# Patient Record
Sex: Female | Born: 1955
Health system: Southern US, Community
[De-identification: ages and names within clinical notes are randomized; demographics above are authoritative.]

## PROBLEM LIST (undated history)

## (undated) DIAGNOSIS — I519 Heart disease, unspecified: Secondary | ICD-10-CM

## (undated) DIAGNOSIS — E785 Hyperlipidemia, unspecified: Secondary | ICD-10-CM

## (undated) DIAGNOSIS — E119 Type 2 diabetes mellitus without complications: Secondary | ICD-10-CM

## (undated) DIAGNOSIS — G8929 Other chronic pain: Secondary | ICD-10-CM

## (undated) DIAGNOSIS — M1990 Unspecified osteoarthritis, unspecified site: Secondary | ICD-10-CM

## (undated) DIAGNOSIS — C4499 Other specified malignant neoplasm of skin, unspecified: Secondary | ICD-10-CM

## (undated) DIAGNOSIS — D071 Carcinoma in situ of vulva: Secondary | ICD-10-CM

## (undated) DIAGNOSIS — M51369 Other intervertebral disc degeneration, lumbar region without mention of lumbar back pain or lower extremity pain: Secondary | ICD-10-CM

## (undated) DIAGNOSIS — I350 Nonrheumatic aortic (valve) stenosis: Secondary | ICD-10-CM

## (undated) DIAGNOSIS — Z87412 Personal history of vulvar dysplasia: Secondary | ICD-10-CM

## (undated) DIAGNOSIS — C519 Malignant neoplasm of vulva, unspecified: Secondary | ICD-10-CM

## (undated) DIAGNOSIS — D259 Leiomyoma of uterus, unspecified: Secondary | ICD-10-CM

## (undated) DIAGNOSIS — I10 Essential (primary) hypertension: Secondary | ICD-10-CM

## (undated) DIAGNOSIS — M5136 Other intervertebral disc degeneration, lumbar region: Secondary | ICD-10-CM

## (undated) HISTORY — PX: OTHER SURGICAL HISTORY: SHX169

---

## 2010-06-22 ENCOUNTER — Other Ambulatory Visit: Payer: Self-pay | Admitting: Internal Medicine

## 2010-06-22 DIAGNOSIS — Z1231 Encounter for screening mammogram for malignant neoplasm of breast: Secondary | ICD-10-CM

## 2010-06-29 ENCOUNTER — Ambulatory Visit
Admission: RE | Admit: 2010-06-29 | Discharge: 2010-06-29 | Disposition: A | Discharge status: Home / Self Care | Payer: Self-pay | Source: Ambulatory Visit | Attending: Internal Medicine | Admitting: Internal Medicine

## 2010-06-29 DIAGNOSIS — Z1231 Encounter for screening mammogram for malignant neoplasm of breast: Secondary | ICD-10-CM

## 2016-04-06 ENCOUNTER — Encounter: Payer: Self-pay | Admitting: Internal Medicine

## 2016-04-06 ENCOUNTER — Ambulatory Visit: Payer: Self-pay | Admitting: Internal Medicine

## 2016-04-06 ENCOUNTER — Ambulatory Visit: Payer: Self-pay | Attending: Internal Medicine | Admitting: Internal Medicine

## 2016-04-06 VITALS — BP 170/91 | HR 80 | Temp 98.4°F | Resp 16 | Wt 162.4 lb

## 2016-04-06 DIAGNOSIS — R102 Pelvic and perineal pain: Secondary | ICD-10-CM

## 2016-04-06 DIAGNOSIS — N765 Ulceration of vagina: Secondary | ICD-10-CM | POA: Insufficient documentation

## 2016-04-06 LAB — CBC WITH DIFFERENTIAL/PLATELET
BASOS PCT: 0 %
Basophils Absolute: 0 cells/uL (ref 0–200)
EOS PCT: 2 %
Eosinophils Absolute: 142 cells/uL (ref 15–500)
HCT: 40.8 % (ref 35.0–45.0)
HEMOGLOBIN: 12.5 g/dL (ref 11.7–15.5)
LYMPHS ABS: 3124 {cells}/uL (ref 850–3900)
LYMPHS PCT: 44 %
MCH: 23.9 pg — ABNORMAL LOW (ref 27.0–33.0)
MCHC: 30.6 g/dL — AB (ref 32.0–36.0)
MCV: 78.2 fL — ABNORMAL LOW (ref 80.0–100.0)
MPV: 10 fL (ref 7.5–12.5)
Monocytes Absolute: 426 cells/uL (ref 200–950)
Monocytes Relative: 6 %
NEUTROS PCT: 48 %
Neutro Abs: 3408 cells/uL (ref 1500–7800)
Platelets: 313 10*3/uL (ref 140–400)
RBC: 5.22 MIL/uL — AB (ref 3.80–5.10)
RDW: 14.9 % (ref 11.0–15.0)
WBC: 7.1 10*3/uL (ref 3.8–10.8)

## 2016-04-06 LAB — COMPLETE METABOLIC PANEL WITH GFR
ALT: 15 U/L (ref 6–29)
AST: 21 U/L (ref 10–35)
Albumin: 4.2 g/dL (ref 3.6–5.1)
Alkaline Phosphatase: 62 U/L (ref 33–130)
BUN: 8 mg/dL (ref 7–25)
CHLORIDE: 102 mmol/L (ref 98–110)
CO2: 31 mmol/L (ref 20–31)
CREATININE: 0.61 mg/dL (ref 0.50–0.99)
Calcium: 9.7 mg/dL (ref 8.6–10.4)
GFR, Est African American: >89 mL/min (ref >=60)
GFR, Est Non African American: >89 mL/min (ref >=60)
GLUCOSE: 95 mg/dL (ref 65–99)
POTASSIUM: 3.9 mmol/L (ref 3.5–5.3)
SODIUM: 141 mmol/L (ref 135–146)
Total Bilirubin: 0.5 mg/dL (ref 0.2–1.2)
Total Protein: 7.5 g/dL (ref 6.1–8.1)

## 2016-04-06 LAB — POCT GLYCOSYLATED HEMOGLOBIN (HGB A1C): HEMOGLOBIN A1C: 5.7

## 2016-04-06 LAB — TSH: TSH: 0.97 m[IU]/L

## 2016-04-06 LAB — LIPID PANEL
CHOL/HDL RATIO: 4.9 ratio (ref <5.0)
Cholesterol: 226 mg/dL — ABNORMAL HIGH (ref <200)
HDL: 46 mg/dL — ABNORMAL LOW (ref >50)
LDL CALC: 143 mg/dL — AB (ref <100)
Triglycerides: 184 mg/dL — ABNORMAL HIGH (ref <150)
VLDL: 37 mg/dL — AB (ref <30)

## 2016-04-06 MED ORDER — FLUCONAZOLE 200 MG PO TABS: 200.0000 mg | ORAL_TABLET | Freq: Once | ORAL | 1 refills | Status: AC

## 2016-04-06 MED ORDER — DOXYCYCLINE HYCLATE 100 MG PO TABS: 100.0000 mg | ORAL_TABLET | Freq: Two times a day (BID) | ORAL | 0 refills | Status: DC

## 2016-04-06 MED ORDER — ACYCLOVIR 5 % EX OINT: 1.0000 "application " | TOPICAL_OINTMENT | CUTANEOUS | 3 refills | Status: DC

## 2016-04-06 MED ORDER — ACYCLOVIR 400 MG PO TABS
400.0000 mg | ORAL_TABLET | Freq: Every day | ORAL | 0 refills | Status: DC
Start: 2016-04-06 — End: 2016-05-17

## 2016-04-06 MED ORDER — ACETAMINOPHEN-CODEINE #3 300-30 MG PO TABS
1.0000 | ORAL_TABLET | ORAL | 0 refills | Status: DC | PRN
Start: 2016-04-06 — End: 2016-04-22

## 2016-04-06 MED FILL — FLUCONAZOLE 200 MG TABLET: 200 | 1 days supply | Qty: 1 | Fill #0

## 2016-04-06 MED FILL — ?ACYCLOVIR 400 MG TABLET: 400 | 30 days supply | Qty: 70 | Fill #0

## 2016-04-06 MED FILL — ACETAMINOPHEN/COD #3 TABLET: 300-30 | 30 days supply | Qty: 60 | Fill #0

## 2016-04-06 MED FILL — ?DOXYCYCLINE MONO 100 MG TA: 100 | 20 days supply | Qty: 20 | Fill #0

## 2016-04-06 NOTE — Progress Notes (Signed)
Pt is in the office today for new patient visit Pt states she has a a sore on her vagina Pt states it is very painful Pt states her pain level is a 10 Pt states it has been 2 years  Pt states there is a little discharge

## 2016-04-06 NOTE — Progress Notes (Signed)
Ellen Ryan, is a 60 y.o. female  [id number]  [id number]  DOB - 1955/10/14  CC:  Chief Complaint  Patient presents with  . New Patient (Initial Visit)       HPI: Ellen Ryan is a 60 y.o. female with no significant medical history, arrived [place] from [place] few days ago to visit her daughter came here today to establish medical care. Accompanied by her daughter who lives here in [place] and a Designer, jewellery. Her major complain is severe pain, 10/10 around the vagina and vulva for over a year. Many tests and treatments carried out in [place] with no significant improvement. She said she had a biopsy done and was told she has vaginitis. Pain is so severe that she is scared to urinate because anything that touches causes intense pain. She has no bleeding, no significant vaginal discharge, no swelling or bumps, no inguinal fullness. No abdominal pain or distension, no fever at anytime, no night sweat, no weight loss. She is post menopausal. She was once treated with acyclovir but no relieve. Patient has No headache, No chest pain, No abdominal pain - No Nausea, No new weakness tingling or numbness, No Cough - SOB. She is not on any medication chronically. She does not smoke cigarette, no alcohol use.    Not on File No past medical history on file. No current outpatient prescriptions on file prior to visit.   No current facility-administered medications on file prior to visit.    No family history on file. Social History   Social History  . Marital status: Unknown    Spouse name: N/A  . Number of children: N/A  . Years of education: N/A   Occupational History  . Not on file.   Social History Main Topics  . Smoking status: Never Smoker  . Smokeless tobacco: Not on file  . Alcohol use Not on file  . Drug use: Unknown  . Sexual activity: Not on file   Other Topics Concern  . Not on file   Social History Narrative  . No narrative on file    Review of  Systems: Constitutional: Negative for fever, chills, diaphoresis, activity change, appetite change and fatigue. HENT: Negative for ear pain, nosebleeds, congestion, facial swelling, rhinorrhea, neck pain, neck stiffness and ear discharge.  Eyes: Negative for pain, discharge, redness, itching and visual disturbance. Respiratory: Negative for cough, choking, chest tightness, shortness of breath, wheezing and stridor.  Cardiovascular: Negative for chest pain, palpitations and leg swelling. Gastrointestinal: Negative for abdominal distention. Genitourinary: vulva and vaginal pain and ulcers Musculoskeletal: Negative for back pain, joint swelling, arthralgia and gait problem. Neurological: Negative for dizziness, tremors, seizures, syncope, facial asymmetry, speech difficulty, weakness, light-headedness, numbness and headaches.  Hematological: Negative for adenopathy. Does not bruise/bleed easily. Psychiatric/Behavioral: Negative for hallucinations, behavioral problems, confusion, dysphoric mood, decreased concentration and agitation.    Objective:   Vitals:   04/06/16 0918  BP: (!) 170/91  Pulse: 80  Resp: 16  Temp: 98.4 F (36.9 C)    Physical Exam: Constitutional: Patient appears well-developed and well-nourished. No distress. HENT: Normocephalic, atraumatic, External right and left ear normal. Oropharynx is clear and moist.  Eyes: Conjunctivae and EOM are normal. PERRLA, no scleral icterus. Neck: Normal ROM. Neck supple. No JVD. No tracheal deviation. No thyromegaly. CVS: RRR, S1/S2 +, no murmurs, no gallops, no carotid bruit.  Pulmonary: Effort and breath sounds normal, no stridor, rhonchi, wheezes, rales.  Abdominal: Soft. BS +, no distension, tenderness, rebound or guarding.  Musculoskeletal: Normal range of motion. No edema and no tenderness.  Lymphadenopathy: No lymphadenopathy noted, cervical, inguinal or axillary Neuro: Alert. Normal reflexes, muscle tone coordination. No  cranial nerve deficit. Skin: Skin is warm and dry. No rash noted. Not diaphoretic. No erythema. No pallor. Psychiatric: Normal mood and affect. Behavior, judgment, thought content normal.  Pelvic Exam: Female external genitalia, appears bumpy, exquisite tenderness even on light touch, impossible to do digital examination, vulva appeared to be adhesive to each other, when slightly opened, extensive ulceration noted on both sides internally, no discharge or active bleeding.      Assessment and plan:   1. Vaginal ulceration  - CT ABDOMEN PELVIS W WO CONTRAST; Future - CBC with Differential/Platelet - COMPLETE METABOLIC PANEL WITH GFR - POCT glycosylated hemoglobin (Hb A1C) - Lipid panel - TSH - Urinalysis, Complete - ANA - STD Panel (HBSAG,HIV,RPR) - Cervicovaginal ancillary only - acyclovir (ZOVIRAX) 400 MG tablet; Take 1 tablet (400 mg total) by mouth 5 (five) times daily.  Dispense: 70 tablet; Refill: 0 - acyclovir ointment (ZOVIRAX) 5 %; Apply 1 application topically every 4 (four) hours.  Dispense: 30 g; Refill: 3 - fluconazole (DIFLUCAN) 200 MG tablet; Take 1 tablet (200 mg total) by mouth once. Repeat in one week  Dispense: 1 tablet; Refill: 1 - doxycycline (VIBRA-TABS) 100 MG tablet; Take 1 tablet (100 mg total) by mouth 2 (two) times daily.  Dispense: 20 tablet; Refill: 0 - acetaminophen-codeine (TYLENOL #3) 300-30 MG tablet; Take 1 tablet by mouth every 4 (four) hours as needed.  Dispense: 60 tablet; Refill: 0  2. Vaginal pain  - CT ABDOMEN PELVIS W WO CONTRAST; Future - CBC with Differential/Platelet - COMPLETE METABOLIC PANEL WITH GFR - POCT glycosylated hemoglobin (Hb A1C) - Lipid panel - TSH - Urinalysis, Complete - ANA - STD Panel (HBSAG,HIV,RPR) - Cervicovaginal ancillary only - acyclovir (ZOVIRAX) 400 MG tablet; Take 1 tablet (400 mg total) by mouth 5 (five) times daily.  Dispense: 70 tablet; Refill: 0 - acyclovir ointment (ZOVIRAX) 5 %; Apply 1 application  topically every 4 (four) hours.  Dispense: 30 g; Refill: 3 - fluconazole (DIFLUCAN) 200 MG tablet; Take 1 tablet (200 mg total) by mouth once. Repeat in one week  Dispense: 1 tablet; Refill: 1 - doxycycline (VIBRA-TABS) 100 MG tablet; Take 1 tablet (100 mg total) by mouth 2 (two) times daily.  Dispense: 20 tablet; Refill: 0 - acetaminophen-codeine (TYLENOL #3) 300-30 MG tablet; Take 1 tablet by mouth every 4 (four) hours as needed.  Dispense: 60 tablet; Refill: 0   Return in about 4 weeks (around 05/04/2016) for Follow up Pain and Ulcers.  The patient was given clear instructions to go to ER or return to medical center if symptoms don't improve, worsen or new problems develop. The patient verbalized understanding. The patient was told to call to get lab results if they haven't heard anything in the next week.     This note has been created with Surveyor, quantity. Any transcriptional errors are unintentional.    Angelica Chessman, MD, [place], Karilyn C[place], [place], [place]   04/06/2016, 10:14 AM

## 2016-04-07 LAB — CERVICOVAGINAL ANCILLARY ONLY: Wet Prep (BD Affirm): NEGATIVE

## 2016-04-07 LAB — URINALYSIS, COMPLETE
Bacteria, UA: NONE SEEN [HPF]
Bilirubin Urine: NEGATIVE
CASTS: NONE SEEN [LPF]
CRYSTALS: NONE SEEN [HPF]
Glucose, UA: NEGATIVE
Hgb urine dipstick: NEGATIVE
KETONES UR: NEGATIVE
Nitrite: NEGATIVE
Protein, ur: NEGATIVE
SPECIFIC GRAVITY, URINE: 1.013 (ref 1.001–1.035)
YEAST: NONE SEEN [HPF]
pH: 6 (ref 5.0–8.0)

## 2016-04-07 LAB — STD PANEL
HIV: NONREACTIVE
Hepatitis B Surface Ag: NEGATIVE

## 2016-04-07 LAB — ANA: ANA: NEGATIVE

## 2016-04-10 LAB — REFLEX ADENOVIRUS CULTURE

## 2016-04-10 LAB — RFX HSV/VARICELLA ZOSTER RAPID CULT

## 2016-04-11 ENCOUNTER — Ambulatory Visit (HOSPITAL_COMMUNITY)
Admission: RE | Admit: 2016-04-11 | Discharge: 2016-04-11 | Disposition: A | Discharge status: Home / Self Care | Payer: Self-pay | Source: Ambulatory Visit | Attending: Internal Medicine | Admitting: Internal Medicine

## 2016-04-11 ENCOUNTER — Encounter (HOSPITAL_COMMUNITY): Payer: Self-pay

## 2016-04-11 DIAGNOSIS — R102 Pelvic and perineal pain: Secondary | ICD-10-CM | POA: Insufficient documentation

## 2016-04-11 DIAGNOSIS — D259 Leiomyoma of uterus, unspecified: Secondary | ICD-10-CM | POA: Insufficient documentation

## 2016-04-11 DIAGNOSIS — M5136 Other intervertebral disc degeneration, lumbar region: Secondary | ICD-10-CM | POA: Insufficient documentation

## 2016-04-11 DIAGNOSIS — N765 Ulceration of vagina: Secondary | ICD-10-CM | POA: Insufficient documentation

## 2016-04-11 MED ORDER — IOPAMIDOL (ISOVUE-300) INJECTION 61%
INTRAVENOUS | Status: AC
  Administered 2016-04-11: 100 mL
  Filled 2016-04-11: qty 75

## 2016-04-14 ENCOUNTER — Other Ambulatory Visit: Payer: Self-pay | Admitting: Internal Medicine

## 2016-04-14 DIAGNOSIS — N765 Ulceration of vagina: Secondary | ICD-10-CM

## 2016-04-14 DIAGNOSIS — R102 Pelvic and perineal pain: Secondary | ICD-10-CM

## 2016-04-14 MED ORDER — FLUCONAZOLE 200 MG PO TABS: ORAL_TABLET | ORAL | 0 refills | Status: DC

## 2016-04-14 MED ORDER — DOXYCYCLINE HYCLATE 100 MG PO TABS: 100.0000 mg | ORAL_TABLET | Freq: Two times a day (BID) | ORAL | 0 refills | Status: AC

## 2016-04-14 MED FILL — ?DOXYCYCLINE MONO 100 MG TA: 100 | 32 days supply | Qty: 64 | Fill #0

## 2016-04-14 MED FILL — FLUCONAZOLE 200 MG TABLET: 200 | 14 days supply | Qty: 2 | Fill #0

## 2016-04-16 LAB — CYTOMEGALOVIRUS CULTURE

## 2016-04-16 LAB — VIRAL CULTURE VIRC

## 2016-04-21 ENCOUNTER — Telehealth: Payer: Self-pay | Admitting: *Deleted

## 2016-04-21 NOTE — Telephone Encounter (Signed)
Patient verified DOB Patients daughter is aware of culture being negative including herpes. Patient advised to continue taking doxycycline and FU as scheduled. Patients daughter expressed her understanding and had no further questions at this time.

## 2016-04-21 NOTE — Telephone Encounter (Signed)
-----   Message from Tresa Garter, MD sent at 04/21/2016 10:31 AM EST ----- Please inform the patient that all viral culture are negative including herpes. Continue Doxycycline and follow up as scheduled

## 2016-04-22 ENCOUNTER — Other Ambulatory Visit: Payer: Self-pay | Admitting: Internal Medicine

## 2016-04-22 MED ORDER — CLOBETASOL PROPIONATE 0.05 % EX OINT: 1.0000 "application " | TOPICAL_OINTMENT | Freq: Two times a day (BID) | CUTANEOUS | 0 refills | Status: DC

## 2016-04-22 MED ORDER — PREDNISONE 10 MG (48) PO TBPK: ORAL_TABLET | Freq: Every day | ORAL | 0 refills | Status: DC

## 2016-04-22 MED ORDER — ACETAMINOPHEN-CODEINE #3 300-30 MG PO TABS: 1.0000 | ORAL_TABLET | ORAL | 0 refills | Status: DC | PRN

## 2016-04-22 NOTE — Addendum Note (Signed)
Addended by: Angelica Chessman E on: 04/22/2016 03:51 PM   Modules accepted: Orders

## 2016-05-10 ENCOUNTER — Ambulatory Visit: Payer: Self-pay | Admitting: Internal Medicine

## 2016-05-11 ENCOUNTER — Encounter (HOSPITAL_COMMUNITY): Payer: Self-pay | Admitting: Emergency Medicine

## 2016-05-11 ENCOUNTER — Emergency Department (HOSPITAL_COMMUNITY)
Admission: EM | Admit: 2016-05-11 | Discharge: 2016-05-11 | Disposition: A | Discharge status: Home / Self Care | Payer: Self-pay | Attending: Emergency Medicine | Admitting: Emergency Medicine

## 2016-05-11 DIAGNOSIS — N765 Ulceration of vagina: Secondary | ICD-10-CM | POA: Insufficient documentation

## 2016-05-11 HISTORY — DX: Hyperlipidemia, unspecified: E78.5

## 2016-05-11 LAB — URINALYSIS, ROUTINE W REFLEX MICROSCOPIC
BACTERIA UA: NONE SEEN
BILIRUBIN URINE: NEGATIVE
Glucose, UA: NEGATIVE mg/dL
HGB URINE DIPSTICK: NEGATIVE
KETONES UR: NEGATIVE mg/dL
NITRITE: NEGATIVE
Protein, ur: NEGATIVE mg/dL
SQUAMOUS EPITHELIAL / LPF: NONE SEEN
Specific Gravity, Urine: 1.009 (ref 1.005–1.030)
pH: 5 (ref 5.0–8.0)

## 2016-05-11 MED ORDER — OXYCODONE HCL 5 MG PO TABS
5.0000 mg | ORAL_TABLET | Freq: Once | ORAL | Status: AC
  Administered 2016-05-11: 5 mg via ORAL
  Filled 2016-05-11: qty 1

## 2016-05-11 MED ORDER — ACETAMINOPHEN 500 MG PO TABS
1000.0000 mg | ORAL_TABLET | Freq: Once | ORAL | Status: AC
  Administered 2016-05-11: 1000 mg via ORAL
  Filled 2016-05-11: qty 2

## 2016-05-11 MED ORDER — KETOROLAC TROMETHAMINE 60 MG/2ML IM SOLN
30.0000 mg | Freq: Once | INTRAMUSCULAR | Status: AC
  Administered 2016-05-11: 30 mg via INTRAMUSCULAR
  Filled 2016-05-11: qty 2

## 2016-05-11 NOTE — ED Provider Notes (Signed)
[facility] DEPT Provider Note   CSN: [id number] Arrival date & time: 05/11/16  1434     History   Chief Complaint Chief Complaint  Patient presents with  . Vaginal Pain  . Dysuria    HPI Ellen Ryan is a 61 y.o. female.  61 yo F with a chief complaints of vaginal pain. This been going on for at least 2 years. Family thinks it's worsening over the past week or so. She recently saw a family physician had a CT scan of the abdomen and pelvis with contrast that was negative. Had labs drawn with no significant abnormality. Per the patient in [place] she had been evaluated and had a biopsy done and they decided that it was caused by bacteria. They said that she had vaginitis. Family feels that the rash is worsening and now spreading to her perineum.    The history is provided by the patient and a relative. The history is limited by a language barrier.  Vaginal Pain  This is a chronic problem. The current episode started more than 1 week ago (2 years ago). The problem occurs constantly. The problem has been gradually worsening. Pertinent negatives include no chest pain, no headaches and no shortness of breath. Nothing aggravates the symptoms. Nothing relieves the symptoms. She has tried nothing for the symptoms. The treatment provided no relief.  Dysuria   Pertinent negatives include no chills, no nausea, no vomiting and no urgency.    Past Medical History:  Diagnosis Date  . Hyperlipidemia     Patient Active Problem List   Diagnosis Date Noted  . Vaginal ulceration 04/06/2016    History reviewed. No pertinent surgical history.  OB History    No data available       Home Medications    Prior to Admission medications   Medication Sig Start Date End Date Taking? Authorizing Provider  acetaminophen-codeine (TYLENOL #3) 300-30 MG tablet Take 1 tablet by mouth every 4 (four) hours as needed. 04/22/16  Yes Tresa Garter, MD  doxycycline (VIBRA-TABS) 100 MG tablet  Take 1 tablet (100 mg total) by mouth 2 (two) times daily. 04/14/16 05/16/16 Yes Tresa Garter, MD  ibuprofen (ADVIL,MOTRIN) 200 MG tablet Take 600 mg by mouth every 6 (six) hours as needed.   Yes Historical Provider, MD  acyclovir (ZOVIRAX) 400 MG tablet Take 1 tablet (400 mg total) by mouth 5 (five) times daily. Patient not taking: Reported on 05/11/2016 04/06/16   Tresa Garter, MD  acyclovir ointment (ZOVIRAX) 5 % Apply 1 application topically every 4 (four) hours. Patient not taking: Reported on 05/11/2016 04/06/16   Tresa Garter, MD  clobetasol ointment (TEMOVATE) [id number] % Apply 1 application topically 2 (two) times daily. Patient not taking: Reported on 05/11/2016 04/22/16   Tresa Garter, MD  fluconazole (DIFLUCAN) 200 MG tablet Take 1 tablet today, repeat in one two weeks Patient not taking: Reported on 05/11/2016 04/14/16   Tresa Garter, MD  predniSONE (STERAPRED UNI-PAK 48 TAB) 10 MG (48) TBPK tablet Take by mouth daily. 6 tabs daily x 3 days, 4 tabs daily x 3 days, 3 tablets daily for 3 days, then 2 tabs for 3 days then 1 tab daily Patient not taking: Reported on 05/11/2016 04/22/16   Tresa Garter, MD    Family History History reviewed. No pertinent family history.  Social History Social History  Substance Use Topics  . Smoking status: Never Smoker  . Smokeless tobacco: Never Used  . Alcohol  use No     Allergies   Patient has no known allergies.   Review of Systems Review of Systems  Constitutional: Negative for chills and fever.  HENT: Negative for congestion and rhinorrhea.   Eyes: Negative for redness and visual disturbance.  Respiratory: Negative for shortness of breath and wheezing.   Cardiovascular: Negative for chest pain and palpitations.  Gastrointestinal: Negative for nausea and vomiting.  Genitourinary: Positive for pelvic pain and vaginal pain. Negative for dysuria, urgency, vaginal bleeding and vaginal discharge.    Musculoskeletal: Negative for arthralgias and myalgias.  Skin: Negative for pallor and wound.  Neurological: Negative for dizziness and headaches.     Physical Exam Updated Vital Signs BP 181/94   Pulse 107   Temp 98.2 F (36.8 C) (Oral)   Resp 16   Ht 5' (1.524 m)   Wt 154 lb 5.2 oz (70 kg)   SpO2 96%   BMI 30.14 kg/m   Physical Exam  Constitutional: She is oriented to person, place, and time. She appears well-developed and well-nourished. No distress.  HENT:  Head: Normocephalic and atraumatic.  Eyes: EOM are normal. Pupils are equal, round, and reactive to light.  Neck: Normal range of motion. Neck supple.  Cardiovascular: Normal rate and regular rhythm.  Exam reveals no gallop and no friction rub.   No murmur heard. Pulmonary/Chest: Effort normal. She has no wheezes. She has no rales.  Abdominal: Soft. She exhibits no distension. There is no tenderness.  Genitourinary:  Genitourinary Comments: Diffuse vaginal ulceration, unable to insert speculum due to pain.   Musculoskeletal: She exhibits no edema or tenderness.  Neurological: She is alert and oriented to person, place, and time.  Skin: Skin is warm and dry. She is not diaphoretic.  Psychiatric: She has a normal mood and affect. Her behavior is normal.  Nursing note and vitals reviewed.    ED Treatments / Results  Labs (all labs ordered are listed, but only abnormal results are displayed) Labs Reviewed  URINALYSIS, ROUTINE W REFLEX MICROSCOPIC - Abnormal; Notable for the following:       Result Value   Leukocytes, UA TRACE (*)    All other components within normal limits  WET PREP, GENITAL  GC/CHLAMYDIA PROBE AMP ([place]) NOT AT [facility]    EKG  EKG Interpretation None       Radiology No results found.  Procedures Procedures (including critical care time)  Medications Ordered in ED Medications  ketorolac (TORADOL) injection 30 mg (not administered)  acetaminophen (TYLENOL) tablet 1,000 mg  (not administered)  oxyCODONE (Oxy IR/ROXICODONE) immediate release tablet 5 mg (not administered)     Initial Impression / Assessment and Plan / ED Course  I have reviewed the triage vital signs and the nursing notes.  Pertinent labs & imaging results that were available during my care of the patient were reviewed by me and considered in my medical decision making (see chart for details).     61 yo F With chronic vaginal pain. Patient had a recent CT that was normal. Diffuse eroded ulcerations. Spares the rectum and the mucus numbering the mouth. Was recently on doxycycline. This been going on for 2 years I doubt Stevens-Johnson's. We'll have the patient follow-up with OB/GYN.  5:40 PM:  I have discussed the diagnosis/risks/treatment options with the patient and family and believe the pt to be eligible for discharge home to follow-up with OB/GYN. We also discussed returning to the ED immediately if new or worsening sx occur. We  discussed the sx which are most concerning (e.g., sudden worsening pain, fever, inability to tolerate by mouth ) that necessitate immediate return. Medications administered to the patient during their visit and any new prescriptions provided to the patient are listed below.  Medications given during this visit Medications  ketorolac (TORADOL) injection 30 mg (not administered)  acetaminophen (TYLENOL) tablet 1,000 mg (not administered)  oxyCODONE (Oxy IR/ROXICODONE) immediate release tablet 5 mg (not administered)     The patient appears reasonably screen and/or stabilized for discharge and I doubt any other medical condition or other [facility] requiring further screening, evaluation, or treatment in the ED at this time prior to discharge.    Final Clinical Impressions(s) / ED Diagnoses   Final diagnoses:  Vaginal ulceration    New Prescriptions New Prescriptions   No medications on file     Deno Etienne, DO 05/11/16 1740

## 2016-05-11 NOTE — ED Triage Notes (Addendum)
Pt reports vaginal pain for the past 3 weeks. Went to doctor for same a few weeks ago and was given prescription for abx. However pain and swelling is spreading to perianal area now. Pt has also had dysuria.

## 2016-05-11 NOTE — ED Notes (Signed)
Patient would not allow Dr. Tyrone Nine to perform pelvic exam due to pain.

## 2016-05-11 NOTE — Discharge Instructions (Signed)
Take 4 over the counter ibuprofen tablets 3 times a day or 2 over-the-counter naproxen tablets twice a day for pain. Also take tylenol 1000mg(2 extra strength) four times a day.    

## 2016-05-17 ENCOUNTER — Ambulatory Visit: Payer: Self-pay | Attending: Internal Medicine | Admitting: Internal Medicine

## 2016-05-17 VITALS — BP 177/97 | HR 82 | Temp 97.7°F | Resp 18 | Ht 60.0 in | Wt 167.6 lb

## 2016-05-17 DIAGNOSIS — R102 Pelvic and perineal pain: Secondary | ICD-10-CM | POA: Insufficient documentation

## 2016-05-17 DIAGNOSIS — E785 Hyperlipidemia, unspecified: Secondary | ICD-10-CM | POA: Insufficient documentation

## 2016-05-17 DIAGNOSIS — Z79899 Other long term (current) drug therapy: Secondary | ICD-10-CM | POA: Insufficient documentation

## 2016-05-17 DIAGNOSIS — I10 Essential (primary) hypertension: Secondary | ICD-10-CM | POA: Insufficient documentation

## 2016-05-17 DIAGNOSIS — N765 Ulceration of vagina: Secondary | ICD-10-CM | POA: Insufficient documentation

## 2016-05-17 MED ORDER — MELOXICAM 15 MG PO TABS
15.0000 mg | ORAL_TABLET | Freq: Every day | ORAL | 0 refills | Status: DC
Start: 2016-05-17 — End: 2016-05-17

## 2016-05-17 MED ORDER — MELOXICAM 15 MG PO TABS: 15.0000 mg | ORAL_TABLET | Freq: Every day | ORAL | 3 refills | Status: DC

## 2016-05-17 NOTE — Progress Notes (Signed)
Ellen Ryan, is a 61 y.o. female  [id number]  [id number]  DOB - Apr 13, 1956  Chief Complaint  Patient presents with  . Hypertension      Subjective:   Ellen Ryan is a 61 y.o. female here today for a follow up visit of vaginal pain and ED visit follow up. She complains of ongoing severe vaginal pain and ulcer which has been going on for years. Has been treated with rounds of antibiotics, treated for herpes, treated for fungal infection and lately was given a tapered course of steroid with presumptive diagnosis of Behcet's Disease. There was no sustained relieve. Denies vaginal bleeding. No discharge. Pain is worse when urine touches. Patient has No headache, No chest pain, No abdominal pain - No Nausea, No new weakness tingling or numbness, No Cough - SOB. She was never hypertensive, not on any chronic medications. BP at home said to be normal per daughter who accompanied patient during this visit and a DNP student at [employer].  Problem  Vaginal Pain  Essential Hypertension    ALLERGIES: No Known Allergies  PAST MEDICAL HISTORY: Past Medical History:  Diagnosis Date  . Hyperlipidemia     MEDICATIONS AT HOME: Prior to Admission medications   Medication Sig Start Date End Date Taking? Authorizing Provider  acetaminophen-codeine (TYLENOL #3) 300-30 MG tablet Take 1 tablet by mouth every 4 (four) hours as needed. 04/22/16  Yes Tresa Garter, MD  ibuprofen (ADVIL,MOTRIN) 200 MG tablet Take 600 mg by mouth every 6 (six) hours as needed.   Yes Historical Provider, MD  meloxicam (MOBIC) 15 MG tablet Take 1 tablet (15 mg total) by mouth daily. 05/17/16  Yes Tresa Garter, MD    Objective:   Vitals:   05/17/16 1653  BP: (!) 177/97  Pulse: 82  Resp: 18  Temp: 97.7 F (36.5 C)  TempSrc: Oral  SpO2: 100%  Weight: 167 lb 9.6 oz (76 kg)  Height: 5' (1.524 m)   Exam General appearance : Awake, alert, not in any distress. Speech Clear. Not toxic  looking HEENT: Atraumatic and Normocephalic, pupils equally reactive to light and accomodation Neck: Supple, no JVD. No cervical lymphadenopathy.  Chest: Good air entry bilaterally, no added sounds  CVS: S1 S2 regular, no murmurs.  Abdomen: Bowel sounds present, Non tender and not distended with no gaurding, rigidity or rebound. Extremities: B/L Lower Ext shows no edema, both legs are warm to touch Neurology: Awake alert, and oriented X 3, CN II-XII intact, Non focal Skin: No Rash  Data Review Lab Results  Component Value Date   HGBA1C 5.7 04/06/2016    Assessment & Plan   1. Vaginal ulceration  - Ambulatory referral to Gynecology - meloxicam (MOBIC) 15 MG tablet; Take 1 tablet (15 mg total) by mouth daily.  Dispense: 30 tablet; Refill: 3  2. Vaginal pain  - Ambulatory referral to Gynecology - meloxicam (MOBIC) 15 MG tablet; Take 1 tablet (15 mg total) by mouth daily.  Dispense: 30 tablet; Refill: 3  3. Essential hypertension: Patient is not known to have HTN  We have discussed target BP range and blood pressure goal. I have advised patient to check BP regularly and to call us back or report to clinic if the numbers are consistently higher than 140/90. We discussed the importance of compliance with medical therapy and DASH diet recommended, consequences of uncontrolled hypertension discussed.   Patient have been counseled extensively about nutrition and exercise. Other issues discussed during this visit include: low cholesterol  diet, weight control and daily exercise, foot care, annual eye examinations at Ophthalmology, importance of adherence with medications and regular follow-up. We also discussed long term complications of uncontrolled diabetes and hypertension.   Return in about 3 months (around 08/15/2016) for Routine Follow Up.  The patient was given clear instructions to go to ER or return to medical center if symptoms don't improve, worsen or new problems develop. The  patient verbalized understanding. The patient was told to call to get lab results if they haven't heard anything in the next week.   This note has been created with Surveyor, quantity. Any transcriptional errors are unintentional.    Angelica Chessman, MD, [place], [place], [place], [place] and [place], [place]   05/17/2016, 7:13 PM

## 2016-05-26 ENCOUNTER — Encounter: Payer: Self-pay | Admitting: Obstetrics & Gynecology

## 2016-05-31 ENCOUNTER — Other Ambulatory Visit: Payer: Self-pay | Admitting: Internal Medicine

## 2016-05-31 MED ORDER — AMLODIPINE BESYLATE 5 MG PO TABS: 5.0000 mg | ORAL_TABLET | Freq: Every day | ORAL | 3 refills | Status: DC

## 2016-05-31 MED ORDER — BETAMETHASONE DIPROPIONATE 0.05 % EX CREA: TOPICAL_CREAM | Freq: Two times a day (BID) | CUTANEOUS | 0 refills | Status: DC

## 2016-05-31 MED FILL — ?AMLODIPINE BESYLATE 5 MG T: 5 | 30 days supply | Qty: 30 | Fill #0

## 2016-05-31 MED FILL — BETAMETHASONE DP 0.05% CRM: 0.05 | 30 days supply | Qty: 45 | Fill #0

## 2016-06-23 ENCOUNTER — Other Ambulatory Visit: Payer: Self-pay | Admitting: Internal Medicine

## 2016-06-23 MED ORDER — AMLODIPINE BESYLATE 10 MG PO TABS: 10.0000 mg | ORAL_TABLET | Freq: Every day | ORAL | 3 refills | Status: DC

## 2016-06-30 ENCOUNTER — Ambulatory Visit (INDEPENDENT_AMBULATORY_CARE_PROVIDER_SITE_OTHER): Payer: Self-pay | Admitting: Obstetrics & Gynecology

## 2016-06-30 VITALS — BP 167/100 | HR 120 | Wt 172.0 lb

## 2016-06-30 DIAGNOSIS — N766 Ulceration of vulva: Secondary | ICD-10-CM

## 2016-06-30 MED ORDER — LIDOCAINE HCL 2 % EX GEL
1.0000 | CUTANEOUS | 2 refills | Status: DC | PRN
Start: 2016-06-30 — End: 2016-12-27

## 2016-06-30 MED ORDER — OXYBUTYNIN CHLORIDE ER 5 MG PO TB24: 5.0000 mg | ORAL_TABLET | Freq: Every day | ORAL | 12 refills | Status: DC

## 2016-06-30 MED ORDER — OXYCODONE-ACETAMINOPHEN 5-325 MG PO TABS: 1.0000 | ORAL_TABLET | Freq: Four times a day (QID) | ORAL | 0 refills | Status: DC | PRN

## 2016-06-30 NOTE — Progress Notes (Signed)
   Subjective:    Patient ID: Ellen Ryan, female    DOB: 01-14-1956, 61 y.o.   MRN: [id number]  HPI 61 yo W [place] P6 (all vaginal deliveries) here with a 61 year h/o severe vulvar pain and ulceration. She had a biopsy in [place] and told that it was not malignant. She was treated for "vaginitis" there with cipro and rocephin as well as oral steroids. She has a large amount of urirnary urgency and frequency. In a 2 hour movie, she will have to pee about 10 times. She gets up about 3 times per night to void.   Review of Systems     Objective:   Physical Exam Pleasant [id number] Severe ulceration of entire inner vulva, urine visualized leaking with exam She was unable to open legs more than a very small amount for a short amount of time. In [place] they used general/sedation anesthesia to get her biopsy.       Assessment & Plan:  I feel that this is ulceration due to chronic wetness I will start ditropan but if this doesn't help then she will need a urologist I will prescribe lidocaine ointment and very sparingly she can use percocet #30 with RFs RTC 4 weeks

## 2016-07-14 ENCOUNTER — Encounter: Payer: Self-pay | Admitting: Obstetrics & Gynecology

## 2016-07-19 ENCOUNTER — Other Ambulatory Visit: Payer: Self-pay | Admitting: Internal Medicine

## 2016-07-19 MED ORDER — OXYCODONE-ACETAMINOPHEN 5-325 MG PO TABS: 1.0000 | ORAL_TABLET | Freq: Four times a day (QID) | ORAL | 0 refills | Status: DC | PRN

## 2016-07-24 ENCOUNTER — Ambulatory Visit: Payer: Self-pay | Admitting: Obstetrics & Gynecology

## 2016-07-26 ENCOUNTER — Ambulatory Visit (INDEPENDENT_AMBULATORY_CARE_PROVIDER_SITE_OTHER): Payer: Self-pay | Admitting: Obstetrics & Gynecology

## 2016-07-26 VITALS — BP 159/76 | HR 102 | Wt 171.0 lb

## 2016-07-26 DIAGNOSIS — N766 Ulceration of vulva: Secondary | ICD-10-CM

## 2016-07-26 MED ORDER — OXYBUTYNIN CHLORIDE ER 10 MG PO TB24: 10.0000 mg | ORAL_TABLET | Freq: Every day | ORAL | 12 refills | Status: DC

## 2016-07-26 MED ORDER — ESTRADIOL 0.1 MG/GM VA CREA
TOPICAL_CREAM | VAGINAL | 12 refills | Status: DC
Start: 2016-07-26 — End: 2016-07-28

## 2016-07-26 NOTE — Progress Notes (Signed)
   Subjective:    Patient ID: Ellen Ryan, female    DOB: 1955-06-29, 61 y.o.   MRN: [id number]  HPI 61 yo [place] P6 here for follow up her vulvar ulceration. I started her on ditropan 5 mg. She started to feel better after a few weeks. She only has to get up 1 time per night to void. She only has to void a few times during the day. She increased her ditropan to 10 mg last week and has has no side effects.   Review of Systems     Objective:   Physical Exam She is now able to open her legs.  Her labia majora appear normal The ulceration is greatly improved. However the inner labia still have some ulceration. Urine is no longer obvious on the outside of the vagina.       Assessment & Plan:  Improving ulceration. Rec A&D ointment to provide a moisture barrier. Rec vaginal estrogen cream, generic estrace prescribed If no better in 2 months, then refer to Dr. Maryland Pink

## 2016-07-28 ENCOUNTER — Telehealth: Payer: Self-pay | Admitting: General Practice

## 2016-07-28 MED ORDER — ESTRADIOL 0.1 MG/GM VA CREA: TOPICAL_CREAM | VAGINAL | 12 refills | Status: DC

## 2016-07-28 NOTE — Telephone Encounter (Signed)
Patient's daughter called and left message stating the prescription for estrace is not at her mother's [place]. Per chart review, medication didn't go through electronically. Resubmitted. Called patient's daughter and informed her of Rx sent to pharmacy. She verbalized understanding & had no questions

## 2016-08-22 ENCOUNTER — Telehealth: Payer: Self-pay | Admitting: *Deleted

## 2016-08-22 NOTE — Telephone Encounter (Signed)
Called patient 3 times and line was busy. Will attempt to call patient back later

## 2016-08-22 NOTE — Telephone Encounter (Signed)
Talked to patient daughter Jari Favre, she stated mother condition was not getting better and was doing everything Dr.Dove told her to do at home with ointment and medication. Our office was able to work her in to see Dr. Hulan Fray tomorrow at 2:00 pm.

## 2016-08-22 NOTE — Telephone Encounter (Signed)
Per left message yesterday stating that her mother's condition is getting worse since starting the Estrogen cream and she wants Dr. Hulan Fray to know. The pain is more severe and is not relieved by the Oxycodone. She wants to know if she can get an earlier appt for her mother.

## 2016-08-23 ENCOUNTER — Encounter (HOSPITAL_COMMUNITY): Payer: Self-pay

## 2016-08-23 ENCOUNTER — Ambulatory Visit (INDEPENDENT_AMBULATORY_CARE_PROVIDER_SITE_OTHER): Payer: Self-pay | Admitting: Obstetrics & Gynecology

## 2016-08-23 VITALS — BP 153/92 | HR 100 | Wt 173.0 lb

## 2016-08-23 DIAGNOSIS — N766 Ulceration of vulva: Secondary | ICD-10-CM

## 2016-08-23 MED ORDER — TRAMADOL HCL 50 MG PO TABS: 50.0000 mg | ORAL_TABLET | Freq: Four times a day (QID) | ORAL | 0 refills | Status: DC | PRN

## 2016-08-23 NOTE — Progress Notes (Signed)
   Subjective:    Patient ID: Ellen Ryan, female    DOB: [birth year], 61 y.o.   MRN: [id number]  HPI 61 yo [place] lady here because she feels that the burning pain of her vulva got much worse after using the estrace cream. She stopped using it about a week ago. It is still super painful. She has used a total of #90 percocet in the last month and is out. She is still using the A&D ointment as well as the lidocaine.   Review of Systems     Objective:   Physical Exam        Assessment & Plan:  I will take her to the OR for a biopsy and then decide if I should refer her to Dr. Daniel Nones (gyn derm) versus Dr. [place] Pink Tramadol #60. 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

## 2016-08-29 NOTE — Patient Instructions (Signed)
Your procedure is scheduled on:  Friday, Sep 01, 2016  Enter through the [employer] [facility] at:  10:30AM  Pick up the phone at the desk and dial [phone].  Call this number if you have problems the morning of surgery: [phone].  Remember: Do NOT eat food:  After Midnight Thursday  Do NOT drink clear liquids after:  8:00 AM day of surgery  Take these medicines the morning of surgery with a SIP OF WATER:  Amlodipine  Stop ALL herbal medications and fish oil at this time  Do NOT smoke the day of surgery.  Do NOT wear jewelry (body piercing), metal hair clips/bobby pins, make-up, or nail polish. Do NOT wear lotions, powders, or perfumes.  You may wear deodorant. Do NOT shave for 48 hours prior to surgery. Do NOT bring valuables to the hospital. Contacts, dentures, or bridgework may not be worn into surgery.  Have a responsible adult drive you home and stay with you for 24 hours after your procedure  Bring a copy of your healthcare power of attorney and living will documents.

## 2016-08-30 ENCOUNTER — Encounter (HOSPITAL_COMMUNITY): Payer: Self-pay

## 2016-08-30 ENCOUNTER — Other Ambulatory Visit: Payer: Self-pay

## 2016-08-30 ENCOUNTER — Encounter (HOSPITAL_COMMUNITY)
Admission: RE | Admit: 2016-08-30 | Discharge: 2016-08-30 | Disposition: A | Discharge status: Home / Self Care | Payer: Self-pay | Source: Ambulatory Visit | Attending: Obstetrics & Gynecology | Admitting: Obstetrics & Gynecology

## 2016-08-30 DIAGNOSIS — Z01810 Encounter for preprocedural cardiovascular examination: Secondary | ICD-10-CM | POA: Insufficient documentation

## 2016-08-30 DIAGNOSIS — R000 Tachycardia, unspecified: Secondary | ICD-10-CM | POA: Insufficient documentation

## 2016-08-30 DIAGNOSIS — R102 Pelvic and perineal pain: Secondary | ICD-10-CM | POA: Insufficient documentation

## 2016-08-30 DIAGNOSIS — Z01818 Encounter for other preprocedural examination: Secondary | ICD-10-CM | POA: Insufficient documentation

## 2016-08-30 HISTORY — DX: Unspecified osteoarthritis, unspecified site: M19.90

## 2016-08-30 HISTORY — DX: Essential (primary) hypertension: I10

## 2016-08-30 LAB — BASIC METABOLIC PANEL
Anion gap: 10 (ref 5–15)
BUN: 12 mg/dL (ref 6–20)
CHLORIDE: 98 mmol/L — AB (ref 101–111)
CO2: 26 mmol/L (ref 22–32)
CREATININE: 0.6 mg/dL (ref 0.44–1.00)
Calcium: 9.7 mg/dL (ref 8.9–10.3)
GFR calc non Af Amer: >60 mL/min (ref >60)
Glucose, Bld: 176 mg/dL — ABNORMAL HIGH (ref 65–99)
POTASSIUM: 4 mmol/L (ref 3.5–5.1)
SODIUM: 134 mmol/L — AB (ref 135–145)

## 2016-08-30 LAB — CBC
HCT: 39 % (ref 36.0–46.0)
Hemoglobin: 12.6 g/dL (ref 12.0–15.0)
MCH: 23.7 pg — ABNORMAL LOW (ref 26.0–34.0)
MCHC: 32.3 g/dL (ref 30.0–36.0)
MCV: 73.4 fL — AB (ref 78.0–100.0)
PLATELETS: 292 10*3/uL (ref 150–400)
RBC: 5.31 MIL/uL — AB (ref 3.87–5.11)
RDW: 15 % (ref 11.5–15.5)
WBC: 9.1 10*3/uL (ref 4.0–10.5)

## 2016-09-01 ENCOUNTER — Ambulatory Visit (HOSPITAL_COMMUNITY): Payer: Self-pay | Admitting: Anesthesiology

## 2016-09-01 ENCOUNTER — Encounter (HOSPITAL_COMMUNITY)
Admission: RE | Disposition: A | Discharge status: Home / Self Care | Payer: Self-pay | Source: Ambulatory Visit | Attending: Obstetrics & Gynecology

## 2016-09-01 ENCOUNTER — Ambulatory Visit (HOSPITAL_COMMUNITY)
Admission: RE | Admit: 2016-09-01 | Discharge: 2016-09-01 | Disposition: A | Discharge status: Home / Self Care | Payer: Self-pay | Source: Ambulatory Visit | Attending: Obstetrics & Gynecology | Admitting: Obstetrics & Gynecology

## 2016-09-01 ENCOUNTER — Encounter (HOSPITAL_COMMUNITY): Payer: Self-pay

## 2016-09-01 DIAGNOSIS — E669 Obesity, unspecified: Secondary | ICD-10-CM | POA: Insufficient documentation

## 2016-09-01 DIAGNOSIS — R32 Unspecified urinary incontinence: Secondary | ICD-10-CM | POA: Insufficient documentation

## 2016-09-01 DIAGNOSIS — Z6836 Body mass index (BMI) 36.0-36.9, adult: Secondary | ICD-10-CM | POA: Insufficient documentation

## 2016-09-01 DIAGNOSIS — Z79899 Other long term (current) drug therapy: Secondary | ICD-10-CM | POA: Insufficient documentation

## 2016-09-01 DIAGNOSIS — N766 Ulceration of vulva: Secondary | ICD-10-CM | POA: Insufficient documentation

## 2016-09-01 DIAGNOSIS — C510 Malignant neoplasm of labium majus: Secondary | ICD-10-CM | POA: Insufficient documentation

## 2016-09-01 DIAGNOSIS — E785 Hyperlipidemia, unspecified: Secondary | ICD-10-CM | POA: Insufficient documentation

## 2016-09-01 DIAGNOSIS — I10 Essential (primary) hypertension: Secondary | ICD-10-CM | POA: Insufficient documentation

## 2016-09-01 SURGERY — EXAM UNDER ANESTHESIA
Anesthesia: General | Site: Vulva

## 2016-09-01 MED ORDER — FENTANYL CITRATE (PF) 100 MCG/2ML IJ SOLN
25.0000 ug | INTRAMUSCULAR | Status: DC | PRN
  Administered 2016-09-01: 50 ug via INTRAVENOUS

## 2016-09-01 MED ORDER — PROPOFOL 10 MG/ML IV BOLUS
INTRAVENOUS | Status: AC
  Filled 2016-09-01: qty 40

## 2016-09-01 MED ORDER — ONDANSETRON HCL 4 MG/2ML IJ SOLN
INTRAMUSCULAR | Status: AC
  Filled 2016-09-01: qty 2

## 2016-09-01 MED ORDER — ONDANSETRON HCL 4 MG/2ML IJ SOLN
INTRAMUSCULAR | Status: DC | PRN
  Administered 2016-09-01: 4 mg via INTRAVENOUS

## 2016-09-01 MED ORDER — BUPIVACAINE HCL (PF) 0.5 % IJ SOLN
INTRAMUSCULAR | Status: AC
  Filled 2016-09-01: qty 30

## 2016-09-01 MED ORDER — LACTATED RINGERS IV SOLN
INTRAVENOUS | Status: DC
  Administered 2016-09-01: 11:00:00 via INTRAVENOUS

## 2016-09-01 MED ORDER — FENTANYL CITRATE (PF) 100 MCG/2ML IJ SOLN
INTRAMUSCULAR | Status: AC
  Filled 2016-09-01: qty 2

## 2016-09-01 MED ORDER — PROPOFOL 500 MG/50ML IV EMUL
INTRAVENOUS | Status: DC | PRN
  Administered 2016-09-01: 50 ug/kg/min via INTRAVENOUS

## 2016-09-01 MED ORDER — PROPOFOL 500 MG/50ML IV EMUL
INTRAVENOUS | Status: DC | PRN
  Administered 2016-09-01: 60 mg via INTRAVENOUS
  Administered 2016-09-01: 40 mg via INTRAVENOUS
  Administered 2016-09-01: 100 mg via INTRAVENOUS

## 2016-09-01 MED ORDER — FENTANYL CITRATE (PF) 100 MCG/2ML IJ SOLN
INTRAMUSCULAR | Status: AC
  Administered 2016-09-01: 50 ug via INTRAVENOUS
  Filled 2016-09-01: qty 2

## 2016-09-01 MED ORDER — OXYCODONE-ACETAMINOPHEN 5-325 MG PO TABS: 1.0000 | ORAL_TABLET | Freq: Four times a day (QID) | ORAL | 0 refills | Status: DC | PRN

## 2016-09-01 MED ORDER — ONDANSETRON HCL 4 MG/2ML IJ SOLN: 4.0000 mg | Freq: Once | INTRAMUSCULAR | Status: DC | PRN

## 2016-09-01 MED ORDER — DEXAMETHASONE SODIUM PHOSPHATE 10 MG/ML IJ SOLN
INTRAMUSCULAR | Status: DC | PRN
  Administered 2016-09-01: 4 mg via INTRAVENOUS

## 2016-09-01 MED ORDER — LIDOCAINE HCL (CARDIAC) 20 MG/ML IV SOLN
INTRAVENOUS | Status: DC | PRN
  Administered 2016-09-01: 50 mg via INTRAVENOUS

## 2016-09-01 MED ORDER — LIDOCAINE HCL (CARDIAC) 20 MG/ML IV SOLN
INTRAVENOUS | Status: AC
  Filled 2016-09-01: qty 5

## 2016-09-01 MED ORDER — BUPIVACAINE HCL 0.5 % IJ SOLN
INTRAMUSCULAR | Status: DC | PRN
  Administered 2016-09-01: 2 mL

## 2016-09-01 MED ORDER — MIDAZOLAM HCL 2 MG/2ML IJ SOLN
INTRAMUSCULAR | Status: AC
  Filled 2016-09-01: qty 2

## 2016-09-01 SURGICAL SUPPLY — 7 items
CLOTH BEACON ORANGE TIMEOUT ST (SAFETY) ×3 IMPLANT
GLOVE BIO SURGEON STRL SZ 6.5 (GLOVE) ×2 IMPLANT
GLOVE BIO SURGEONS STRL SZ 6.5 (GLOVE) ×1
GLOVE BIOGEL PI IND STRL 7.0 (GLOVE) ×1 IMPLANT
GLOVE BIOGEL PI INDICATOR 7.0 (GLOVE) ×2
GOWN STRL REUS W/TWL LRG LVL3 (GOWN DISPOSABLE) ×6 IMPLANT
PACK VAGINAL MINOR WOMEN LF (CUSTOM PROCEDURE TRAY) ×3 IMPLANT

## 2016-09-01 NOTE — Anesthesia Procedure Notes (Signed)
Procedure Name: LMA Insertion Date/Time: 09/01/2016 12:29 PM Performed by: Hewitt Blade Pre-anesthesia Checklist: Patient identified, Emergency Drugs available, Suction available and Patient being monitored Patient Re-evaluated:Patient Re-evaluated prior to inductionOxygen Delivery Method: Circle system utilized Preoxygenation: Pre-oxygenation with 100% oxygen Intubation Type: IV induction LMA Size: 4.0 Number of attempts: 1 Placement Confirmation: positive ETCO2 and breath sounds checked- equal and bilateral Tube secured with: Tape Dental Injury: Teeth and Oropharynx as per pre-operative assessment

## 2016-09-01 NOTE — Anesthesia Postprocedure Evaluation (Signed)
Anesthesia Post Note  Patient: Ellen Ryan  Procedure(s) Performed: Procedure(s) (LRB): EXAM UNDER ANESTHESIA W/ BIOPSY (N/A)  Patient location during evaluation: PACU Anesthesia Type: General Level of consciousness: awake and alert Pain management: pain level controlled Vital Signs Assessment: post-procedure vital signs reviewed and stable Respiratory status: spontaneous breathing, nonlabored ventilation, respiratory function stable and patient connected to nasal cannula oxygen Cardiovascular status: blood pressure returned to baseline and stable Postop Assessment: no signs of nausea or vomiting Anesthetic complications: no        Last Vitals:  Vitals:   09/01/16 1330 09/01/16 1345  BP: 137/89 131/82  Pulse: 83 81  Resp: 19 20  Temp:      Last Pain:  Vitals:   09/01/16 1345  TempSrc:   PainSc: Asleep   Pain Goal: Patients Stated Pain Goal: 2 (09/01/16 1246)               Catalina Gravel

## 2016-09-01 NOTE — Op Note (Signed)
09/01/2016  1:08 PM  PATIENT:  Ellen Ryan  61 y.o. 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   Delay start of Pharmacological VTE agent (>24hrs) due to surgical blood loss or risk of bleeding: not applicable.  The risks, benefits and alternatives of surgery were explained, understood and accepted. Consent was signed. Questions were answered. In the OR, she was unable to open her legs, so MAC anesthesia was given. Her legs were then placed in stirrups. A time out procedure was done. I noted constant presence of clear urine all over the vulva. I attempted to put a pediatric speculum in the vagina but it would not pass. By doing a digital exam, it became apparent that her vaginal canal was entirely obliterated after the first 2 cm. 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 Please note that she has had 6 vaginal deliveries in the past). The inner labia were bloody  without even being touched as there was no epithelium covering this area. I biopsied an ulcerated edge of the vulva on the right. Silver nitrate yielded hemostasis.

## 2016-09-01 NOTE — Transfer of Care (Signed)
Immediate Anesthesia Transfer of Care Note  Patient: Darlyne Gatliff  Procedure(s) Performed: Procedure(s) with comments: EXAM UNDER ANESTHESIA W/ BIOPSY (N/A) - please have biopsy punch and currette available   Patient Location: PACU  Anesthesia Type:General  Level of Consciousness: awake, alert  and oriented  Airway & Oxygen Therapy: Patient Spontanous Breathing and Patient connected to nasal cannula oxygen  Post-op Assessment: Report given to RN, Post -op Vital signs reviewed and stable and Patient moving all extremities  Post vital signs: Reviewed and stable  Last Vitals:  Vitals:   09/01/16 1031  BP: 140/79  Pulse: 95  Temp: 36.8 C    Last Pain:  Vitals:   09/01/16 1031  TempSrc: Oral  PainSc: 3       Patients Stated Pain Goal: 2 (57/32/20 2542)  Complications: No apparent anesthesia complications

## 2016-09-01 NOTE — H&P (Signed)
Ellen Ryan is an 61 y.o. [place] P6 lady here for a vulvar biopsy. She has a long h/o vulvar pain and ulcerations. She had these biopsied in [place] and was told that they were benign. When I first saw her I saw definite incontinence and felt that the ulceration were due to the chronic wetness. I prescribed Ditropan and her symptoms greatly decreased at first but have returned. She is here today to have a vulvar biopsy and pap smear in the OR.   No LMP recorded. Patient is postmenopausal.    Past Medical History:  Diagnosis Date  . Arthritis    both knees  . Hyperlipidemia   . Hypertension     Past Surgical History:  Procedure Laterality Date  . COLPOSCOPY VULVA W/ BIOPSY      History reviewed. No pertinent family history.  Social History:  reports that she has never smoked. She has never used smokeless tobacco. She reports that she does not drink alcohol or use drugs.  Allergies: No Known Allergies  Prescriptions Prior to Admission  Medication Sig Dispense Refill Last Dose  . amLODipine (NORVASC) 10 MG tablet Take 1 tablet (10 mg total) by mouth daily. 90 tablet 3 09/01/2016 at 0700  . ibuprofen (ADVIL,MOTRIN) 200 MG tablet Take 400 mg by mouth every 6 (six) hours as needed for mild pain.    Past Week at Unknown time  . lidocaine (XYLOCAINE) 2 % jelly Apply 1 application topically as needed. 30 mL 2 Past Week at Unknown time  . Multiple Vitamin (MULTIVITAMIN WITH MINERALS) TABS tablet Take 1 tablet by mouth daily.   Past Week at Unknown time  . Omega-3 Fatty Acids (FISH OIL PO) Take 1 capsule by mouth daily.   Past Week at Unknown time  . oxybutynin (DITROPAN-XL) 10 MG 24 hr tablet Take 1 tablet (10 mg total) by mouth at bedtime. 30 tablet 12 08/31/2016 at Unknown time  . traMADol (ULTRAM) 50 MG tablet Take 1 tablet (50 mg total) by mouth every 6 (six) hours as needed. (Patient taking differently: Take 50 mg by mouth every 6 (six) hours as needed for moderate pain. ) 60 tablet 0  08/31/2016 at Unknown time  . acetaminophen-codeine (TYLENOL #3) 300-30 MG tablet Take 1 tablet by mouth every 4 (four) hours as needed. (Patient not taking: Reported on 08/23/2016) 90 tablet 0 Not Taking  . betamethasone dipropionate (DIPROLENE) 0.05 % cream Apply topically 2 (two) times daily. (Patient not taking: Reported on 06/30/2016) 45 g 0 Not Taking  . estradiol (ESTRACE) 0.1 MG/GM vaginal cream Apply 1 gram per vagina every night for 2 weeks, then apply three times a week (Patient not taking: Reported on 08/23/2016) 30 g 12 Not Taking  . meloxicam (MOBIC) 15 MG tablet Take 1 tablet (15 mg total) by mouth daily. (Patient not taking: Reported on 08/25/2016) 30 tablet 3 Not Taking at Unknown time  . oxyCODONE-acetaminophen (PERCOCET/ROXICET) 5-325 MG tablet Take 1-2 tablets by mouth every 6 (six) hours as needed. (Patient not taking: Reported on 08/23/2016) 90 tablet 0 Not Taking    ROS  Blood pressure 140/79, pulse 95, temperature 98.3 F (36.8 C), temperature source Oral. Physical Exam  Pleasant AA lady NAD She ambulates slowly due to her vulvar pain Heart- rrr Lungs- CTAB Abd- benign  No results found for this or any previous visit (from the past 24 hour(s)).  No results found.  Assessment/Plan: Vulvar ulcerations- for biopsy I will do a pap smear in the OR as well.  Emily Filbert 09/01/2016, 11:20 AM

## 2016-09-01 NOTE — Anesthesia Preprocedure Evaluation (Addendum)
Anesthesia Evaluation  Patient identified by MRN, date of birth, ID band Patient awake    Reviewed: Allergy & Precautions, NPO status , Patient's Chart, lab work & pertinent test results  Airway Mallampati: II  TM Distance: >3 FB Neck ROM: Full    Dental  (+) Teeth Intact, Dental Advisory Given   Pulmonary neg pulmonary ROS,    Pulmonary exam normal breath sounds clear to auscultation       Cardiovascular hypertension, Pt. on medications Normal cardiovascular exam Rhythm:Regular Rate:Normal     Neuro/Psych negative neurological ROS  negative psych ROS   GI/Hepatic negative GI ROS, Neg liver ROS,   Endo/Other  Obesity   Renal/GU negative Renal ROS     Musculoskeletal  (+) Arthritis , Osteoarthritis,    Abdominal   Peds  Hematology negative hematology ROS (+)   Anesthesia Other Findings Day of surgery medications reviewed with the patient.  Reproductive/Obstetrics                             Anesthesia Physical Anesthesia Plan  ASA: II  Anesthesia Plan: MAC   Post-op Pain Management:    Induction: Intravenous  Airway Management Planned: Nasal Cannula  Additional Equipment:   Intra-op Plan:   Post-operative Plan:   Informed Consent: I have reviewed the patients History and Physical, chart, labs and discussed the procedure including the risks, benefits and alternatives for the proposed anesthesia with the patient or authorized representative who has indicated his/her understanding and acceptance.   Dental advisory given  Plan Discussed with: CRNA  Anesthesia Plan Comments: (Discussed risks/benefits/alternatives to MAC sedation including need for ventilatory support, hypotension, need for conversion to general anesthesia.  All patient questions answered.  Patient/guardian wishes to proceed.)       Anesthesia Quick Evaluation

## 2016-09-01 NOTE — Discharge Instructions (Signed)
°  Post Anesthesia Home Care Instructions  Activity: Get plenty of rest for the remainder of the day. A responsible individual must stay with you for 24 hours following the procedure.  For the next 24 hours, DO NOT: -Drive a car -Paediatric nurse -Drink alcoholic beverages -Take any medication unless instructed by your physician -Make any legal decisions or sign important papers.  Meals: Start with liquid foods such as gelatin or soup. Progress to regular foods as tolerated. Avoid greasy, spicy, heavy foods. If nausea and/or vomiting occur, drink only clear liquids until the nausea and/or vomiting subsides. Call your physician if vomiting continues.  Special Instructions/Symptoms: Your throat may feel dry or sore from the anesthesia or the breathing tube placed in your throat during surgery. If this causes discomfort, gargle with warm salt water. The discomfort should disappear within 24 hours.  May take a shower on Saturday. No tub baths, hot tubs, swimming pools for 1 week. Wipe from the front to the back. Percocet Rx given for moderate to severe pain every six hours as needed.

## 2016-09-07 ENCOUNTER — Telehealth: Payer: Self-pay | Admitting: *Deleted

## 2016-09-07 NOTE — Telephone Encounter (Signed)
Received a message left on the nurse voicemail on 09/06/16 at 1638.  Melanie with [facility] calling with results from vulva biopsy.  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  Will send message to Dr. Hulan Fray for recommendations.

## 2016-09-08 ENCOUNTER — Other Ambulatory Visit: Payer: Self-pay | Admitting: General Practice

## 2016-09-08 ENCOUNTER — Telehealth: Payer: Self-pay | Admitting: General Practice

## 2016-09-08 DIAGNOSIS — C4499 Other specified malignant neoplasm of skin, unspecified: Secondary | ICD-10-CM

## 2016-09-08 DIAGNOSIS — C519 Malignant neoplasm of vulva, unspecified: Secondary | ICD-10-CM

## 2016-09-08 NOTE — Telephone Encounter (Signed)
Called patient's daughter and informed her of referral placed & that someone will be contacting them with an appt in the next couple days. Told her if they do not have an appt by the end of the day on Monday to give me a call that Tuesday. She verbalized understanding and asked about an ultrasound being scheduled. Told her that [name] did not advise me of that and to wait and see what the specialist wants to do. She verbalized understanding & had no questions

## 2016-09-13 ENCOUNTER — Telehealth: Payer: Self-pay | Admitting: *Deleted

## 2016-09-13 NOTE — Telephone Encounter (Signed)
Scheduled appt for 5/25 @ 230pm. Called patient's daughter and informed her of appt. She verbalized understanding and had no questions

## 2016-09-13 NOTE — Telephone Encounter (Signed)
Patient's daughter called on her behalf. Wants to speak with [name] and let her know that they have not received a call from gyn/onc yet. Please return call.

## 2016-09-15 ENCOUNTER — Ambulatory Visit: Payer: Self-pay | Attending: Gynecologic Oncology | Admitting: Gynecologic Oncology

## 2016-09-15 ENCOUNTER — Encounter: Payer: Self-pay | Admitting: Gynecologic Oncology

## 2016-09-15 VITALS — BP 164/87 | HR 104 | Temp 98.9°F | Resp 20 | Ht 58.66 in | Wt 174.6 lb

## 2016-09-15 DIAGNOSIS — C519 Malignant neoplasm of vulva, unspecified: Secondary | ICD-10-CM | POA: Insufficient documentation

## 2016-09-15 DIAGNOSIS — C4499 Other specified malignant neoplasm of skin, unspecified: Secondary | ICD-10-CM | POA: Insufficient documentation

## 2016-09-15 NOTE — Progress Notes (Signed)
Consult Note: Gyn-Onc  Consult was requested by Dr. Hulan Fray for the evaluation of Ellen Ryan 61 y.o. female  CC:  Chief Complaint  Patient presents with  . Pagets Disease of the Vulva    Assessment/Plan:  [name]  is a 61 y.o.  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  It is very difficult to assess the extent of the pagets due to her intolerance of an office exam. I can see that it at least involves the circumferential labia minora. It is unclear how closely it extends to the urethral meatus.   I recommend examination under anesthesia with complete bilateral radical vulvectomy, possible transposition flaps. If the disease is more extensive than what is appreciated on office exam, we will abort the procedure and plan for a combined procedure with plastic surgery for flap placement.  I do not think that she is a good candidate for non-surgical management such as Aldara as she is unable to tolerate manipulation of the tissues.  I discussed risks of surgery, particularly wound healing and separation and damage to adjacent structures.  HPI: Ellen Ryan is a 61 year old P6 who is seen in consultation at the request of [name] for extramammary pagets disease.   The patient is from [place] and is visiting at present. She reports 2 years of vulvar ulceration. She also has symptoms of trapping urine in the vulvar folds.  She requires opiods for her vulvar ulceration pain.  She was seen by [name] for this problem in April, 2018 and was taken to the OR for an examination under anesthesia on 09/01/16. Extensive ulceration was present. 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    Current Meds:  Outpatient Encounter Prescriptions as of 09/15/2016  Medication Sig  . acetaminophen-codeine (TYLENOL #3) 300-30 MG tablet Take 1 tablet by mouth every 4 (four) hours as needed.  Marland Kitchen amLODipine (NORVASC) 10 MG tablet Take 1 tablet (10 mg total) by mouth daily.  .  betamethasone dipropionate (DIPROLENE) 0.05 % cream Apply topically 2 (two) times daily.  Marland Kitchen ibuprofen (ADVIL,MOTRIN) 200 MG tablet Take 400 mg by mouth every 6 (six) hours as needed for mild pain.   Marland Kitchen lidocaine (XYLOCAINE) 2 % jelly Apply 1 application topically as needed.  . meloxicam (MOBIC) 15 MG tablet Take 1 tablet (15 mg total) by mouth daily.  . Multiple Vitamin (MULTIVITAMIN WITH MINERALS) TABS tablet Take 1 tablet by mouth daily.  . Omega-3 Fatty Acids (FISH OIL PO) Take 1 capsule by mouth daily.  Marland Kitchen oxybutynin (DITROPAN-XL) 10 MG 24 hr tablet Take 1 tablet (10 mg total) by mouth at bedtime.  Marland Kitchen oxyCODONE-acetaminophen (PERCOCET/ROXICET) 5-325 MG tablet Take 1-2 tablets by mouth every 6 (six) hours as needed.   No facility-administered encounter medications on file as of 09/15/2016.     Allergy: No Known Allergies  Social Hx:   Social History   Social History  . Marital status: Widowed    Spouse name: N/A  . Number of children: N/A  . Years of education: N/A   Occupational History  . Not on file.   Social History Main Topics  . Smoking status: Never Smoker  . Smokeless tobacco: Never Used  . Alcohol use No  . Drug use: No  . Sexual activity: Not on file   Other Topics Concern  . Not on file   Social History Narrative  . No narrative on file    Past Surgical Hx:  Past Surgical  History:  Procedure Laterality Date  . COLPOSCOPY VULVA W/ BIOPSY      Past Medical Hx:  Past Medical History:  Diagnosis Date  . Arthritis    both knees  . Hyperlipidemia   . Hypertension     Past Gynecological History:  SVD x 6 No LMP recorded. Patient is postmenopausal. 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  Family Hx: History reviewed. No pertinent family history.  Review of Systems:  Constitutional  Feels pain  ENT Normal appearing ears and nares bilaterally Skin/Breast  No rash, sores, jaundice, itching, dryness Cardiovascular  No chest pain, shortness of breath, or  edema  Pulmonary  No cough or wheeze.  Gastro Intestinal  No nausea, vomitting, or diarrhoea. No bright red blood per rectum, no abdominal pain, change in bowel movement, or constipation.  Genito Urinary  No frequency, urgency, dysuria, +vulvar pain and leakage of urine Musculo Skeletal  No myalgia, arthralgia, joint swelling or pain  Neurologic  No weakness, numbness, change in gait,  Psychology  No depression, anxiety, insomnia.   Vitals:  Blood pressure (!) 164/87, pulse (!) 104, temperature 98.9 F (37.2 C), temperature source Oral, resp. rate 20, height 4' 10.66" (1.49 m), weight 174 lb 9.6 oz (79.2 kg).  Physical Exam: WD in NAD Neck  Supple NROM, without any enlargements.  Lymph Node Survey No cervical supraclavicular or inguinal adenopathy Cardiovascular  Pulse normal rate, regularity and rhythm. S1 and S2 normal.  Lungs  Clear to auscultation bilateraly, without wheezes/crackles/rhonchi. Good air movement.  Skin  No rash/lesions/breakdown  Psychiatry  Alert and oriented to person, place, and time  Abdomen  Normoactive bowel sounds, abdomen soft, non-tender and obese without evidence of hernia.  Back No CVA tenderness Genito Urinary  Extremely limited due to patient's intolerance to the exam. Velvety pagetoid lesions/ulcers covering bilateral labia minora circumferentially. Clitoral hood covered. Unable to visualize the urethra. Unable to pass finger through vulva due to discomfort.  Rectal  deferred Extremities  No bilateral cyanosis, clubbing or edema.   Donaciano Eva, MD  09/15/2016, 3:49 PM

## 2016-09-15 NOTE — Patient Instructions (Signed)
Preparing for your Surgery  Plan for surgery on May 31 with Dr. Everitt Amber at [facility] will be scheduled for a complete radical vulvectomy.    Pre-operative Testing -You will receive a phone call from presurgical testing at [facility] to arrange for a pre-operative testing appointment before your surgery.  This appointment normally occurs one to two weeks before your scheduled surgery.   -Bring your insurance card, copy of an advanced directive if applicable, medication list  -At that visit, you will be asked to sign a consent for a possible blood transfusion in case a transfusion becomes necessary during surgery.  The need for a blood transfusion is rare but having consent is a necessary part of your care.     -You should not be taking blood thinners or aspirin at least ten days prior to surgery unless instructed by your surgeon.  Day Before Surgery at [facility] will be asked to take in a light diet the day before surgery.  Avoid carbonated beverages.  You will be advised to have nothing to eat or drink after midnight the evening before.     Eat a light diet the day before surgery.  Examples including soups, broths, toast, yogurt, mashed potatoes.  Things to avoid include carbonated beverages (fizzy beverages), raw fruits and raw vegetables, or beans.    If your bowels are filled with gas, your surgeon will have difficulty visualizing your pelvic organs which increases your surgical risks.  Your role in recovery Your role is to become active as soon as directed by your doctor, while still giving yourself time to heal.  Rest when you feel tired. You will be asked to do the following in order to speed your recovery:  - Cough and breathe deeply. This helps toclear and expand your lungs and can prevent pneumonia. You may be given a spirometer to practice deep breathing. A staff member will show you how to use the spirometer. - Do mild physical activity.  Walking or moving your legs help your circulation and body functions return to normal. A staff member will help you when you try to walk and will provide you with simple exercises. Do not try to get up or walk alone the first time. - Actively manage your pain. Managing your pain lets you move in comfort. We will ask you to rate your pain on a scale of zero to 10. It is your responsibility to tell your doctor or nurse where and how much you hurt so your pain can be treated.  Special Considerations -If you are diabetic, you may be placed on insulin after surgery to have closer control over your blood sugars to promote healing and recovery.  This does not mean that you will be discharged on insulin.  If applicable, your oral antidiabetics will be resumed when you are tolerating a solid diet.  -Your final pathology results from surgery should be available by the Friday after surgery and the results will be relayed to you when available.   Blood Transfusion Information WHAT IS A BLOOD TRANSFUSION? A transfusion is the replacement of blood or some of its parts. Blood is made up of multiple cells which provide different functions.  Red blood cells carry oxygen and are used for blood loss replacement.  White blood cells fight against infection.  Platelets control bleeding.  Plasma helps clot blood.  Other blood products are available for specialized needs, such as hemophilia or other clotting disorders. BEFORE THE  TRANSFUSION  Who gives blood for transfusions?   You may be able to donate blood to be used at a later date on yourself (autologous donation).  Relatives can be asked to donate blood. This is generally not any safer than if you have received blood from a stranger. The same precautions are taken to ensure safety when a relative's blood is donated.  Healthy volunteers who are fully evaluated to make sure their blood is safe. This is blood bank blood. Transfusion therapy is the safest it  has ever been in the practice of medicine. Before blood is taken from a donor, a complete history is taken to make sure that person has no history of diseases nor engages in risky social behavior (examples are intravenous drug use or sexual activity with multiple partners). The donor's travel history is screened to minimize risk of transmitting infections, such as malaria. The donated blood is tested for signs of infectious diseases, such as HIV and hepatitis. The blood is then tested to be sure it is compatible with you in order to minimize the chance of a transfusion reaction. If you or a relative donates blood, this is often done in anticipation of surgery and is not appropriate for emergency situations. It takes many days to process the donated blood. RISKS AND COMPLICATIONS Although transfusion therapy is very safe and saves many lives, the main dangers of transfusion include:   Getting an infectious disease.  Developing a transfusion reaction. This is an allergic reaction to something in the blood you were given. Every precaution is taken to prevent this. The decision to have a blood transfusion has been considered carefully by your caregiver before blood is given. Blood is not given unless the benefits outweigh the risks.

## 2016-09-19 NOTE — Progress Notes (Signed)
EKG 08-30-16 epic CBC, BMP 08-30-16 epic Jana Hakim MD 05-17-16 epic

## 2016-09-19 NOTE — Patient Instructions (Signed)
Ellen Ryan  09/19/2016   Your procedure is scheduled on: 09-21-16  Report to [facility] Main  Entrance Take [place]  elevators to 3rd floor to  [place] at 11:15AM.   Call this number if you have problems the morning of surgery [phone]   Remember: ONLY 1 PERSON MAY GO WITH YOU TO SHORT STAY TO GET  READY MORNING OF [place].  Do not eat food After Midnight. You may have clear liquids from midnight until 715am day of surgery. Nothing by mouth after 715am!!     Take these medicines the morning of surgery with A SIP OF WATER: amlodipine(Norvasc), percocet as needed, tylenol as needed                                You may not have any metal on your body including hair pins and              piercings  Do not wear jewelry, make-up, lotions, powders or perfumes, deodorant             Do not wear nail polish.  Do not shave  48 hours prior to surgery.        Do not bring valuables to the hospital. [place].  Contacts, dentures or bridgework may not be worn into surgery.  Leave suitcase in the car. After surgery it may be brought to your room.                Please read over the following fact sheets you were given: _____________________________________________________________________   CLEAR LIQUID DIET   Foods Allowed                                                                     Foods Excluded  Coffee and tea, regular and decaf                             liquids that you cannot  Plain Jell-O in any flavor                                             see through such as: Fruit ices (not with fruit pulp)                                     milk, soups, orange juice  Iced Popsicles                                    All solid food Carbonated beverages, regular and diet  Cranberry, grape and apple juices Sports drinks like Gatorade Lightly seasoned  clear broth or consume(fat free) Sugar, honey syrup  Sample Menu Breakfast                                Lunch                                     Supper Cranberry juice                    Beef broth                            Chicken broth Jell-O                                     Grape juice                           Apple juice Coffee or tea                        Jell-O                                      Popsicle                                                Coffee or tea                        Coffee or tea  _____________________________________________________________________  [facility] - Preparing for Surgery Before surgery, you can play an important role.  Because skin is not sterile, your skin needs to be as free of germs as possible.  You can reduce the number of germs on your skin by washing with CHG (chlorahexidine gluconate) soap before surgery.  CHG is an antiseptic cleaner which kills germs and bonds with the skin to continue killing germs even after washing. Please DO NOT use if you have an allergy to CHG or antibacterial soaps.  If your skin becomes reddened/irritated stop using the CHG and inform your nurse when you arrive at Short Stay. Do not shave (including legs and underarms) for at least 48 hours prior to the first CHG shower.  You may shave your face/neck. Please follow these instructions carefully:  1.  Shower with CHG Soap the night before surgery and the  morning of Surgery.  2.  If you choose to wash your hair, wash your hair first as usual with your  normal  shampoo.  3.  After you shampoo, rinse your hair and body thoroughly to remove the  shampoo.                           4.  Use CHG as you would any other liquid soap.  You can apply chg directly  to the skin and wash  Gently with a scrungie or clean washcloth.  5.  Apply the CHG Soap to your body ONLY FROM THE NECK DOWN.   Do not use on face/ open                           Wound or  open sores. Avoid contact with eyes, ears mouth and genitals (private parts).                       Wash face,  Genitals (private parts) with your normal soap.             6.  Wash thoroughly, paying special attention to the area where your surgery  will be performed.  7.  Thoroughly rinse your body with warm water from the neck down.  8.  DO NOT shower/wash with your normal soap after using and rinsing off  the CHG Soap.                9.  Pat yourself dry with a clean towel.            10.  Wear clean pajamas.            11.  Place clean sheets on your bed the night of your first shower and do not  sleep with pets. Day of Surgery : Do not apply any lotions/deodorants the morning of surgery.  Please wear clean clothes to the hospital/surgery center.  FAILURE TO FOLLOW THESE INSTRUCTIONS MAY RESULT IN THE CANCELLATION OF YOUR SURGERY PATIENT SIGNATURE_________________________________  NURSE SIGNATURE__________________________________  ________________________________________________________________________   Adam Phenix  An incentive spirometer is a tool that can help keep your lungs clear and active. This tool measures how well you are filling your lungs with each breath. Taking long deep breaths may help reverse or decrease the chance of developing breathing (pulmonary) problems (especially infection) following:  A long period of time when you are unable to move or be active. BEFORE THE PROCEDURE   If the spirometer includes an indicator to show your best effort, your nurse or respiratory therapist will set it to a desired goal.  If possible, sit up straight or lean slightly forward. Try not to slouch.  Hold the incentive spirometer in an upright position. INSTRUCTIONS FOR USE  1. Sit on the edge of your bed if possible, or sit up as far as you can in bed or on a chair. 2. Hold the incentive spirometer in an upright position. 3. Breathe out normally. 4. Place the  mouthpiece in your mouth and seal your lips tightly around it. 5. Breathe in slowly and as deeply as possible, raising the piston or the ball toward the top of the column. 6. Hold your breath for 3-5 seconds or for as long as possible. Allow the piston or ball to fall to the bottom of the column. 7. Remove the mouthpiece from your mouth and breathe out normally. 8. Rest for a few seconds and repeat Steps 1 through 7 at least 10 times every 1-2 hours when you are awake. Take your time and take a few normal breaths between deep breaths. 9. The spirometer may include an indicator to show your best effort. Use the indicator as a goal to work toward during each repetition. 10. After each set of 10 deep breaths, practice coughing to be sure your lungs are clear. If you have an incision (the cut made at the time of  surgery), support your incision when coughing by placing a pillow or rolled up towels firmly against it. Once you are able to get out of bed, walk around indoors and cough well. You may stop using the incentive spirometer when instructed by your caregiver.  RISKS AND COMPLICATIONS  Take your time so you do not get dizzy or light-headed.  If you are in pain, you may need to take or ask for pain medication before doing incentive spirometry. It is harder to take a deep breath if you are having pain. AFTER USE  Rest and breathe slowly and easily.  It can be helpful to keep track of a log of your progress. Your caregiver can provide you with a simple table to help with this. If you are using the spirometer at home, follow these instructions: [place] IF:   You are having difficultly using the spirometer.  You have trouble using the spirometer as often as instructed.  Your pain medication is not giving enough relief while using the spirometer.  You develop fever of 100.5 F (38.1 C) or higher. SEEK IMMEDIATE MEDICAL CARE IF:   You cough up bloody sputum that had not been present  before.  You develop fever of 102 F (38.9 C) or greater.  You develop worsening pain at or near the incision site. MAKE SURE YOU:   Understand these instructions.  Will watch your condition.  Will get help right away if you are not doing well or get worse. Document Released: 08/21/2006 Document Revised: 07/03/2011 Document Reviewed: 10/22/2006 [facility] Patient Information 2014 [place], [place].   ________________________________________________________________________

## 2016-09-20 ENCOUNTER — Encounter (HOSPITAL_COMMUNITY)
Admission: RE | Admit: 2016-09-20 | Discharge: 2016-09-20 | Disposition: A | Discharge status: Home / Self Care | Payer: Self-pay | Source: Ambulatory Visit | Attending: Gynecologic Oncology | Admitting: Gynecologic Oncology

## 2016-09-20 ENCOUNTER — Encounter (HOSPITAL_COMMUNITY): Payer: Self-pay

## 2016-09-20 DIAGNOSIS — C4499 Other specified malignant neoplasm of skin, unspecified: Secondary | ICD-10-CM | POA: Insufficient documentation

## 2016-09-20 DIAGNOSIS — Z01818 Encounter for other preprocedural examination: Secondary | ICD-10-CM | POA: Insufficient documentation

## 2016-09-20 LAB — CBC WITH DIFFERENTIAL/PLATELET
BASOS ABS: 0 10*3/uL (ref 0.0–0.1)
BASOS PCT: 0 %
EOS ABS: 0.1 10*3/uL (ref 0.0–0.7)
Eosinophils Relative: 1 %
HCT: 38.7 % (ref 36.0–46.0)
Hemoglobin: 12.2 g/dL (ref 12.0–15.0)
LYMPHS PCT: 43 %
Lymphs Abs: 4.3 10*3/uL — ABNORMAL HIGH (ref 0.7–4.0)
MCH: 22.9 pg — AB (ref 26.0–34.0)
MCHC: 31.5 g/dL (ref 30.0–36.0)
MCV: 72.6 fL — ABNORMAL LOW (ref 78.0–100.0)
MONO ABS: 0.5 10*3/uL (ref 0.1–1.0)
Monocytes Relative: 5 %
NEUTROS PCT: 51 %
Neutro Abs: 5.1 10*3/uL (ref 1.7–7.7)
PLATELETS: 314 10*3/uL (ref 150–400)
RBC: 5.33 MIL/uL — ABNORMAL HIGH (ref 3.87–5.11)
RDW: 14.8 % (ref 11.5–15.5)
WBC: 10 10*3/uL (ref 4.0–10.5)

## 2016-09-20 LAB — COMPREHENSIVE METABOLIC PANEL
ALBUMIN: 4.1 g/dL (ref 3.5–5.0)
ALT: 25 U/L (ref 14–54)
ANION GAP: 10 (ref 5–15)
AST: 30 U/L (ref 15–41)
Alkaline Phosphatase: 74 U/L (ref 38–126)
BUN: 11 mg/dL (ref 6–20)
CHLORIDE: 104 mmol/L (ref 101–111)
CO2: 28 mmol/L (ref 22–32)
CREATININE: 0.62 mg/dL (ref 0.44–1.00)
Calcium: 10.2 mg/dL (ref 8.9–10.3)
GFR calc non Af Amer: >60 mL/min (ref >60)
Glucose, Bld: 124 mg/dL — ABNORMAL HIGH (ref 65–99)
Potassium: 4 mmol/L (ref 3.5–5.1)
SODIUM: 142 mmol/L (ref 135–145)
Total Bilirubin: 0.5 mg/dL (ref 0.3–1.2)
Total Protein: 7.9 g/dL (ref 6.5–8.1)

## 2016-09-20 LAB — URINALYSIS, ROUTINE W REFLEX MICROSCOPIC
BILIRUBIN URINE: NEGATIVE
Glucose, UA: 50 mg/dL — AB
Ketones, ur: NEGATIVE mg/dL
NITRITE: NEGATIVE
PROTEIN: NEGATIVE mg/dL
SQUAMOUS EPITHELIAL / LPF: NONE SEEN
Specific Gravity, Urine: 1.015 (ref 1.005–1.030)
pH: 5 (ref 5.0–8.0)

## 2016-09-20 MED FILL — ?AMLODIPINE BESYLATE 5 MG T: 5 | 30 days supply | Qty: 30 | Fill #1

## 2016-09-20 NOTE — Progress Notes (Signed)
UA results routed via epic to Denman George MD

## 2016-09-21 ENCOUNTER — Encounter (HOSPITAL_COMMUNITY): Payer: Self-pay | Admitting: Anesthesiology

## 2016-09-21 ENCOUNTER — Encounter (HOSPITAL_COMMUNITY)
Admission: RE | Disposition: A | Discharge status: Home / Self Care | Payer: Self-pay | Source: Ambulatory Visit | Attending: Gynecologic Oncology

## 2016-09-21 ENCOUNTER — Ambulatory Visit (HOSPITAL_COMMUNITY)
Admission: RE | Admit: 2016-09-21 | Discharge: 2016-09-22 | Disposition: A | Discharge status: Home / Self Care | Payer: Self-pay | Source: Ambulatory Visit | Attending: Gynecologic Oncology | Admitting: Gynecologic Oncology

## 2016-09-21 ENCOUNTER — Ambulatory Visit (HOSPITAL_COMMUNITY): Payer: Self-pay | Admitting: Anesthesiology

## 2016-09-21 ENCOUNTER — Telehealth: Payer: Self-pay | Admitting: *Deleted

## 2016-09-21 DIAGNOSIS — I10 Essential (primary) hypertension: Secondary | ICD-10-CM | POA: Insufficient documentation

## 2016-09-21 DIAGNOSIS — C4499 Other specified malignant neoplasm of skin, unspecified: Secondary | ICD-10-CM

## 2016-09-21 DIAGNOSIS — Z79899 Other long term (current) drug therapy: Secondary | ICD-10-CM | POA: Insufficient documentation

## 2016-09-21 DIAGNOSIS — C519 Malignant neoplasm of vulva, unspecified: Secondary | ICD-10-CM | POA: Diagnosis present

## 2016-09-21 DIAGNOSIS — M170 Bilateral primary osteoarthritis of knee: Secondary | ICD-10-CM | POA: Insufficient documentation

## 2016-09-21 DIAGNOSIS — D071 Carcinoma in situ of vulva: Secondary | ICD-10-CM | POA: Insufficient documentation

## 2016-09-21 DIAGNOSIS — E785 Hyperlipidemia, unspecified: Secondary | ICD-10-CM | POA: Insufficient documentation

## 2016-09-21 HISTORY — PX: RADICAL VULVECTOMY: SHX6584

## 2016-09-21 SURGERY — VULVECTOMY, RADICAL
Anesthesia: General

## 2016-09-21 MED ORDER — AMLODIPINE BESYLATE 10 MG PO TABS
10.0000 mg | ORAL_TABLET | Freq: Every day | ORAL | Status: DC
  Administered 2016-09-22: 10 mg via ORAL
  Filled 2016-09-21 (×2): qty 1

## 2016-09-21 MED ORDER — IBUPROFEN 400 MG PO TABS: 800.0000 mg | ORAL_TABLET | Freq: Three times a day (TID) | ORAL | Status: DC | PRN

## 2016-09-21 MED ORDER — CEFAZOLIN SODIUM-DEXTROSE 2-4 GM/100ML-% IV SOLN
2.0000 g | INTRAVENOUS | Status: AC
  Administered 2016-09-21: 2 g via INTRAVENOUS
  Filled 2016-09-21: qty 100

## 2016-09-21 MED ORDER — BACITRACIN-NEOMYCIN-POLYMYXIN 400-5-5000 EX OINT
TOPICAL_OINTMENT | CUTANEOUS | Status: AC
  Filled 2016-09-21: qty 1

## 2016-09-21 MED ORDER — HYDROMORPHONE HCL 2 MG/ML IJ SOLN
INTRAMUSCULAR | Status: AC
  Filled 2016-09-21: qty 1

## 2016-09-21 MED ORDER — 0.9 % SODIUM CHLORIDE (POUR BTL) OPTIME
TOPICAL | Status: DC | PRN
  Administered 2016-09-21: 1000 mL

## 2016-09-21 MED ORDER — LIDOCAINE HCL (CARDIAC) 20 MG/ML IV SOLN
INTRAVENOUS | Status: DC | PRN
  Administered 2016-09-21: 100 mg via INTRATRACHEAL

## 2016-09-21 MED ORDER — HYDROMORPHONE HCL 1 MG/ML IJ SOLN
0.2500 mg | INTRAMUSCULAR | Status: DC | PRN
  Administered 2016-09-21 (×2): 0.5 mg via INTRAVENOUS

## 2016-09-21 MED ORDER — SENNOSIDES-DOCUSATE SODIUM 8.6-50 MG PO TABS
2.0000 | ORAL_TABLET | Freq: Every day | ORAL | Status: DC
  Administered 2016-09-21: 2 via ORAL
  Filled 2016-09-21: qty 2

## 2016-09-21 MED ORDER — PHENYLEPHRINE HCL 10 MG/ML IJ SOLN
INTRAMUSCULAR | Status: DC | PRN
  Administered 2016-09-21 (×2): 40 ug via INTRAVENOUS
  Administered 2016-09-21 (×4): 80 ug via INTRAVENOUS

## 2016-09-21 MED ORDER — ONDANSETRON HCL 4 MG/2ML IJ SOLN
INTRAMUSCULAR | Status: DC | PRN
Start: 2016-09-21 — End: 2016-09-21
  Administered 2016-09-21: 4 mg via INTRAVENOUS

## 2016-09-21 MED ORDER — FENTANYL CITRATE (PF) 100 MCG/2ML IJ SOLN
INTRAMUSCULAR | Status: AC
  Filled 2016-09-21: qty 2

## 2016-09-21 MED ORDER — KETAMINE HCL 10 MG/ML IJ SOLN
INTRAMUSCULAR | Status: DC | PRN
  Administered 2016-09-21 (×5): 10 mg via INTRAVENOUS

## 2016-09-21 MED ORDER — MIDAZOLAM HCL 2 MG/2ML IJ SOLN
INTRAMUSCULAR | Status: DC | PRN
  Administered 2016-09-21: 2 mg via INTRAVENOUS

## 2016-09-21 MED ORDER — PROPOFOL 10 MG/ML IV BOLUS
INTRAVENOUS | Status: DC | PRN
  Administered 2016-09-21: 200 mg via INTRAVENOUS

## 2016-09-21 MED ORDER — OXYCODONE-ACETAMINOPHEN 5-325 MG PO TABS: 1.0000 | ORAL_TABLET | ORAL | Status: DC | PRN

## 2016-09-21 MED ORDER — ONDANSETRON HCL 4 MG/2ML IJ SOLN
INTRAMUSCULAR | Status: AC
  Filled 2016-09-21: qty 2

## 2016-09-21 MED ORDER — DEXAMETHASONE SODIUM PHOSPHATE 10 MG/ML IJ SOLN
INTRAMUSCULAR | Status: DC | PRN
  Administered 2016-09-21: 10 mg via INTRAVENOUS

## 2016-09-21 MED ORDER — LIDOCAINE HCL 1 % IJ SOLN
INTRAMUSCULAR | Status: AC
  Filled 2016-09-21: qty 20

## 2016-09-21 MED ORDER — HYDROMORPHONE HCL 1 MG/ML IJ SOLN: 0.2000 mg | INTRAMUSCULAR | Status: DC | PRN

## 2016-09-21 MED ORDER — ONDANSETRON HCL 4 MG/2ML IJ SOLN: 4.0000 mg | Freq: Four times a day (QID) | INTRAMUSCULAR | Status: DC | PRN

## 2016-09-21 MED ORDER — FENTANYL CITRATE (PF) 250 MCG/5ML IJ SOLN
INTRAMUSCULAR | Status: DC | PRN
  Administered 2016-09-21 (×5): 25 ug via INTRAVENOUS
  Administered 2016-09-21: 50 ug via INTRAVENOUS
  Administered 2016-09-21: 25 ug via INTRAVENOUS

## 2016-09-21 MED ORDER — PROPOFOL 10 MG/ML IV BOLUS
INTRAVENOUS | Status: AC
  Filled 2016-09-21: qty 20

## 2016-09-21 MED ORDER — KCL IN DEXTROSE-NACL 20-5-0.45 MEQ/L-%-% IV SOLN
INTRAVENOUS | Status: DC
  Administered 2016-09-21: 17:00:00 via INTRAVENOUS
  Filled 2016-09-21 (×2): qty 1000

## 2016-09-21 MED ORDER — LACTATED RINGERS IV SOLN
INTRAVENOUS | Status: DC
  Administered 2016-09-21 (×2): via INTRAVENOUS

## 2016-09-21 MED ORDER — LIDOCAINE HCL 1 % IJ SOLN
INTRAMUSCULAR | Status: DC | PRN
  Administered 2016-09-21: 30 mL

## 2016-09-21 MED ORDER — PROMETHAZINE HCL 25 MG/ML IJ SOLN: 6.2500 mg | INTRAMUSCULAR | Status: DC | PRN

## 2016-09-21 MED ORDER — PHENYLEPHRINE 40 MCG/ML (10ML) SYRINGE FOR IV PUSH (FOR BLOOD PRESSURE SUPPORT)
PREFILLED_SYRINGE | INTRAVENOUS | Status: AC
  Filled 2016-09-21: qty 20

## 2016-09-21 MED ORDER — DEXAMETHASONE SODIUM PHOSPHATE 10 MG/ML IJ SOLN
INTRAMUSCULAR | Status: AC
  Filled 2016-09-21: qty 1

## 2016-09-21 MED ORDER — BUPIVACAINE LIPOSOME 1.3 % IJ SUSP
20.0000 mL | Freq: Once | INTRAMUSCULAR | Status: AC
  Administered 2016-09-21: 20 mL
  Filled 2016-09-21: qty 20

## 2016-09-21 MED ORDER — ONDANSETRON HCL 4 MG PO TABS: 4.0000 mg | ORAL_TABLET | Freq: Four times a day (QID) | ORAL | Status: DC | PRN

## 2016-09-21 MED ORDER — MIDAZOLAM HCL 2 MG/2ML IJ SOLN
INTRAMUSCULAR | Status: AC
  Filled 2016-09-21: qty 2

## 2016-09-21 MED ORDER — BACITRACIN-NEOMYCIN-POLYMYXIN 400-5-5000 EX OINT
TOPICAL_OINTMENT | CUTANEOUS | Status: DC | PRN
  Administered 2016-09-21: 1 via TOPICAL

## 2016-09-21 SURGICAL SUPPLY — 43 items
BLADE 15 SAFETY STRL DISP (BLADE) ×2 IMPLANT
BLADE SURG 15 STRL LF DISP TIS (BLADE) ×2 IMPLANT
BLADE SURG 15 STRL SS (BLADE) ×2
COVER SURGICAL LIGHT HANDLE (MISCELLANEOUS) ×2 IMPLANT
DRAPE SURG IRRIG POUCH 19X23 (DRAPES) ×2 IMPLANT
DRSG TELFA 3X8 NADH (GAUZE/BANDAGES/DRESSINGS) ×2 IMPLANT
ELECT PENCIL ROCKER SW 15FT (MISCELLANEOUS) ×2 IMPLANT
GAUZE SPONGE 4X4 12PLY STRL (GAUZE/BANDAGES/DRESSINGS) ×2 IMPLANT
GAUZE SPONGE 4X4 16PLY XRAY LF (GAUZE/BANDAGES/DRESSINGS) ×6 IMPLANT
GLOVE BIO SURGEON STRL SZ 6 (GLOVE) ×4 IMPLANT
GOWN STRL REUS W/ TWL LRG LVL3 (GOWN DISPOSABLE) ×2 IMPLANT
GOWN STRL REUS W/TWL LRG LVL3 (GOWN DISPOSABLE) ×2
KIT BASIN OR (CUSTOM PROCEDURE TRAY) ×2 IMPLANT
NEEDLE HYPO 22GX1.5 SAFETY (NEEDLE) ×4 IMPLANT
NEEDLE SPNL 22GX7 QUINCKE BK (NEEDLE) IMPLANT
NS IRRIG 1000ML POUR BTL (IV SOLUTION) ×2 IMPLANT
PACK LITHOTOMY IV (CUSTOM PROCEDURE TRAY) ×2 IMPLANT
PACK PERINEAL COLD (PAD) ×6 IMPLANT
SCOPETTES 8  STERILE (MISCELLANEOUS)
SCOPETTES 8 STERILE (MISCELLANEOUS) IMPLANT
SHEARS HARMONIC 9CM CVD (BLADE) ×2 IMPLANT
SPONGE LAP 18X18 X RAY DECT (DISPOSABLE) ×4 IMPLANT
SUT PROLENE 2 0 CT 1 (SUTURE) ×6 IMPLANT
SUT PROLENE 2 0 SH DA (SUTURE) ×2 IMPLANT
SUT VIC AB 0 CT1 27 (SUTURE)
SUT VIC AB 0 CT1 27XBRD ANTBC (SUTURE) IMPLANT
SUT VIC AB 2-0 SH 27 (SUTURE) ×12
SUT VIC AB 2-0 SH 27X BRD (SUTURE) ×12 IMPLANT
SUT VIC AB 3-0 SH 27 (SUTURE) ×11
SUT VIC AB 3-0 SH 27XBRD (SUTURE) ×11 IMPLANT
SUT VIC AB 4-0 P2 18 (SUTURE) IMPLANT
SUT VIC AB 4-0 SH 27 (SUTURE) ×15
SUT VIC AB 4-0 SH 27XBRD (SUTURE) ×15 IMPLANT
SYR 20CC LL (SYRINGE) ×2 IMPLANT
SYR 30ML LL (SYRINGE) ×2 IMPLANT
SYR BULB IRRIGATION 50ML (SYRINGE) ×2 IMPLANT
SYR CONTROL 10ML LL (SYRINGE) ×2 IMPLANT
TOWEL OR 17X26 10 PK STRL BLUE (TOWEL DISPOSABLE) ×2 IMPLANT
TOWEL OR NON WOVEN STRL DISP B (DISPOSABLE) ×2 IMPLANT
TRAY FOLEY W/METER SILVER 16FR (SET/KITS/TRAYS/PACK) ×4 IMPLANT
UNDERPAD 30X30 (UNDERPADS AND DIAPERS) ×2 IMPLANT
WATER STERILE IRR 1500ML POUR (IV SOLUTION) ×2 IMPLANT
YANKAUER SUCT BULB TIP NO VENT (SUCTIONS) ×2 IMPLANT

## 2016-09-21 NOTE — Anesthesia Preprocedure Evaluation (Signed)
Anesthesia Evaluation  Patient identified by MRN, date of birth, ID band Patient awake    Reviewed: Allergy & Precautions, NPO status , Patient's Chart, lab work & pertinent test results  Airway Mallampati: II  TM Distance: >3 FB Neck ROM: Full    Dental no notable dental hx.    Pulmonary neg pulmonary ROS,    Pulmonary exam normal breath sounds clear to auscultation       Cardiovascular hypertension, Normal cardiovascular exam Rhythm:Regular Rate:Normal     Neuro/Psych negative neurological ROS  negative psych ROS   GI/Hepatic negative GI ROS, Neg liver ROS,   Endo/Other  negative endocrine ROS  Renal/GU negative Renal ROS  negative genitourinary   Musculoskeletal negative musculoskeletal ROS (+)   Abdominal   Peds negative pediatric ROS (+)  Hematology negative hematology ROS (+)   Anesthesia Other Findings   Reproductive/Obstetrics negative OB ROS                             Anesthesia Physical Anesthesia Plan  ASA: II  Anesthesia Plan: General   Post-op Pain Management:    Induction: Intravenous  Airway Management Planned: Oral ETT  Additional Equipment:   Intra-op Plan:   Post-operative Plan: Extubation in OR  Informed Consent: I have reviewed the patients History and Physical, chart, labs and discussed the procedure including the risks, benefits and alternatives for the proposed anesthesia with the patient or authorized representative who has indicated his/her understanding and acceptance.   Dental advisory given  Plan Discussed with: CRNA and Surgeon  Anesthesia Plan Comments:         Anesthesia Quick Evaluation  

## 2016-09-21 NOTE — Anesthesia Procedure Notes (Signed)
Procedure Name: LMA Insertion Date/Time: 09/21/2016 12:56 PM Performed by: Jack Mineau, Virgel Gess Pre-anesthesia Checklist: Patient identified, Emergency Drugs available, Suction available, Patient being monitored and Timeout performed Patient Re-evaluated:Patient Re-evaluated prior to inductionOxygen Delivery Method: Circle system utilized Preoxygenation: Pre-oxygenation with 100% oxygen Intubation Type: IV induction Ventilation: Mask ventilation without difficulty LMA: LMA inserted LMA Size: 4.0 Tube size: 4.0 mm Number of attempts: 1 Placement Confirmation: positive ETCO2 and breath sounds checked- equal and bilateral Tube secured with: Tape Dental Injury: Teeth and Oropharynx as per pre-operative assessment

## 2016-09-21 NOTE — Anesthesia Postprocedure Evaluation (Signed)
Anesthesia Post Note  Patient: Ellen Ryan  Procedure(s) Performed: Procedure(s) (LRB): RADICAL VULVECTOMY COMPLETE (N/A)     Patient location during evaluation: PACU Anesthesia Type: General Level of consciousness: awake and alert Pain management: pain level controlled Vital Signs Assessment: post-procedure vital signs reviewed and stable Respiratory status: spontaneous breathing, nonlabored ventilation, respiratory function stable and patient connected to nasal cannula oxygen Cardiovascular status: blood pressure returned to baseline and stable Postop Assessment: no signs of nausea or vomiting Anesthetic complications: no    Last Vitals:  Vitals:   09/21/16 1515 09/21/16 1530  BP: 128/83 131/78  Pulse: 94 90  Resp: (!) 22 20  Temp:      Last Pain:  Vitals:   09/21/16 1515  TempSrc:   PainSc: 7                  Camree Wigington S

## 2016-09-21 NOTE — Telephone Encounter (Signed)
Scheduled post op appt for the patient, appt will print on the discharge summary

## 2016-09-21 NOTE — Transfer of Care (Signed)
Immediate Anesthesia Transfer of Care Note  Patient: Chrisette Prak  Procedure(s) Performed: Procedure(s): RADICAL VULVECTOMY COMPLETE (N/A)  Patient Location: PACU  Anesthesia Type:General  Level of Consciousness: awake, alert  and oriented  Airway & Oxygen Therapy: Patient Spontanous Breathing and Patient connected to face mask oxygen  Post-op Assessment: Report given to RN and Post -op Vital signs reviewed and stable  Post vital signs: Reviewed and stable  Last Vitals:  Vitals:   09/21/16 1129  BP: (!) 164/91  Pulse: 94  Resp: 20  Temp: 37.2 C    Last Pain:  Vitals:   09/21/16 1140  TempSrc:   PainSc: 2       Patients Stated Pain Goal: 3 (37/16/96 7893)  Complications: No apparent anesthesia complications

## 2016-09-21 NOTE — Op Note (Signed)
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  Operative findings: circumferential ulcerated velvety pagets lesions around entire vulva encroaching on the vaginal introitus and urethral meatus.Complete vaginal obliteration/agglutination. No palpable pelvic mass on rectal exam.  Procedure: The patient was identified in the preoperative holding area. Informed consent was signed on the chart. Patient was seen history was reviewed and exam was performed.   The patient was then taken to the operating room and placed in the supine position with SCD hose on. General anesthesia was then induced without difficulty. She was then placed in the dorsolithotomy position. The perineum was prepped with Betadine. The vagina was prepped with Betadine. The patient was then draped after the prep was dried. A Foley catheter was inserted into the bladder under sterile conditions.  Timeout was performed the patient, procedure, antibiotic, allergy, and length of procedure.The vulvar tissues were inspected for areas ofpagetoid change. The lesion was identified and the marking pen was used to circumscribe the area with appropriate surgical margins. The subcuticular tissues were infiltrated with 1% lidocaine. The 15 blade scalpel was used to make an incision through the skin circumferentially as marked. The skin was grasped and was separated from the underlying deep dermal tissues with the bovie device. The entire labia minora and medial aspect of the labia majora was removed bilaterally including a bridge of affected skin  anteriorally and posteriorally on the peritoneal body. The clitoris was surgically absent from a prior female circumcision.   After the specimen had been completely resected, it was oriented and marked on a cork board. The bovie was used to obtain hemostasis at the surgical bed. The subcutaneous tissues were irrigated and made hemostatic with figure of 8 3-0 vicryl sutures.  For approximately 90 minutes the incision was closed by slightly undermining the skin edges laterally to bring them into apposition with the vaginal incision.   The deep dermal layer was approximated with 2-0 and 3-0vicryl mattress sutures to bring the skin edges into approximation and off tension. The wound was closed following langher's lines.   6 sutures of 2-0 prolene was used to create retention sutures which took the incision line of tension. The cutaneous layer was closed with interrupted 4-0 vicryl stitches and mattress sutures to ensure a tension free and hemostatic closure. The perineum was again irrigated. The foley was kept in situ with a plan to keep in place for 1 week due to the circumferential urethral meatus incision and anticipated edema.  All instrument, suture, laparotomy, Ray-Tec, and needle counts were correct x2. The patient tolerated the procedure well and was taken recovery room in stable condition. This is Everitt Amber dictating an operative note on Mimie Riggin.

## 2016-09-21 NOTE — H&P (View-Only) (Signed)
Consult Note: Gyn-Onc  Consult was requested by Dr. Hulan Fray for the evaluation of Ellen Ryan 61 y.o. female  CC:  Chief Complaint  Patient presents with  . Pagets Disease of the Vulva    Assessment/Plan:  Ellen Ryan  is a 61 y.o.  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  It is very difficult to assess the extent of the pagets due to her intolerance of an office exam. I can see that it at least involves the circumferential labia minora. It is unclear how closely it extends to the urethral meatus.   I recommend examination under anesthesia with complete bilateral radical vulvectomy, possible transposition flaps. If the disease is more extensive than what is appreciated on office exam, we will abort the procedure and plan for a combined procedure with plastic surgery for flap placement.  I do not think that she is a good candidate for non-surgical management such as Aldara as she is unable to tolerate manipulation of the tissues.  I discussed risks of surgery, particularly wound healing and separation and damage to adjacent structures.  HPI: Ellen Ryan is a 61 year old P6 who is seen in consultation at the request of [name] for extramammary pagets disease.   The patient is from [place] and is visiting at present. She reports 2 years of vulvar ulceration. She also has symptoms of trapping urine in the vulvar folds.  She requires opiods for her vulvar ulceration pain.  She was seen by [name] for this problem in April, 2018 and was taken to the OR for an examination under anesthesia on 09/01/16. Extensive ulceration was present. 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    Current Meds:  Outpatient Encounter Prescriptions as of 09/15/2016  Medication Sig  . acetaminophen-codeine (TYLENOL #3) 300-30 MG tablet Take 1 tablet by mouth every 4 (four) hours as needed.  Marland Kitchen amLODipine (NORVASC) 10 MG tablet Take 1 tablet (10 mg total) by mouth daily.  .  betamethasone dipropionate (DIPROLENE) 0.05 % cream Apply topically 2 (two) times daily.  Marland Kitchen ibuprofen (ADVIL,MOTRIN) 200 MG tablet Take 400 mg by mouth every 6 (six) hours as needed for mild pain.   Marland Kitchen lidocaine (XYLOCAINE) 2 % jelly Apply 1 application topically as needed.  . meloxicam (MOBIC) 15 MG tablet Take 1 tablet (15 mg total) by mouth daily.  . Multiple Vitamin (MULTIVITAMIN WITH MINERALS) TABS tablet Take 1 tablet by mouth daily.  . Omega-3 Fatty Acids (FISH OIL PO) Take 1 capsule by mouth daily.  Marland Kitchen oxybutynin (DITROPAN-XL) 10 MG 24 hr tablet Take 1 tablet (10 mg total) by mouth at bedtime.  Marland Kitchen oxyCODONE-acetaminophen (PERCOCET/ROXICET) 5-325 MG tablet Take 1-2 tablets by mouth every 6 (six) hours as needed.   No facility-administered encounter medications on file as of 09/15/2016.     Allergy: No Known Allergies  Social Hx:   Social History   Social History  . Marital status: Widowed    Spouse name: N/A  . Number of children: N/A  . Years of education: N/A   Occupational History  . Not on file.   Social History Main Topics  . Smoking status: Never Smoker  . Smokeless tobacco: Never Used  . Alcohol use No  . Drug use: No  . Sexual activity: Not on file   Other Topics Concern  . Not on file   Social History Narrative  . No narrative on file    Past Surgical Hx:  Past Surgical  History:  Procedure Laterality Date  . COLPOSCOPY VULVA W/ BIOPSY      Past Medical Hx:  Past Medical History:  Diagnosis Date  . Arthritis    both knees  . Hyperlipidemia   . Hypertension     Past Gynecological History:  SVD x 6 No LMP recorded. Patient is postmenopausal. 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  Family Hx: History reviewed. No pertinent family history.  Review of Systems:  Constitutional  Feels pain  ENT Normal appearing ears and nares bilaterally Skin/Breast  No rash, sores, jaundice, itching, dryness Cardiovascular  No chest pain, shortness of breath, or  edema  Pulmonary  No cough or wheeze.  Gastro Intestinal  No nausea, vomitting, or diarrhoea. No bright red blood per rectum, no abdominal pain, change in bowel movement, or constipation.  Genito Urinary  No frequency, urgency, dysuria, +vulvar pain and leakage of urine Musculo Skeletal  No myalgia, arthralgia, joint swelling or pain  Neurologic  No weakness, numbness, change in gait,  Psychology  No depression, anxiety, insomnia.   Vitals:  Blood pressure (!) 164/87, pulse (!) 104, temperature 98.9 F (37.2 C), temperature source Oral, resp. rate 20, height 4' 10.66" (1.49 m), weight 174 lb 9.6 oz (79.2 kg).  Physical Exam: WD in NAD Neck  Supple NROM, without any enlargements.  Lymph Node Survey No cervical supraclavicular or inguinal adenopathy Cardiovascular  Pulse normal rate, regularity and rhythm. S1 and S2 normal.  Lungs  Clear to auscultation bilateraly, without wheezes/crackles/rhonchi. Good air movement.  Skin  No rash/lesions/breakdown  Psychiatry  Alert and oriented to person, place, and time  Abdomen  Normoactive bowel sounds, abdomen soft, non-tender and obese without evidence of hernia.  Back No CVA tenderness Genito Urinary  Extremely limited due to patient's intolerance to the exam. Velvety pagetoid lesions/ulcers covering bilateral labia minora circumferentially. Clitoral hood covered. Unable to visualize the urethra. Unable to pass finger through vulva due to discomfort.  Rectal  deferred Extremities  No bilateral cyanosis, clubbing or edema.   Donaciano Eva, MD  09/15/2016, 3:49 PM

## 2016-09-21 NOTE — Interval H&P Note (Signed)
History and Physical Interval Note:  09/21/2016 12:05 PM  Ellen Ryan  has presented today for surgery, with the diagnosis of  PAGETS OF THE VULVA  The various methods of treatment have been discussed with the patient and family. After consideration of risks, benefits and other options for treatment, the patient has consented to  Procedure(s): RADICAL VULVECTOMY COMPLETE (N/A) as a surgical intervention .  The patient's history has been reviewed, patient examined, no change in status, stable for surgery.  I have reviewed the patient's chart and labs.  Questions were answered to the patient's satisfaction.     Donaciano Eva

## 2016-09-22 ENCOUNTER — Encounter (HOSPITAL_COMMUNITY): Payer: Self-pay | Admitting: Gynecologic Oncology

## 2016-09-22 LAB — BASIC METABOLIC PANEL
Anion gap: 11 (ref 5–15)
BUN: 8 mg/dL (ref 6–20)
CHLORIDE: 103 mmol/L (ref 101–111)
CO2: 25 mmol/L (ref 22–32)
CREATININE: 0.61 mg/dL (ref 0.44–1.00)
Calcium: 9.2 mg/dL (ref 8.9–10.3)
GFR calc Af Amer: >60 mL/min (ref >60)
GFR calc non Af Amer: >60 mL/min (ref >60)
Glucose, Bld: 171 mg/dL — ABNORMAL HIGH (ref 65–99)
Potassium: 4 mmol/L (ref 3.5–5.1)
SODIUM: 139 mmol/L (ref 135–145)

## 2016-09-22 LAB — CBC
HEMATOCRIT: 32.5 % — AB (ref 36.0–46.0)
HEMOGLOBIN: 10.2 g/dL — AB (ref 12.0–15.0)
MCH: 22.5 pg — AB (ref 26.0–34.0)
MCHC: 31.4 g/dL (ref 30.0–36.0)
MCV: 71.7 fL — ABNORMAL LOW (ref 78.0–100.0)
Platelets: 309 10*3/uL (ref 150–400)
RBC: 4.53 MIL/uL (ref 3.87–5.11)
RDW: 14.9 % (ref 11.5–15.5)
WBC: 14.6 10*3/uL — ABNORMAL HIGH (ref 4.0–10.5)

## 2016-09-22 MED ORDER — OXYCODONE-ACETAMINOPHEN 5-325 MG PO TABS: 1.0000 | ORAL_TABLET | ORAL | 0 refills | Status: DC | PRN

## 2016-09-22 MED ORDER — SENNOSIDES-DOCUSATE SODIUM 8.6-50 MG PO TABS: 2.0000 | ORAL_TABLET | Freq: Every day | ORAL | 0 refills | Status: DC

## 2016-09-22 NOTE — Discharge Instructions (Addendum)
Sitz baths three times a day.  Use the peri-bottle to cleanse the area after having a bowel movement.   Vulvectomy, Care After The vulva is the external female genitalia, outside and around the vagina and pubic bone. It consists of:  The skin on, and in front of, the pubic bone.  The clitoris.  The labia majora (large lips) on the outside of the vagina.  The labia minora (small lips) around the opening of the vagina.  The opening and the skin in and around the vagina. A vulvectomy is the removal of the tissue of the vulva, which sometimes includes removal of the lymph nodes and tissue in the groin areas. These discharge instructions provide you with general information on caring for yourself after you leave the hospital. It is also important that you know the warning signs of complications, so that you can seek treatment. Please read the instructions outlined below and refer to this sheet in the next few weeks. Your caregiver may also give you specific information and medicines. If you have any questions or complications after discharge, please call your caregiver. ACTIVITY  Rest as much as possible the first two weeks after discharge.  Arrange to have help from family or others with your daily activities when you go home.  Avoid heavy lifting (more than 5 pounds), pushing, or pulling.  If you feel tired, balance your activity with rest periods.  Follow your caregiver's instruction about climbing stairs and driving a car.  Increase activity gradually.  Do not exercise until you have permission from your caregiver. LEG AND FOOT CARE If your doctor has removed lymph nodes from your groin area, there may be an increase in swelling of your legs and feet. You can help prevent swelling by doing the following:  Elevate your legs while sitting or lying down.  If your caregiver has ordered special stockings, wear them according to instructions.  Avoid standing in one place for long  periods of time.  Call the physical therapy department if you have any questions about swelling or treatment for swelling.  Avoid salt in your diet. It can cause fluid retention and swelling.  Do not Magie Ciampa your legs, especially when sitting. NUTRITION  You may resume your normal diet.  Drink 6 to 8 glasses of fluids a day.  Eat a healthy, balanced diet including portions of food from the meat (protein), milk, fruit, vegetable, and bread groups.  Your caregiver may recommend you take a multivitamin with iron. ELIMINATION  You may notice that your stream of urine is at a different angle, and may tend to spray. Using a plastic funnel may help to decrease urine spray.  If constipation occurs, drink more liquids, and add more fruits, vegetables, and bran to your diet. You may take a mild laxative, such as Milk of Magnesia, Metamucil, or a stool softener such as Colace, with permission from your caregiver. HYGIENE  You may shower and wash your hair.  Check with your caregiver about tub baths.  Do not add any bath oils or chemicals to your bath water, after you have permission to take baths.  While passing urine, pour water from a bottle or spray over your vulva to dilute the urine as it passes the incision (this will decrease burning and discomfort).  Clean yourself well after moving your bowels.  After urinating, do not wipe. Dap or pat dry with toilet paper or a dry cleath soft cloth.  A sitz bath will help keep your perineal  area clean, reduce swelling, and provide comfort.  Avoid wearing underpants for the first 2 weeks and wear loose skirts to allow circulation of air around the incision  You do not need to apply dressings, salves or lotions to the wound.  The stitches are self-dissolving and will absorb and disappear over a couple of months (it is normal to notice the knot from the stitches on toilet paper after voiding). HOME CARE INSTRUCTIONS   Apply a soft ice pack (or  frozen bag of peas) to your perineum (vulva) every hour in the first 48 hours after surgery. This will reduce swelling.  Avoid activities that involve a lot of friction between your legs.  Avoid wearing pants or underpants in the 1st 2 weeks (skirts are preferable).  Take your temperature twice a day and record it, especially if you feel feverish or have chills.  Follow your caregiver's instructions about medicines, activity, and follow-up appointments after surgery.  Do not drink alcohol while taking pain medicine.  Change your dressing as advised by your caregiver.  You may take over-the-counter medicine for pain, recommended by your caregiver.  If your pain is not relieved with medicine, call your caregiver.  Do not take aspirin because it can cause bleeding.  Do not douche or use tampons (use a nonperfumed sanitary pad).  Do not have sexual intercourse until your caregiver gives you permission (typically 6 weeks postoperatively). Hugging, kissing, and playful sexual activity is fine with your caregiver's permission.  Warm sitz baths, with your caregiver's permission, are helpful to control swelling and discomfort.  Take showers instead of baths, until your caregiver gives you permission to take baths.  You may take a mild medicine for constipation, recommended by your caregiver. Bran foods and drinking a lot of fluids will help with constipation.  Make sure your family understands everything about your operation and recovery. SEEK MEDICAL CARE IF:   You notice swelling and redness around the wound area.  You notice a foul smell coming from the wound or on the surgical dressing.  You notice the wound is separating.  You have painful or bloody urination.  You develop nausea and vomiting.  You develop diarrhea.  You develop a rash.  You have a reaction or allergy from the medicine.  You feel dizzy or light-headed.  You need stronger pain medicine. SEEK IMMEDIATE  MEDICAL CARE IF:   You develop a temperature of 102 F (38.9 C) or higher.  You pass out.  You develop leg or chest pain.  You develop abdominal pain.  You develop shortness of breath.  You develop bleeding from the wound area.  You see pus in the wound area. MAKE SURE YOU:   Understand these instructions.  Will watch your condition.  Will get help right away if you are not doing well or get worse. Document Released: 11/23/2003 Document Revised: 08/25/2013 Document Reviewed: 03/12/2009 [facility] Patient Information 2015 [place], [place]. This information is not intended to replace advice given to you by your health care provider. Make sure you discuss any questions you have with your health care provider.   Indwelling Urinary Catheter Care, Adult Take good care of your catheter to keep it working and to prevent problems. How to wear your catheter Attach your catheter to your leg with tape (adhesive tape) or a leg strap. Make sure it is not too tight. If you use tape, remove any bits of tape that are already on the catheter. How to wear a drainage bag You  should have:  A large overnight bag.  A small leg bag.  Overnight Bag You may wear the overnight bag at any time. Always keep the bag below the level of your bladder but off the floor. When you sleep, put a clean plastic bag in a wastebasket. Then hang the bag inside the wastebasket. Leg Bag Never wear the leg bag at night. Always wear the leg bag below your knee. Keep the leg bag secure with a leg strap or tape. How to care for your skin  Clean the skin around the catheter at least once every day.  Shower every day. Do not take baths.  Put creams, lotions, or ointments on your genital area only as told by your doctor.  Do not use powders, sprays, or lotions on your genital area. How to clean your catheter and your skin 1. Wash your hands with soap and water. 2. Wet a washcloth in warm water and gentle (mild)  soap. 3. Use the washcloth to clean the skin where the catheter enters your body. Clean downward and wipe away from the catheter in small circles. Do not wipe toward the catheter. 4. Pat the area dry with a clean towel. Make sure to clean off all soap. How to care for your drainage bags Empty your drainage bag when it is ?- full or at least 2-3 times a day. Replace your drainage bag once a month or sooner if it starts to smell bad or look dirty. Do not clean your drainage bag unless told by your doctor. Emptying a drainage bag  Supplies Needed  Rubbing alcohol.  Gauze pad or cotton ball.  Tape or a leg strap.  Steps 1. Wash your hands with soap and water. 2. Separate (detach) the bag from your leg. 3. Hold the bag over the toilet or a clean container. Keep the bag below your hips and bladder. This stops pee (urine) from going back into the tube. 4. Open the pour spout at the bottom of the bag. 5. Empty the pee into the toilet or container. Do not let the pour spout touch any surface. 6. Put rubbing alcohol on a gauze pad or cotton ball. 7. Use the gauze pad or cotton ball to clean the pour spout. 8. Close the pour spout. 9. Attach the bag to your leg with tape or a leg strap. 10. Wash your hands.  Changing a drainage bag Supplies Needed  Alcohol wipes.  A clean drainage bag.  Adhesive tape or a leg strap.  Steps 1. Wash your hands with soap and water. 2. Separate the dirty bag from your leg. 3. Pinch the rubber catheter with your fingers so that pee does not spill out. 4. Separate the catheter tube from the drainage tube where these tubes connect (at the connection valve). Do not let the tubes touch any surface. 5. Clean the end of the catheter tube with an alcohol wipe. Use a different alcohol wipe to clean the end of the drainage tube. 6. Connect the catheter tube to the drainage tube of the clean bag. 7. Attach the new bag to the leg with adhesive tape or a leg  strap. 8. Wash your hands.  How to prevent infection and other problems  Never pull on your catheter or try to remove it. Pulling can damage tissue in your body.  Always wash your hands before and after touching your catheter.  If a leg strap gets wet, replace it with a dry one.  Drink enough  fluids to keep your pee clear or pale yellow, or as told by your doctor.  Do not let the drainage bag or tubing touch the floor.  Wear cotton underwear.  If you are female, wipe from front to back after you poop (have a bowel movement).  Check on the catheter often to make sure it works and the tubing is not twisted. Get help if:  Your pee is cloudy.  Your pee smells unusually bad.  Your pee is not draining into the bag.  Your tube gets clogged.  Your catheter starts to leak.  Your bladder feels full. Get help right away if:  You have redness, swelling, or pain where the catheter enters your body.  You have fluid, pus, or a bad smell coming from the area where the catheter enters your body.  The area where the catheter enters your body feels warm.  You have a fever.  You have pain in your: ? Stomach (abdomen). ? Legs. ? Lower back. ? Bladder.  You see blood fill the catheter.  Your pee is pink or red.  You feel sick to your stomach (nauseous).  You throw up (vomit).  You have chills.  Your catheter gets pulled out. This information is not intended to replace advice given to you by your health care provider. Make sure you discuss any questions you have with your health care provider. Document Released: 08/05/2012 Document Revised: 03/08/2016 Document Reviewed: 09/23/2013 Elsevier Interactive Patient Education  2018 [employer].  Acetaminophen; Oxycodone tablets What is this medicine? ACETAMINOPHEN; OXYCODONE (a set a MEE noe fen; ox i KOE done) is a pain reliever. It is used to treat moderate to severe pain. This medicine may be used for other purposes; ask  your health care provider or pharmacist if you have questions. COMMON BRAND NAME(S): Endocet, Magnacet, Narvox, Percocet, Perloxx, Primalev, Primlev, Roxicet, Xolox What should I tell my health care provider before I take this medicine? They need to know if you have any of these conditions: -brain tumor -Crohn's disease, inflammatory bowel disease, or ulcerative colitis -drug abuse or addiction -head injury -heart or circulation problems -if you often drink alcohol -kidney disease or problems going to the bathroom -liver disease -lung disease, asthma, or breathing problems -an unusual or allergic reaction to acetaminophen, oxycodone, other opioid analgesics, other medicines, foods, dyes, or preservatives -pregnant or trying to get pregnant -breast-feeding How should I use this medicine? Take this medicine by mouth with a full glass of water. Follow the directions on the prescription label. You can take it with or without food. If it upsets your stomach, take it with food. Take your medicine at regular intervals. Do not take it more often than directed. A special MedGuide will be given to you by the pharmacist with each prescription and refill. Be sure to read this information carefully each time. Talk to your pediatrician regarding the use of this medicine in children. Special care may be needed. Overdosage: If you think you have taken too much of this medicine contact a poison control center or emergency room at once. NOTE: This medicine is only for you. Do not share this medicine with others. What if I miss a dose? If you miss a dose, take it as soon as you can. If it is almost time for your next dose, take only that dose. Do not take double or extra doses. What may interact with this medicine? This medicine may interact with the following medications: -alcohol -antihistamines for allergy, cough  and cold -antiviral medicines for HIV or AIDS -atropine -certain antibiotics like  clarithromycin, erythromycin, linezolid, rifampin -certain medicines for anxiety or sleep -certain medicines for bladder problems like oxybutynin, tolterodine -certain medicines for depression like amitriptyline, fluoxetine, sertraline -certain medicines for fungal infections like ketoconazole, itraconazole, voriconazole -certain medicines for migraine headache like almotriptan, eletriptan, frovatriptan, naratriptan, rizatriptan, sumatriptan, zolmitriptan -certain medicines for nausea or vomiting like dolasetron, ondansetron, palonosetron -certain medicines for Parkinson's disease like benztropine, trihexyphenidyl -certain medicines for seizures like phenobarbital, phenytoin, primidone -certain medicines for stomach problems like dicyclomine, hyoscyamine -certain medicines for travel sickness like scopolamine -diuretics -general anesthetics like halothane, isoflurane, methoxyflurane, propofol -ipratropium -local anesthetics like lidocaine, pramoxine, tetracaine -MAOIs like Carbex, Eldepryl, Marplan, Nardil, and Parnate -medicines that relax muscles for surgery -methylene blue -nilotinib -other medicines with acetaminophen -other narcotic medicines for pain or cough -phenothiazines like chlorpromazine, mesoridazine, prochlorperazine, thioridazine This list may not describe all possible interactions. Give your health care provider a list of all the medicines, herbs, non-prescription drugs, or dietary supplements you use. Also tell them if you smoke, drink alcohol, or use illegal drugs. Some items may interact with your medicine. What should I watch for while using this medicine? Tell your doctor or health care professional if your pain does not go away, if it gets worse, or if you have new or a different type of pain. You may develop tolerance to the medicine. Tolerance means that you will need a higher dose of the medication for pain relief. Tolerance is normal and is expected if you take this  medicine for a long time. Do not suddenly stop taking your medicine because you may develop a severe reaction. Your body becomes used to the medicine. This does NOT mean you are addicted. Addiction is a behavior related to getting and using a drug for a non-medical reason. If you have pain, you have a medical reason to take pain medicine. Your doctor will tell you how much medicine to take. If your doctor wants you to stop the medicine, the dose will be slowly lowered over time to avoid any side effects. There are different types of narcotic medicines (opiates). If you take more than one type at the same time or if you are taking another medicine that also causes drowsiness, you may have more side effects. Give your health care provider a list of all medicines you use. Your doctor will tell you how much medicine to take. Do not take more medicine than directed. Call emergency for help if you have problems breathing or unusual sleepiness. Do not take other medicines that contain acetaminophen with this medicine. Always read labels carefully. If you have questions, ask your doctor or pharmacist. If you take too much acetaminophen get medical help right away. Too much acetaminophen can be very dangerous and cause liver damage. Even if you do not have symptoms, it is important to get help right away. You may get drowsy or dizzy. Do not drive, use machinery, or do anything that needs mental alertness until you know how this medicine affects you. Do not stand or sit up quickly, especially if you are an older patient. This reduces the risk of dizzy or fainting spells. Alcohol may interfere with the effect of this medicine. Avoid alcoholic drinks. The medicine will cause constipation. Try to have a bowel movement at least every 2 to 3 days. If you do not have a bowel movement for 3 days, call your doctor or health care professional. Your mouth may get  dry. Chewing sugarless gum or sucking hard candy, and drinking  plenty or water may help. Contact your doctor if the problem does not go away or is severe. What side effects may I notice from receiving this medicine? Side effects that you should report to your doctor or health care professional as soon as possible: -allergic reactions like skin rash, itching or hives, swelling of the face, lips, or tongue -breathing problems -confusion -redness, blistering, peeling or loosening of the skin, including inside the mouth -signs and symptoms of liver injury like dark yellow or brown urine; general ill feeling or flu-like symptoms; light-colored stools; loss of appetite; nausea; right upper belly pain; unusually weak or tired; yellowing of the eyes or skin -signs and symptoms of low blood pressure like dizziness; feeling faint or lightheaded, falls; unusually weak or tired -trouble passing urine or change in the amount of urine Side effects that usually do not require medical attention (report to your doctor or health care professional if they continue or are bothersome): -constipation -dry mouth -nausea, vomiting -tiredness This list may not describe all possible side effects. Call your doctor for medical advice about side effects. You may report side effects to FDA at 1-800-FDA-1088. Where should I keep my medicine? Keep out of the reach of children. This medicine can be abused. Keep your medicine in a safe place to protect it from theft. Do not share this medicine with anyone. Selling or giving away this medicine is dangerous and against the law. This medicine may cause accidental overdose and death if it taken by other adults, children, or pets. Mix any unused medicine with a substance like cat litter or coffee grounds. Then throw the medicine away in a sealed container like a sealed bag or a coffee can with a lid. Do not use the medicine after the expiration date. Store at room temperature between 20 and 25 degrees C (68 and 77 degrees F). NOTE: This sheet is  a summary. It may not cover all possible information. If you have questions about this medicine, talk to your doctor, pharmacist, or health care provider.  2018 Elsevier/Gold Standard (2015-01-04 21:48:12)

## 2016-09-22 NOTE — Discharge Summary (Signed)
Physician Discharge Summary  Patient ID: Ellen Ryan MRN: [id number] DOB/AGE: Sep 09, 1955 61 y.o.  Admit date: 09/21/2016 Discharge date: 09/22/2016  Admission Diagnoses: Paget's disease of vulva  Discharge Diagnoses:  Principal Problem:   Paget's disease of vulva   Discharged Condition:  The patient is in good condition and stable for discharge.    Hospital Course: On 09/21/2016, the patient underwent the following: Procedure(s): RADICAL VULVECTOMY COMPLETE.   The postoperative course was uneventful.  She was discharged to home on postoperative day 1 tolerating a regular diet, foley in place, ambulating, pain controlled.  Consults: None  Significant Diagnostic Studies: None  Treatments: surgery: see above  Discharge Exam: Blood pressure (!) 143/76, pulse (!) 115, temperature 98.2 F (36.8 C), temperature source Oral, resp. rate 16, height 4\' 10"  (1.473 m), weight 174 lb 11.2 oz (79.2 kg), SpO2 96 %. General appearance: alert, cooperative and no distress Resp: clear to auscultation bilaterally Cardio: regular rhythm, mildly tachycardic at times GI: soft, non-tender; bowel sounds normal; no masses,  no organomegaly Extremities: extremities normal, atraumatic, no cyanosis or edema Incision/Wound: Vulvar incision intact, minimal amount of serosanguinous drainage  Disposition: 01-Home or Self Care  Discharge Instructions    Call MD for:  difficulty breathing, headache or visual disturbances    Complete by:  As directed    Call MD for:  extreme fatigue    Complete by:  As directed    Call MD for:  hives    Complete by:  As directed    Call MD for:  persistant dizziness or light-headedness    Complete by:  As directed    Call MD for:  persistant nausea and vomiting    Complete by:  As directed    Call MD for:  redness, tenderness, or signs of infection (pain, swelling, redness, odor or green/yellow discharge around incision site)    Complete by:  As directed    Call MD for:   severe uncontrolled pain    Complete by:  As directed    Call MD for:  temperature >100.4    Complete by:  As directed    Diet - low sodium heart healthy    Complete by:  As directed    Discharge instructions    Complete by:  As directed    Sitz baths two to three times daily   Driving Restrictions    Complete by:  As directed    No driving for 1 week if you were cleared to drive before surgery.  Do not take narcotics and drive.   Increase activity slowly    Complete by:  As directed    Lifting restrictions    Complete by:  As directed    No lifting greater than 10 lbs.   Sexual Activity Restrictions    Complete by:  As directed    No sexual activity, nothing in the vagina, for 6 weeks.     Allergies as of 09/22/2016   No Known Allergies     Medication List    STOP taking these medications   acetaminophen-codeine 300-30 MG tablet Commonly known as:  TYLENOL #3   betamethasone dipropionate 0.05 % cream Commonly known as:  DIPROLENE     TAKE these medications   amLODipine 10 MG tablet Commonly known as:  NORVASC Take 1 tablet (10 mg total) by mouth daily.   FISH OIL PO Take 1 capsule by mouth daily.   ibuprofen 200 MG tablet Commonly known as:  ADVIL,MOTRIN Take 600 mg  by mouth every 6 (six) hours as needed for mild pain.   lidocaine 2 % jelly Commonly known as:  XYLOCAINE Apply 1 application topically as needed. What changed:  when to take this  reasons to take this   meloxicam 15 MG tablet Commonly known as:  MOBIC Take 1 tablet (15 mg total) by mouth daily.   multivitamin with minerals Tabs tablet Take 1 tablet by mouth daily. Centrum   oxybutynin 10 MG 24 hr tablet Commonly known as:  DITROPAN-XL Take 1 tablet (10 mg total) by mouth at bedtime.   oxyCODONE-acetaminophen 5-325 MG tablet Commonly known as:  PERCOCET/ROXICET Take 1-2 tablets by mouth every 4 (four) hours as needed (moderate to severe pain). What changed:  when to take  this  reasons to take this   senna-docusate 8.6-50 MG tablet Commonly known as:  Senokot-S Take 2 tablets by mouth at bedtime.      Follow-up Information    Cross, Lenna Sciara D, NP In 1 week.   Specialty:  Gynecologic Oncology Contact information: [place] [address] [phone]        Everitt Amber, MD In 2 weeks.   Specialty:  Obstetrics and Gynecology Why:  For suture removal Contact information: [place] [address] [phone]           Greater than thirty minutes were spend for face to face discharge instructions and discharge orders/summary in EPIC.   Signed: CROSS, MELISSA DEAL 09/22/2016, 12:12 PM

## 2016-09-28 ENCOUNTER — Encounter: Payer: Self-pay | Admitting: Gynecologic Oncology

## 2016-09-28 ENCOUNTER — Ambulatory Visit: Payer: Self-pay | Attending: Gynecologic Oncology | Admitting: Gynecologic Oncology

## 2016-09-28 VITALS — BP 147/88 | HR 105 | Temp 98.5°F | Resp 22

## 2016-09-28 DIAGNOSIS — C519 Malignant neoplasm of vulva, unspecified: Secondary | ICD-10-CM | POA: Insufficient documentation

## 2016-09-28 DIAGNOSIS — Z9889 Other specified postprocedural states: Secondary | ICD-10-CM | POA: Insufficient documentation

## 2016-09-28 DIAGNOSIS — Z09 Encounter for follow-up examination after completed treatment for conditions other than malignant neoplasm: Secondary | ICD-10-CM | POA: Insufficient documentation

## 2016-09-28 DIAGNOSIS — E785 Hyperlipidemia, unspecified: Secondary | ICD-10-CM | POA: Insufficient documentation

## 2016-09-28 DIAGNOSIS — C4499 Other specified malignant neoplasm of skin, unspecified: Secondary | ICD-10-CM

## 2016-09-28 DIAGNOSIS — I10 Essential (primary) hypertension: Secondary | ICD-10-CM | POA: Insufficient documentation

## 2016-09-28 NOTE — Patient Instructions (Signed)
Doing great!  Plan to follow up as scheduled or sooner if needed.

## 2016-09-29 ENCOUNTER — Encounter: Payer: Self-pay | Admitting: Gynecologic Oncology

## 2016-09-29 NOTE — Progress Notes (Signed)
Follow Up Note: Gyn-Onc  [name] 61 y.o. female  CC:  Chief Complaint  Patient presents with  . Paget's Disease of the Vulva  . 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  The patient is visiting her daughter now and lives in [place]. She reported a 2 year history of vulvar ulceration. She was seen by [name] for this problem in April, 2018 and was taken to the OR for an examination under anesthesia on 09/01/16. Extensive ulceration was present. 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    On 09/21/16, she underwent a complete simple vulvectomy by Dr. Everitt Amber.  Final pathology is pending. Post-operative course was uneventful and she was discharged with a foley catheter to be removed one week post-op.  Interval History:  She presents today to the office with her daughter for foley removal post-op and for incision check.  She reports doing well since surgery.  Reports minimal pain.  Ambulating without difficulty.  Tolerating diet with no nausea or emesis reported.  Bowels functioning without difficulty.  Reporting adequate urine output from her foley.  Minimal amount of serosanguinous drainage from the surgical site.  No concerns voiced.    Review of Systems Constitutional: Feels well.  No fever, chills, early satiety, change in appetite.   Cardiovascular: No chest pain, shortness of breath, or edema.  Pulmonary: No cough or wheeze.  Gastrointestinal: No nausea, vomiting, or diarrhea. No bright red blood per rectum or change in bowel movement.  Genitourinary: No frequency, urgency, or dysuria. No vaginal bleeding or discharge.  Musculoskeletal: No myalgia or joint pain Neurologic: No weakness, numbness, or change in gait.  Psychology: No depression, anxiety, or insomnia  Current Meds:  Outpatient Encounter Prescriptions as of 09/28/2016  Medication  Sig  . amLODipine (NORVASC) 10 MG tablet Take 1 tablet (10 mg total) by mouth daily.  Marland Kitchen ibuprofen (ADVIL,MOTRIN) 200 MG tablet Take 600 mg by mouth every 6 (six) hours as needed for mild pain.   Marland Kitchen lidocaine (XYLOCAINE) 2 % jelly Apply 1 application topically as needed. (Patient taking differently: Apply 1 application topically 3 (three) times daily as needed (for pain.). )  . meloxicam (MOBIC) 15 MG tablet Take 1 tablet (15 mg total) by mouth daily.  . Multiple Vitamin (MULTIVITAMIN WITH MINERALS) TABS tablet Take 1 tablet by mouth daily. Centrum  . Omega-3 Fatty Acids (FISH OIL PO) Take 1 capsule by mouth daily.  Marland Kitchen oxybutynin (DITROPAN-XL) 10 MG 24 hr tablet Take 1 tablet (10 mg total) by mouth at bedtime.  Marland Kitchen oxyCODONE-acetaminophen (PERCOCET/ROXICET) 5-325 MG tablet Take 1-2 tablets by mouth every 4 (four) hours as needed (moderate to severe pain).  Marland Kitchen senna-docusate (SENOKOT-S) 8.6-50 MG tablet Take 2 tablets by mouth at bedtime.   No facility-administered encounter medications on file as of 09/28/2016.     Allergy: No Known Allergies  Social Hx:   Social History   Social History  . Marital status: Widowed    Spouse name: N/A  . Number of children: N/A  . Years of education: N/A   Occupational History  . Not on file.   Social History Main Topics  . Smoking status: Never Smoker  . Smokeless tobacco: Never Used  . Alcohol use Yes     Comment: OCCASIONALLY   . Drug use: No  . Sexual activity:  Not on file   Other Topics Concern  . Not on file   Social History Narrative  . No narrative on file    Past Surgical Hx:  Past Surgical History:  Procedure Laterality Date  . COLPOSCOPY VULVA W/ BIOPSY    . RADICAL VULVECTOMY N/A 09/21/2016   Procedure: RADICAL VULVECTOMY COMPLETE;  Surgeon: Everitt Amber, MD;  Location: [facility];  Service: Gynecology;  Laterality: N/A;    Past Medical Hx:  Past Medical History:  Diagnosis Date  . Arthritis    both knees  . Hyperlipidemia   .  Hypertension     Family Hx: History reviewed. No pertinent family history.  Vitals:  Blood pressure (!) 147/88, pulse (!) 105, temperature 98.5 F (36.9 C), resp. rate (!) 22.  Physical Exam:  General: Well developed, well nourished female in no acute distress. Alert and oriented x 3.  Cardiovascular: Regular rate and rhythm. S1 and S2 normal.  Lungs: Clear to auscultation bilaterally. No wheezes/crackles/rhonchi noted.  Skin: No rashes or lesions present. Back: No CVA tenderness.  Abdomen: Abdomen soft, non-tender and obese. Active bowel sounds in all quadrants. No evidence of a fluid wave or abdominal masses.  Genitourinary:    Vulva/vagina: Vulvectomy incision with sutures intact, minimal amount of serosanguinous drainage present, no signs of infection  Extremities: No bilateral cyanosis, edema, or clubbing.  Foley removed at 10:50am without difficulty.  Clear, yellow urine noted in the leg bag.   Assessment/Plan:  61 year old female s/p complete vulvectomy for extramammary pagets disease of the vulva.  She is doing well post-operatively.  Reportable signs and symptoms reviewed. Patient's daughter is an Therapist, sports and states she will monitor for urinary retention or call for any issues voiding.  Advised to call for any questions or concerns and to follow up as scheduled on June 13 with Dr. Denman George.   Mcgregor Tinnon DEAL, NP 09/29/2016, 3:29 PM

## 2016-10-02 ENCOUNTER — Telehealth: Payer: Self-pay

## 2016-10-02 NOTE — Telephone Encounter (Signed)
-----   Message from Everitt Amber, MD sent at 10/02/2016  4:06 PM EDT ----- Please let Ms Brinkley know that her surgery showed precancer and no evidence of cancer. The edges of the specimen were negative for dysplasia. No further treatment necessary.

## 2016-10-02 NOTE — Telephone Encounter (Signed)
LMfor daughter to call back tomorrow for the results of the pathology.

## 2016-10-03 NOTE — Telephone Encounter (Signed)
Told daughter the results of the pathology as noted below by Dr. Denman George.  Pt to keep post op appointment tomorrow as scheduled.

## 2016-10-04 ENCOUNTER — Encounter: Payer: Self-pay | Admitting: Gynecologic Oncology

## 2016-10-04 ENCOUNTER — Ambulatory Visit: Payer: Self-pay | Attending: Gynecologic Oncology | Admitting: Gynecologic Oncology

## 2016-10-04 VITALS — BP 153/80 | HR 82 | Temp 97.7°F | Resp 18 | Wt 175.0 lb

## 2016-10-04 DIAGNOSIS — D071 Carcinoma in situ of vulva: Secondary | ICD-10-CM | POA: Insufficient documentation

## 2016-10-04 DIAGNOSIS — Z79899 Other long term (current) drug therapy: Secondary | ICD-10-CM | POA: Insufficient documentation

## 2016-10-04 DIAGNOSIS — I10 Essential (primary) hypertension: Secondary | ICD-10-CM | POA: Insufficient documentation

## 2016-10-04 DIAGNOSIS — C519 Malignant neoplasm of vulva, unspecified: Secondary | ICD-10-CM | POA: Insufficient documentation

## 2016-10-04 DIAGNOSIS — C4499 Other specified malignant neoplasm of skin, unspecified: Secondary | ICD-10-CM

## 2016-10-04 DIAGNOSIS — E785 Hyperlipidemia, unspecified: Secondary | ICD-10-CM | POA: Insufficient documentation

## 2016-10-04 NOTE — Patient Instructions (Signed)
Please return to see Dr Denman George in 2 weeks. Continue to use your neosporin and spray bottle.

## 2016-10-04 NOTE — Progress Notes (Signed)
Follow Up Note: Gyn-Onc  Ellen Ryan 61 y.o. female  CC:  Chief Complaint  Patient presents with  . 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  She is doing very well post-operatively.  She no longer has pain.  Sutures removed today (5). However, there may be a single remaining prolene - difficult to see due to patient difficult to evaluate due to patient discomfort.  No sign of infection or separation.   Will see her back in 2 weeks to re-evaluate healing.   I discussed her pathology with her today. She will require annual checks of the vulva for signs of recurrent VIN3, but paps will be unlikely to be of benefit due to vaginal agglutination.   HPI:   Ellen Ryan is 61 year old female initially seen at the request of [name] for extramammary pagets disease.  The patient is visiting her daughter now and lives in [place]. She reported a 2 year history of vulvar ulceration. She was seen by [name] for this problem in April, 2018 and was taken to the OR for an examination under anesthesia on 09/01/16. Extensive ulceration was present. 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    On 09/21/16, she underwent a complete simple vulvectomy.  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 No invasive carcinoma, negative margins. Post-operative course was uneventful and she was discharged with a foley catheter for one week post-op.  Interval History:  She presents today to the office with her daughter for scheduled follow-up. She is doing very well postop. Denies any pain. Is tearful and grateful at resolution of symptoms. She is voiding without difficulty.  Review of Systems Constitutional: Feels well.  No fever, chills, early satiety, change in appetite.   Cardiovascular: No chest pain, shortness of breath, or edema.  Pulmonary: No cough or wheeze.   Gastrointestinal: No nausea, vomiting, or diarrhea. No bright red blood per rectum or change in bowel movement.  Genitourinary: No frequency, urgency, or dysuria. No vaginal bleeding or discharge.  Musculoskeletal: No myalgia or joint pain Neurologic: No weakness, numbness, or change in gait.  Psychology: No depression, anxiety, or insomnia  Current Meds:  Outpatient Encounter Prescriptions as of 10/04/2016  Medication Sig  . amLODipine (NORVASC) 10 MG tablet Take 1 tablet (10 mg total) by mouth daily.  Marland Kitchen ibuprofen (ADVIL,MOTRIN) 200 MG tablet Take 600 mg by mouth every 6 (six) hours as needed for mild pain.   Marland Kitchen lidocaine (XYLOCAINE) 2 % jelly Apply 1 application topically as needed. (Patient taking differently: Apply 1 application topically 3 (three) times daily as needed (for pain.). )  . meloxicam (MOBIC) 15 MG tablet Take 1 tablet (15 mg total) by mouth daily.  . Multiple Vitamin (MULTIVITAMIN WITH MINERALS) TABS tablet Take 1 tablet by mouth daily. Centrum  . Omega-3 Fatty Acids (FISH OIL PO) Take 1 capsule by mouth daily.  Marland Kitchen oxybutynin (DITROPAN-XL) 10 MG 24 hr tablet Take 1 tablet (10 mg total) by mouth at bedtime.  Marland Kitchen oxyCODONE-acetaminophen (PERCOCET/ROXICET) 5-325 MG tablet Take 1-2 tablets by mouth every 4 (four) hours as needed (moderate to severe pain).  Marland Kitchen senna-docusate (SENOKOT-S) 8.6-50 MG tablet Take 2 tablets by mouth at bedtime.   No facility-administered encounter medications on file as of 10/04/2016.     Allergy: No Known Allergies  Social Hx:   Social History  Social History  . Marital status: Widowed    Spouse name: N/A  . Number of children: N/A  . Years of education: N/A   Occupational History  . Not on file.   Social History Main Topics  . Smoking status: Never Smoker  . Smokeless tobacco: Never Used  . Alcohol use Yes     Comment: OCCASIONALLY   . Drug use: No  . Sexual activity: Not on file   Other Topics Concern  . Not on file   Social  History Narrative  . No narrative on file    Past Surgical Hx:  Past Surgical History:  Procedure Laterality Date  . COLPOSCOPY VULVA W/ BIOPSY    . RADICAL VULVECTOMY N/A 09/21/2016   Procedure: RADICAL VULVECTOMY COMPLETE;  Surgeon: Everitt Amber, MD;  Location: [facility];  Service: Gynecology;  Laterality: N/A;    Past Medical Hx:  Past Medical History:  Diagnosis Date  . Arthritis    both knees  . Hyperlipidemia   . Hypertension     Family Hx: History reviewed. No pertinent family history.  Vitals:  Blood pressure (!) 153/80, pulse 82, temperature 97.7 F (36.5 C), resp. rate 18, weight 175 lb (79.4 kg), SpO2 98 %.  Physical Exam:  General: Well developed, well nourished female in no acute distress. Alert and oriented x 3.  Cardiovascular: Regular rate and rhythm. S1 and S2 normal.  Lungs: Clear to auscultation bilaterally. No wheezes/crackles/rhonchi noted.  Skin: No rashes or lesions present. Back: No CVA tenderness.  Abdomen: Abdomen soft, non-tender and obese. Active bowel sounds in all quadrants. No evidence of a fluid wave or abdominal masses.  Genitourinary:    Vulva/vagina: Vulvectomy incision with sutures intact - removed, no drainage present, no signs of infection, no separation Extremities: No bilateral cyanosis, edema, or clubbing.   Donaciano Eva, MD 10/04/2016, 2:13 PM

## 2016-10-11 ENCOUNTER — Ambulatory Visit: Payer: Self-pay | Admitting: Gynecologic Oncology

## 2016-10-24 MED FILL — AMLODIPINE BESYLATE 5 MG TA: 5 | 30 days supply | Qty: 30 | Fill #2

## 2016-10-27 ENCOUNTER — Ambulatory Visit: Payer: Self-pay | Attending: Gynecologic Oncology | Admitting: Gynecologic Oncology

## 2016-10-27 ENCOUNTER — Encounter: Payer: Self-pay | Admitting: Gynecologic Oncology

## 2016-10-27 VITALS — BP 143/74 | HR 110 | Temp 98.1°F | Resp 20 | Wt 176.0 lb

## 2016-10-27 DIAGNOSIS — D071 Carcinoma in situ of vulva: Secondary | ICD-10-CM

## 2016-10-27 DIAGNOSIS — I10 Essential (primary) hypertension: Secondary | ICD-10-CM | POA: Insufficient documentation

## 2016-10-27 DIAGNOSIS — C519 Malignant neoplasm of vulva, unspecified: Secondary | ICD-10-CM | POA: Insufficient documentation

## 2016-10-27 DIAGNOSIS — M170 Bilateral primary osteoarthritis of knee: Secondary | ICD-10-CM | POA: Insufficient documentation

## 2016-10-27 DIAGNOSIS — E785 Hyperlipidemia, unspecified: Secondary | ICD-10-CM | POA: Insufficient documentation

## 2016-10-27 DIAGNOSIS — N992 Postprocedural adhesions of vagina: Secondary | ICD-10-CM

## 2016-10-27 NOTE — Patient Instructions (Signed)
Please follow-up with Dr Denman George or a gynecologist for a check of your vulva in January, 2019 or sooner if you develop new itch or irritation.

## 2016-10-27 NOTE — Progress Notes (Signed)
Follow Up Note: Gyn-Onc  Ellen Ryan 61 y.o. female  CC:  Chief Complaint  Patient presents with  . 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  She is doing very well post-operatively.  She no longer has pain.  Last residual suture removed today.  No sign of infection or separation.   Will see her back in January, 2019 for follow-up.  She will require annual checks of the vulva for signs of recurrent VIN3, but paps will be unlikely to be of benefit due to vaginal agglutination.   HPI:   Ellen Ryan is 61 year old female initially seen at the request of [name] for extramammary pagets disease.  The patient is visiting her daughter now and lives in [place]. She reported a 2 year history of vulvar ulceration. She was seen by [name] for this problem in April, 2018 and was taken to the OR for an examination under anesthesia on 09/01/16. Extensive ulceration was present. 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    On 09/21/16, she underwent a complete simple vulvectomy.  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 No invasive carcinoma, negative margins. Post-operative course was uneventful and she was discharged with a foley catheter for one week post-op.  Interval History:  She presents today to the office with her daughter for scheduled follow-up. She is doing very well postop. Denies any pain. Is tearful and grateful at resolution of symptoms. She is voiding without difficulty. She has some vaginal bleeding and occasional urinary incontinence.  Review of Systems Constitutional: Feels well.  No fever, chills, early satiety, change in appetite.   Cardiovascular: No chest pain, shortness of breath, or edema.  Pulmonary: No cough or wheeze.  Gastrointestinal: No nausea, vomiting, or diarrhea. No bright red blood per rectum or change in bowel movement.   Genitourinary: No frequency, urgency, or dysuria. No vaginal bleeding or discharge.  Musculoskeletal: No myalgia or joint pain Neurologic: No weakness, numbness, or change in gait.  Psychology: No depression, anxiety, or insomnia  Current Meds:  Outpatient Encounter Prescriptions as of 10/27/2016  Medication Sig  . amLODipine (NORVASC) 10 MG tablet Take 1 tablet (10 mg total) by mouth daily.  Marland Kitchen ibuprofen (ADVIL,MOTRIN) 200 MG tablet Take 600 mg by mouth every 6 (six) hours as needed for mild pain.   Marland Kitchen lidocaine (XYLOCAINE) 2 % jelly Apply 1 application topically as needed. (Patient taking differently: Apply 1 application topically 3 (three) times daily as needed (for pain.). )  . meloxicam (MOBIC) 15 MG tablet Take 1 tablet (15 mg total) by mouth daily.  . Multiple Vitamin (MULTIVITAMIN WITH MINERALS) TABS tablet Take 1 tablet by mouth daily. Centrum  . Omega-3 Fatty Acids (FISH OIL PO) Take 1 capsule by mouth daily.  Marland Kitchen oxybutynin (DITROPAN-XL) 10 MG 24 hr tablet Take 1 tablet (10 mg total) by mouth at bedtime.  Marland Kitchen oxyCODONE-acetaminophen (PERCOCET/ROXICET) 5-325 MG tablet Take 1-2 tablets by mouth every 4 (four) hours as needed (moderate to severe pain).  Marland Kitchen senna-docusate (SENOKOT-S) 8.6-50 MG tablet Take 2 tablets by mouth at bedtime.   No facility-administered encounter medications on file as of 10/27/2016.     Allergy: No Known Allergies  Social Hx:   Social History   Social History  . Marital status: Widowed    Spouse name: N/A  . Number of children: N/A  . Years of education:  N/A   Occupational History  . Not on file.   Social History Main Topics  . Smoking status: Never Smoker  . Smokeless tobacco: Never Used  . Alcohol use Yes     Comment: OCCASIONALLY   . Drug use: No  . Sexual activity: Not on file   Other Topics Concern  . Not on file   Social History Narrative  . No narrative on file    Past Surgical Hx:  Past Surgical History:  Procedure Laterality Date   . COLPOSCOPY VULVA W/ BIOPSY    . RADICAL VULVECTOMY N/A 09/21/2016   Procedure: RADICAL VULVECTOMY COMPLETE;  Surgeon: Everitt Amber, MD;  Location: [facility];  Service: Gynecology;  Laterality: N/A;    Past Medical Hx:  Past Medical History:  Diagnosis Date  . Arthritis    both knees  . Hyperlipidemia   . Hypertension     Family Hx: History reviewed. No pertinent family history.  Vitals:  Blood pressure (!) 143/74, pulse (!) 110, temperature 98.1 F (36.7 C), resp. rate 20, weight 176 lb (79.8 kg), SpO2 98 %.  Physical Exam:  General: Well developed, well nourished female in no acute distress. Alert and oriented x 3.  Cardiovascular: Regular rate and rhythm. S1 and S2 normal.  Lungs: Clear to auscultation bilaterally. No wheezes/crackles/rhonchi noted.  Skin: No rashes or lesions present. Back: No CVA tenderness.  Abdomen: Abdomen soft, non-tender and obese. Active bowel sounds in all quadrants. No evidence of a fluid wave or abdominal masses.  Genitourinary:    Vulva/vagina: Vulvectomy incision with one prolene suture removed, no drainage present, no signs of infection, no separation Extremities: No bilateral cyanosis, edema, or clubbing.   Donaciano Eva, MD 10/27/2016, 2:18 PM

## 2016-11-24 MED FILL — AMLODIPINE BESYLATE 5 MG TA: 5 | 30 days supply | Qty: 30 | Fill #3

## 2016-12-27 ENCOUNTER — Encounter: Payer: Self-pay | Admitting: Internal Medicine

## 2016-12-27 ENCOUNTER — Ambulatory Visit: Payer: Self-pay | Attending: Internal Medicine | Admitting: Internal Medicine

## 2016-12-27 VITALS — BP 159/98 | HR 116 | Temp 98.5°F | Resp 16 | Wt 159.8 lb

## 2016-12-27 DIAGNOSIS — Z131 Encounter for screening for diabetes mellitus: Secondary | ICD-10-CM | POA: Insufficient documentation

## 2016-12-27 DIAGNOSIS — E119 Type 2 diabetes mellitus without complications: Secondary | ICD-10-CM

## 2016-12-27 DIAGNOSIS — R350 Frequency of micturition: Secondary | ICD-10-CM

## 2016-12-27 DIAGNOSIS — Z79899 Other long term (current) drug therapy: Secondary | ICD-10-CM | POA: Insufficient documentation

## 2016-12-27 DIAGNOSIS — R05 Cough: Secondary | ICD-10-CM | POA: Insufficient documentation

## 2016-12-27 DIAGNOSIS — M13862 Other specified arthritis, left knee: Secondary | ICD-10-CM | POA: Insufficient documentation

## 2016-12-27 DIAGNOSIS — M13861 Other specified arthritis, right knee: Secondary | ICD-10-CM | POA: Insufficient documentation

## 2016-12-27 DIAGNOSIS — R059 Cough, unspecified: Secondary | ICD-10-CM

## 2016-12-27 DIAGNOSIS — R300 Dysuria: Secondary | ICD-10-CM | POA: Insufficient documentation

## 2016-12-27 DIAGNOSIS — Z7984 Long term (current) use of oral hypoglycemic drugs: Secondary | ICD-10-CM | POA: Insufficient documentation

## 2016-12-27 DIAGNOSIS — I10 Essential (primary) hypertension: Secondary | ICD-10-CM

## 2016-12-27 DIAGNOSIS — E785 Hyperlipidemia, unspecified: Secondary | ICD-10-CM | POA: Insufficient documentation

## 2016-12-27 HISTORY — DX: Type 2 diabetes mellitus without complications: E11.9

## 2016-12-27 LAB — POCT URINALYSIS DIPSTICK
Bilirubin, UA: NEGATIVE
Glucose, UA: 500
Ketones, UA: 80
LEUKOCYTES UA: NEGATIVE
NITRITE UA: NEGATIVE
PH UA: 5 (ref 5.0–8.0)
PROTEIN UA: NEGATIVE
Spec Grav, UA: <=1.005 — AB (ref 1.010–1.025)
Urobilinogen, UA: 0.2 E.U./dL

## 2016-12-27 LAB — POCT GLYCOSYLATED HEMOGLOBIN (HGB A1C): Hemoglobin A1C: 12.8

## 2016-12-27 LAB — GLUCOSE, POCT (MANUAL RESULT ENTRY)
POC GLUCOSE: 545 mg/dL — AB (ref 70–99)
POC GLUCOSE: 405 mg/dL — AB (ref 70–99)
POC Glucose: 413 mg/dl — AB (ref 70–99)

## 2016-12-27 MED ORDER — CEPHALEXIN 500 MG PO CAPS: 500.0000 mg | ORAL_CAPSULE | Freq: Two times a day (BID) | ORAL | 0 refills | Status: DC

## 2016-12-27 MED ORDER — TRUE METRIX METER W/DEVICE KIT: PACK | 0 refills | Status: DC

## 2016-12-27 MED ORDER — TRUEPLUS LANCETS 28G MISC: 12 refills | Status: DC

## 2016-12-27 MED ORDER — METFORMIN HCL 1000 MG PO TABS: 1000.0000 mg | ORAL_TABLET | Freq: Two times a day (BID) | ORAL | 3 refills | Status: DC

## 2016-12-27 MED ORDER — GLUCOSE BLOOD VI STRP: ORAL_STRIP | 12 refills | Status: DC

## 2016-12-27 MED ORDER — SOLIFENACIN SUCCINATE 10 MG PO TABS: 10.0000 mg | ORAL_TABLET | Freq: Every day | ORAL | 3 refills | Status: DC

## 2016-12-27 MED ORDER — AMLODIPINE BESYLATE 10 MG PO TABS: 10.0000 mg | ORAL_TABLET | Freq: Every day | ORAL | 3 refills | Status: DC

## 2016-12-27 MED FILL — CEPHALEXIN 500 MG CAPSULE: 500 | 10 days supply | Qty: 20 | Fill #0

## 2016-12-27 MED FILL — !TRUE METRIX BLOOD GLUCOSE: 1 days supply | Qty: 1 | Fill #0

## 2016-12-27 MED FILL — TRUE METRIX TEST STRIP: 30 days supply | Qty: 100 | Fill #0

## 2016-12-27 MED FILL — AMLODIPINE BESYLATE 10 MG T: 10 | 30 days supply | Qty: 30 | Fill #0

## 2016-12-27 MED FILL — TRUEplus LANCETS 28G MISC: 30 days supply | Qty: 100 | Fill #0

## 2016-12-27 MED FILL — ?METFORMIN HCL 1,000 MG TAB: 1000 | 30 days supply | Qty: 60 | Fill #0

## 2016-12-27 NOTE — Patient Instructions (Addendum)
DASH Eating Plan DASH stands for "Dietary Approaches to Stop Hypertension." The DASH eating plan is a healthy eating plan that has been shown to reduce high blood pressure (hypertension). It may also reduce your risk for type 2 diabetes, heart disease, and stroke. The DASH eating plan may also help with weight loss. What are tips for following this plan? General guidelines  Avoid eating more than 2,300 mg (milligrams) of salt (sodium) a day. If you have hypertension, you may need to reduce your sodium intake to 1,500 mg a day.  Limit alcohol intake to no more than 1 drink a day for nonpregnant women and 2 drinks a day for men. One drink equals 12 oz of beer, 5 oz of wine, or 1 oz of hard liquor.  Work with your health care provider to maintain a healthy body weight or to lose weight. Ask what an ideal weight is for you.  Get at least 30 minutes of exercise that causes your heart to beat faster (aerobic exercise) most days of the week. Activities may include walking, swimming, or biking.  Work with your health care provider or diet and nutrition specialist (dietitian) to adjust your eating plan to your individual calorie needs. Reading food labels  Check food labels for the amount of sodium per serving. Choose foods with less than 5 percent of the Daily Value of sodium. Generally, foods with less than 300 mg of sodium per serving fit into this eating plan.  To find whole grains, look for the word "whole" as the first word in the ingredient list. Shopping  Buy products labeled as "low-sodium" or "no salt added."  Buy fresh foods. Avoid canned foods and premade or frozen meals. Cooking  Avoid adding salt when cooking. Use salt-free seasonings or herbs instead of table salt or sea salt. Check with your health care provider or pharmacist before using salt substitutes.  Do not fry foods. Cook foods using healthy methods such as baking, boiling, grilling, and broiling instead.  Cook with  heart-healthy oils, such as olive, canola, soybean, or sunflower oil. Meal planning   Eat a balanced diet that includes: ? 5 or more servings of fruits and vegetables each day. At each meal, try to fill half of your plate with fruits and vegetables. ? Up to 6-8 servings of whole grains each day. ? Less than 6 oz of lean meat, poultry, or fish each day. A 3-oz serving of meat is about the same size as a deck of cards. One egg equals 1 oz. ? 2 servings of low-fat dairy each day. ? A serving of nuts, seeds, or beans 5 times each week. ? Heart-healthy fats. Healthy fats called Omega-3 fatty acids are found in foods such as flaxseeds and coldwater fish, like sardines, salmon, and mackerel.  Limit how much you eat of the following: ? Canned or prepackaged foods. ? Food that is high in trans fat, such as fried foods. ? Food that is high in saturated fat, such as fatty meat. ? Sweets, desserts, sugary drinks, and other foods with added sugar. ? Full-fat dairy products.  Do not salt foods before eating.  Try to eat at least 2 vegetarian meals each week.  Eat more home-cooked food and less restaurant, buffet, and fast food.  When eating at a restaurant, ask that your food be prepared with less salt or no salt, if possible. What foods are recommended? The items listed may not be a complete list. Talk with your dietitian about what   dietary choices are best for you. Grains Whole-grain or whole-wheat bread. Whole-grain or whole-wheat pasta. Brown rice. Oatmeal. Quinoa. Bulgur. Whole-grain and low-sodium cereals. Pita bread. Low-fat, low-sodium crackers. Whole-wheat flour tortillas. Vegetables Fresh or frozen vegetables (raw, steamed, roasted, or grilled). Low-sodium or reduced-sodium tomato and vegetable juice. Low-sodium or reduced-sodium tomato sauce and tomato paste. Low-sodium or reduced-sodium canned vegetables. Fruits All fresh, dried, or frozen fruit. Canned fruit in natural juice (without  added sugar). Meat and other protein foods Skinless chicken or turkey. Ground chicken or turkey. Pork with fat trimmed off. Fish and seafood. Egg whites. Dried beans, peas, or lentils. Unsalted nuts, nut butters, and seeds. Unsalted canned beans. Lean cuts of beef with fat trimmed off. Low-sodium, lean deli meat. Dairy Low-fat (1%) or fat-free (skim) milk. Fat-free, low-fat, or reduced-fat cheeses. Nonfat, low-sodium ricotta or cottage cheese. Low-fat or nonfat yogurt. Low-fat, low-sodium cheese. Fats and oils Soft margarine without trans fats. Vegetable oil. Low-fat, reduced-fat, or light mayonnaise and salad dressings (reduced-sodium). Canola, safflower, olive, soybean, and sunflower oils. Avocado. Seasoning and other foods Herbs. Spices. Seasoning mixes without salt. Unsalted popcorn and pretzels. Fat-free sweets. What foods are not recommended? The items listed may not be a complete list. Talk with your dietitian about what dietary choices are best for you. Grains Baked goods made with fat, such as croissants, muffins, or some breads. Dry pasta or rice meal packs. Vegetables Creamed or fried vegetables. Vegetables in a cheese sauce. Regular canned vegetables (not low-sodium or reduced-sodium). Regular canned tomato sauce and paste (not low-sodium or reduced-sodium). Regular tomato and vegetable juice (not low-sodium or reduced-sodium). Pickles. Olives. Fruits Canned fruit in a light or heavy syrup. Fried fruit. Fruit in cream or butter sauce. Meat and other protein foods Fatty cuts of meat. Ribs. Fried meat. Bacon. Sausage. Bologna and other processed lunch meats. Salami. Fatback. Hotdogs. Bratwurst. Salted nuts and seeds. Canned beans with added salt. Canned or smoked fish. Whole eggs or egg yolks. Chicken or turkey with skin. Dairy Whole or 2% milk, cream, and half-and-half. Whole or full-fat cream cheese. Whole-fat or sweetened yogurt. Full-fat cheese. Nondairy creamers. Whipped toppings.  Processed cheese and cheese spreads. Fats and oils Butter. Stick margarine. Lard. Shortening. Ghee. Bacon fat. Tropical oils, such as coconut, palm kernel, or palm oil. Seasoning and other foods Salted popcorn and pretzels. Onion salt, garlic salt, seasoned salt, table salt, and sea salt. Worcestershire sauce. Tartar sauce. Barbecue sauce. Teriyaki sauce. Soy sauce, including reduced-sodium. Steak sauce. Canned and packaged gravies. Fish sauce. Oyster sauce. Cocktail sauce. Horseradish that you find on the shelf. Ketchup. Mustard. Meat flavorings and tenderizers. Bouillon cubes. Hot sauce and Tabasco sauce. Premade or packaged marinades. Premade or packaged taco seasonings. Relishes. Regular salad dressings. Where to find more information:  National Heart, Lung, and Blood Institute: www.nhlbi.nih.gov  American Heart Association: www.heart.org Summary  The DASH eating plan is a healthy eating plan that has been shown to reduce high blood pressure (hypertension). It may also reduce your risk for type 2 diabetes, heart disease, and stroke.  With the DASH eating plan, you should limit salt (sodium) intake to 2,300 mg a day. If you have hypertension, you may need to reduce your sodium intake to 1,500 mg a day.  When on the DASH eating plan, aim to eat more fresh fruits and vegetables, whole grains, lean proteins, low-fat dairy, and heart-healthy fats.  Work with your health care provider or diet and nutrition specialist (dietitian) to adjust your eating plan to your individual   calorie needs. This information is not intended to replace advice given to you by your health care provider. Make sure you discuss any questions you have with your health care provider. Document Released: 03/30/2011 Document Revised: 04/03/2016 Document Reviewed: 04/03/2016 Elsevier Interactive Patient Education  2017 [employer] Hypertension Hypertension, commonly called high blood pressure, is when the force of blood  pumping through the arteries is too strong. The arteries are the blood vessels that carry blood from the heart throughout the body. Hypertension forces the heart to work harder to pump blood and may cause arteries to become narrow or stiff. Having untreated or uncontrolled hypertension can cause heart attacks, strokes, kidney disease, and other problems. A blood pressure reading consists of a higher number over a lower number. Ideally, your blood pressure should be below 120/80. The first ("top") number is called the systolic pressure. It is a measure of the pressure in your arteries as your heart beats. The second ("bottom") number is called the diastolic pressure. It is a measure of the pressure in your arteries as the heart relaxes. What are the causes? The cause of this condition is not known. What increases the risk? Some risk factors for high blood pressure are under your control. Others are not. Factors you can change  Smoking.  Having type 2 diabetes mellitus, high cholesterol, or both.  Not getting enough exercise or physical activity.  Being overweight.  Having too much fat, sugar, calories, or salt (sodium) in your diet.  Drinking too much alcohol. Factors that are difficult or impossible to change  Having chronic kidney disease.  Having a family history of high blood pressure.  Age. Risk increases with age.  Race. You may be at higher risk if you are African-American.  Gender. Men are at higher risk than women before age 45. After age 65, women are at higher risk than men.  Having obstructive sleep apnea.  Stress. What are the signs or symptoms? Extremely high blood pressure (hypertensive crisis) may cause:  Headache.  Anxiety.  Shortness of breath.  Nosebleed.  Nausea and vomiting.  Severe chest pain.  Jerky movements you cannot control (seizures).  How is this diagnosed? This condition is diagnosed by measuring your blood pressure while you are  seated, with your arm resting on a surface. The cuff of the blood pressure monitor will be placed directly against the skin of your upper arm at the level of your heart. It should be measured at least twice using the same arm. Certain conditions can cause a difference in blood pressure between your right and left arms. Certain factors can cause blood pressure readings to be lower or higher than normal (elevated) for a short period of time:  When your blood pressure is higher when you are in a health care provider's office than when you are at home, this is called white coat hypertension. Most people with this condition do not need medicines.  When your blood pressure is higher at home than when you are in a health care provider's office, this is called masked hypertension. Most people with this condition may need medicines to control blood pressure.  If you have a high blood pressure reading during one visit or you have normal blood pressure with other risk factors:  You may be asked to return on a different day to have your blood pressure checked again.  You may be asked to monitor your blood pressure at home for 1 week or longer.  If you are diagnosed   with hypertension, you may have other blood or imaging tests to help your health care provider understand your overall risk for other conditions. How is this treated? This condition is treated by making healthy lifestyle changes, such as eating healthy foods, exercising more, and reducing your alcohol intake. Your health care provider may prescribe medicine if lifestyle changes are not enough to get your blood pressure under control, and if:  Your systolic blood pressure is above 130.  Your diastolic blood pressure is above 80.  Your personal target blood pressure may vary depending on your medical conditions, your age, and other factors. Follow these instructions at home: Eating and drinking  Eat a diet that is high in fiber and potassium,  and low in sodium, added sugar, and fat. An example eating plan is called the DASH (Dietary Approaches to Stop Hypertension) diet. To eat this way: ? Eat plenty of fresh fruits and vegetables. Try to fill half of your plate at each meal with fruits and vegetables. ? Eat whole grains, such as whole wheat pasta, brown rice, or whole grain bread. Fill about one quarter of your plate with whole grains. ? Eat or drink low-fat dairy products, such as skim milk or low-fat yogurt. ? Avoid fatty cuts of meat, processed or cured meats, and poultry with skin. Fill about one quarter of your plate with lean proteins, such as fish, chicken without skin, beans, eggs, and tofu. ? Avoid premade and processed foods. These tend to be higher in sodium, added sugar, and fat.  Reduce your daily sodium intake. Most people with hypertension should eat less than 1,500 mg of sodium a day.  Limit alcohol intake to no more than 1 drink a day for nonpregnant women and 2 drinks a day for men. One drink equals 12 oz of beer, 5 oz of wine, or 1 oz of hard liquor. Lifestyle  Work with your health care provider to maintain a healthy body weight or to lose weight. Ask what an ideal weight is for you.  Get at least 30 minutes of exercise that causes your heart to beat faster (aerobic exercise) most days of the week. Activities may include walking, swimming, or biking.  Include exercise to strengthen your muscles (resistance exercise), such as pilates or lifting weights, as part of your weekly exercise routine. Try to do these types of exercises for 30 minutes at least 3 days a week.  Do not use any products that contain nicotine or tobacco, such as cigarettes and e-cigarettes. If you need help quitting, ask your health care provider.  Monitor your blood pressure at home as told by your health care provider.  Keep all follow-up visits as told by your health care provider. This is important. Medicines  Take over-the-counter and  prescription medicines only as told by your health care provider. Follow directions carefully. Blood pressure medicines must be taken as prescribed.  Do not skip doses of blood pressure medicine. Doing this puts you at risk for problems and can make the medicine less effective.  Ask your health care provider about side effects or reactions to medicines that you should watch for. Contact a health care provider if:  You think you are having a reaction to a medicine you are taking.  You have headaches that keep coming back (recurring).  You feel dizzy.  You have swelling in your ankles.  You have trouble with your vision. Get help right away if:  You develop a severe headache or confusion.  You   have unusual weakness or numbness.  You feel faint.  You have severe pain in your chest or abdomen.  You vomit repeatedly.  You have trouble breathing. Summary  Hypertension is when the force of blood pumping through your arteries is too strong. If this condition is not controlled, it may put you at risk for serious complications.  Your personal target blood pressure may vary depending on your medical conditions, your age, and other factors. For most people, a normal blood pressure is less than 120/80.  Hypertension is treated with lifestyle changes, medicines, or a combination of both. Lifestyle changes include weight loss, eating a healthy, low-sodium diet, exercising more, and limiting alcohol. This information is not intended to replace advice given to you by your health care provider. Make sure you discuss any questions you have with your health care provider. Document Released: 04/10/2005 Document Revised: 03/08/2016 Document Reviewed: 03/08/2016 Elsevier Interactive Patient Education  2018 [place]. Cough, Adult Coughing is a reflex that clears your throat and your airways. Coughing helps to heal and protect your lungs. It is normal to cough occasionally, but a cough that  happens with other symptoms or lasts a long time may be a sign of a condition that needs treatment. A cough may last only 2-3 weeks (acute), or it may last longer than 8 weeks (chronic). What are the causes? Coughing is commonly caused by:  Breathing in substances that irritate your lungs.  A viral or bacterial respiratory infection.  Allergies.  Asthma.  Postnasal drip.  Smoking.  Acid backing up from the stomach into the esophagus (gastroesophageal reflux).  Certain medicines.  Chronic lung problems, including COPD (or rarely, lung cancer).  Other medical conditions such as heart failure.  Follow these instructions at home: Pay attention to any changes in your symptoms. Take these actions to help with your discomfort:  Take medicines only as told by your health care provider. ? If you were prescribed an antibiotic medicine, take it as told by your health care provider. Do not stop taking the antibiotic even if you start to feel better. ? Talk with your health care provider before you take a cough suppressant medicine.  Drink enough fluid to keep your urine clear or pale yellow.  If the air is dry, use a cold steam vaporizer or humidifier in your bedroom or your home to help loosen secretions.  Avoid anything that causes you to cough at work or at home.  If your cough is worse at night, try sleeping in a semi-upright position.  Avoid cigarette smoke. If you smoke, quit smoking. If you need help quitting, ask your health care provider.  Avoid caffeine.  Avoid alcohol.  Rest as needed.  Contact a health care provider if:  You have new symptoms.  You cough up pus.  Your cough does not get better after 2-3 weeks, or your cough gets worse.  You cannot control your cough with suppressant medicines and you are losing sleep.  You develop pain that is getting worse or pain that is not controlled with pain medicines.  You have a fever.  You have unexplained weight  loss.  You have night sweats. Get help right away if:  You cough up blood.  You have difficulty breathing.  Your heartbeat is very fast. This information is not intended to replace advice given to you by your health care provider. Make sure you discuss any questions you have with your health care provider. Document Released: 10/07/2010 Document Revised: 09/16/2015  Document Reviewed: 06/17/2014 Elsevier Interactive Patient Education  2017 [place].  Diabetes Mellitus and Exercise Exercising regularly is important for your overall health, especially when you have diabetes (diabetes mellitus). Exercising is not only about losing weight. It has many health benefits, such as increasing muscle strength and bone density and reducing body fat and stress. This leads to improved fitness, flexibility, and endurance, all of which result in better overall health. Exercise has additional benefits for people with diabetes, including:  Reducing appetite.  Helping to lower and control blood glucose.  Lowering blood pressure.  Helping to control amounts of fatty substances (lipids) in the blood, such as cholesterol and triglycerides.  Helping the body to respond better to insulin (improving insulin sensitivity).  Reducing how much insulin the body needs.  Decreasing the risk for heart disease by: ? Lowering cholesterol and triglyceride levels. ? Increasing the levels of good cholesterol. ? Lowering blood glucose levels.  What is my activity plan? Your health care provider or certified diabetes educator can help you make a plan for the type and frequency of exercise (activity plan) that works for you. Make sure that you:  Do at least 150 minutes of moderate-intensity or vigorous-intensity exercise each week. This could be brisk walking, biking, or water aerobics. ? Do stretching and strength exercises, such as yoga or weightlifting, at least 2 times a week. ? Spread out your activity over  at least 3 days of the week.  Get some form of physical activity every day. ? Do not go more than 2 days in a row without some kind of physical activity. ? Avoid being inactive for more than 90 minutes at a time. Take frequent breaks to walk or stretch.  Choose a type of exercise or activity that you enjoy, and set realistic goals.  Start slowly, and gradually increase the intensity of your exercise over time.  What do I need to know about managing my diabetes?  Check your blood glucose before and after exercising. ? If your blood glucose is higher than 240 mg/dL (13.3 mmol/L) before you exercise, check your urine for ketones. If you have ketones in your urine, do not exercise until your blood glucose returns to normal.  Know the symptoms of low blood glucose (hypoglycemia) and how to treat it. Your risk for hypoglycemia increases during and after exercise. Common symptoms of hypoglycemia can include: ? Hunger. ? Anxiety. ? Sweating and feeling clammy. ? Confusion. ? Dizziness or feeling light-headed. ? Increased heart rate or palpitations. ? Blurry vision. ? Tingling or numbness around the mouth, lips, or tongue. ? Tremors or shakes. ? Irritability.  Keep a rapid-acting carbohydrate snack available before, during, and after exercise to help prevent or treat hypoglycemia.  Avoid injecting insulin into areas of the body that are going to be exercised. For example, avoid injecting insulin into: ? The arms, when playing tennis. ? The legs, when jogging.  Keep records of your exercise habits. Doing this can help you and your health care provider adjust your diabetes management plan as needed. Write down: ? Food that you eat before and after you exercise. ? Blood glucose levels before and after you exercise. ? The type and amount of exercise you have done. ? When your insulin is expected to peak, if you use insulin. Avoid exercising at times when your insulin is peaking.  When you  start a new exercise or activity, work with your health care provider to make sure the activity is safe for  you, and to adjust your insulin, medicines, or food intake as needed.  Drink plenty of water while you exercise to prevent dehydration or heat stroke. Drink enough fluid to keep your urine clear or pale yellow. This information is not intended to replace advice given to you by your health care provider. Make sure you discuss any questions you have with your health care provider. Document Released: 07/01/2003 Document Revised: 10/29/2015 Document Reviewed: 09/20/2015 Elsevier Interactive Patient Education  2018 [employer]. Diabetes Mellitus and Food It is important for you to manage your blood sugar (glucose) level. Your blood glucose level can be greatly affected by what you eat. Eating healthier foods in the appropriate amounts throughout the day at about the same time each day will help you control your blood glucose level. It can also help slow or prevent worsening of your diabetes mellitus. Healthy eating may even help you improve the level of your blood pressure and reach or maintain a healthy weight. General recommendations for healthful eating and cooking habits include:  Eating meals and snacks regularly. Avoid going long periods of time without eating to lose weight.  Eating a diet that consists mainly of plant-based foods, such as fruits, vegetables, nuts, legumes, and whole grains.  Using low-heat cooking methods, such as baking, instead of high-heat cooking methods, such as deep frying.  Work with your dietitian to make sure you understand how to use the Nutrition Facts information on food labels. How can food affect me? Carbohydrates Carbohydrates affect your blood glucose level more than any other type of food. Your dietitian will help you determine how many carbohydrates to eat at each meal and teach you how to count carbohydrates. Counting carbohydrates is important to  keep your blood glucose at a healthy level, especially if you are using insulin or taking certain medicines for diabetes mellitus. Alcohol Alcohol can cause sudden decreases in blood glucose (hypoglycemia), especially if you use insulin or take certain medicines for diabetes mellitus. Hypoglycemia can be a life-threatening condition. Symptoms of hypoglycemia (sleepiness, dizziness, and disorientation) are similar to symptoms of having too much alcohol. If your health care provider has given you approval to drink alcohol, do so in moderation and use the following guidelines:  Women should not have more than one drink per day, and men should not have more than two drinks per day. One drink is equal to: ? 12 oz of beer. ? 5 oz of wine. ? 1 oz of hard liquor.  Do not drink on an empty stomach.  Keep yourself hydrated. Have water, diet soda, or unsweetened iced tea.  Regular soda, juice, and other mixers might contain a lot of carbohydrates and should be counted.  What foods are not recommended? As you make food choices, it is important to remember that all foods are not the same. Some foods have fewer nutrients per serving than other foods, even though they might have the same number of calories or carbohydrates. It is difficult to get your body what it needs when you eat foods with fewer nutrients. Examples of foods that you should avoid that are high in calories and carbohydrates but low in nutrients include:  Trans fats (most processed foods list trans fats on the Nutrition Facts label).  Regular soda.  Juice.  Candy.  Sweets, such as cake, pie, doughnuts, and cookies.  Fried foods.  What foods can I eat? Eat nutrient-rich foods, which will nourish your body and keep you healthy. The food you should eat also  will depend on several factors, including:  The calories you need.  The medicines you take.  Your weight.  Your blood glucose level.  Your blood pressure level.  Your  cholesterol level.  You should eat a variety of foods, including:  Protein. ? Lean cuts of meat. ? Proteins low in saturated fats, such as fish, egg whites, and beans. Avoid processed meats.  Fruits and vegetables. ? Fruits and vegetables that may help control blood glucose levels, such as apples, mangoes, and yams.  Dairy products. ? Choose fat-free or low-fat dairy products, such as milk, yogurt, and cheese.  Grains, bread, pasta, and rice. ? Choose whole grain products, such as multigrain bread, whole oats, and brown rice. These foods may help control blood pressure.  Fats. ? Foods containing healthful fats, such as nuts, avocado, olive oil, canola oil, and fish.  Does everyone with diabetes mellitus have the same meal plan? Because every person with diabetes mellitus is different, there is not one meal plan that works for everyone. It is very important that you meet with a dietitian who will help you create a meal plan that is just right for you. This information is not intended to replace advice given to you by your health care provider. Make sure you discuss any questions you have with your health care provider. Document Released: 01/05/2005 Document Revised: 09/16/2015 Document Reviewed: 03/07/2013 Elsevier Interactive Patient Education  2017 [employer].  Blood Glucose Monitoring, Adult Monitoring your blood sugar (glucose) helps you manage your diabetes. It also helps you and your health care provider determine how well your diabetes management plan is working. Blood glucose monitoring involves checking your blood glucose as often as directed, and keeping a record (log) of your results over time. Why should I monitor my blood glucose? Checking your blood glucose regularly can:  Help you understand how food, exercise, illnesses, and medicines affect your blood glucose.  Let you know what your blood glucose is at any time. You can quickly tell if you are having low blood  glucose (hypoglycemia) or high blood glucose (hyperglycemia).  Help you and your health care provider adjust your medicines as needed.  When should I check my blood glucose? Follow instructions from your health care provider about how often to check your blood glucose. This may depend on:  The type of diabetes you have.  How well-controlled your diabetes is.  Medicines you are taking.  If you have type 1 diabetes:  Check your blood glucose at least 2 times a day.  Also check your blood glucose: ? Before every insulin injection. ? Before and after exercise. ? Between meals. ? 2 hours after a meal. ? Occasionally between 2:00 a.m. and 3:00 a.m., as directed. ? Before potentially dangerous tasks, like driving or using heavy machinery. ? At bedtime.  You may need to check your blood glucose more often, up to 6-10 times a day: ? If you use an insulin pump. ? If you need multiple daily injections (MDI). ? If your diabetes is not well-controlled. ? If you are ill. ? If you have a history of severe hypoglycemia. ? If you have a history of not knowing when your blood glucose is getting low (hypoglycemia unawareness). If you have type 2 diabetes:  If you take insulin or other diabetes medicines, check your blood glucose at least 2 times a day.  If you are on intensive insulin therapy, check your blood glucose at least 4 times a day. Occasionally, you may  also need to check between 2:00 a.m. and 3:00 a.m., as directed.  Also check your blood glucose: ? Before and after exercise. ? Before potentially dangerous tasks, like driving or using heavy machinery.  You may need to check your blood glucose more often if: ? Your medicine is being adjusted. ? Your diabetes is not well-controlled. ? You are ill. What is a blood glucose log?  A blood glucose log is a record of your blood glucose readings. It helps you and your health care provider: ? Look for patterns in your blood glucose  over time. ? Adjust your diabetes management plan as needed.  Every time you check your blood glucose, write down your result and notes about things that may be affecting your blood glucose, such as your diet and exercise for the day.  Most glucose meters store a record of glucose readings in the meter. Some meters allow you to download your records to a computer. How do I check my blood glucose? Follow these steps to get accurate readings of your blood glucose: Supplies needed   Blood glucose meter.  Test strips for your meter. Each meter has its own strips. You must use the strips that come with your meter.  A needle to prick your finger (lancet). Do not use lancets more than once.  A device that holds the lancet (lancing device).  A journal or log book to write down your results. Procedure  Wash your hands with soap and water.  Prick the side of your finger (not the tip) with the lancet. Use a different finger each time.  Gently rub the finger until a small drop of blood appears.  Follow instructions that come with your meter for inserting the test strip, applying blood to the strip, and using your blood glucose meter.  Write down your result and any notes. Alternative testing sites  Some meters allow you to use areas of your body other than your finger (alternative sites) to test your blood.  If you think you may have hypoglycemia, or if you have hypoglycemia unawareness, do not use alternative sites. Use your finger instead.  Alternative sites may not be as accurate as the fingers, because blood flow is slower in these areas. This means that the result you get may be delayed, and it may be different from the result that you would get from your finger.  The most common alternative sites are: ? Forearm. ? Thigh. ? Palm of the hand. Additional tips  Always keep your supplies with you.  If you have questions or need help, all blood glucose meters have a 24-hour  "hotline" number that you can call. You may also contact your health care provider.  After you use a few boxes of test strips, adjust (calibrate) your blood glucose meter by following instructions that came with your meter. This information is not intended to replace advice given to you by your health care provider. Make sure you discuss any questions you have with your health care provider. Document Released: 04/13/2003 Document Revised: 10/29/2015 Document Reviewed: 09/20/2015 Elsevier Interactive Patient Education  2017 [employer].

## 2016-12-27 NOTE — Progress Notes (Signed)
Ellen Ryan, is a 61 y.o. female  [id number]  [id number]  DOB - Jun 21, 1955  Chief Complaint  Patient presents with  . Urinary Frequency      Subjective:   Ellen Ryan is a 61 y.o. 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 Since her surgery, patient initially felt a lot better, pain in the vaginal area resolved and after removal of urethral catheter she was voiding easily without spillage without ulceration or pain. But for the past 4 weeks, patient has been urinating very frequently and large quantity at a time. Although this is not associated with dysuria, she is disturbed because she urinate several times at night, almost every 30 minutes, so she does not have good sleep. She is not known to have diabetes. She is only on amlodipine for blood pressure. She is visiting her daughter who is a Designer, jewellery, desires to go back to [place] soon. She denies any chest pain, denies any headache but noticed occasional blurry vision, no double vision. She denies any fever. She denies any change in bowel habit. She denies any weight loss. She is not sure of polyphagia but she knows she drinks plenty of water because she urinates too frequently. No abdominal pain - No Nausea, No new weakness tingling or numbness. She also coughs at night but no shortness of breath. Cough is dry, nonproductive.  Problem  Cough  Diabetes Mellitus, New Onset ([facility])    ALLERGIES: No Known Allergies  PAST MEDICAL HISTORY: Past Medical History:  Diagnosis Date  . Arthritis    both knees  . Diabetes mellitus, new onset ([place]) 12/27/2016  . Hyperlipidemia   . Hypertension     MEDICATIONS AT HOME: Prior to Admission medications   Medication Sig Start Date End Date Taking? Authorizing Provider  amLODipine (NORVASC) 10 MG tablet Take 1 tablet (10 mg total) by mouth daily. 12/27/16   Tresa Garter, MD    cephALEXin (KEFLEX) 500 MG capsule Take 1 capsule (500 mg total) by mouth 2 (two) times daily. 12/27/16   Tresa Garter, MD  metFORMIN (GLUCOPHAGE) 1000 MG tablet Take 1 tablet (1,000 mg total) by mouth 2 (two) times daily with a meal. 12/27/16   Tresa Garter, MD  Multiple Vitamin (MULTIVITAMIN WITH MINERALS) TABS tablet Take 1 tablet by mouth daily. Centrum    [provider]  Omega-3 Fatty Acids (FISH OIL PO) Take 1 capsule by mouth daily.    [provider]  senna-docusate (SENOKOT-S) 8.6-50 MG tablet Take 2 tablets by mouth at bedtime. 09/22/16   Cross, Lenna Sciara D, NP  solifenacin (VESICARE) 10 MG tablet Take 1 tablet (10 mg total) by mouth daily. 12/27/16   Tresa Garter, MD    Objective:   Vitals:   12/27/16 1156  BP: (!) 159/98  Pulse: (!) 116  Resp: 16  Temp: 98.5 F (36.9 C)  TempSrc: Oral  SpO2: 95%  Weight: 159 lb 12.8 oz (72.5 kg)   Exam General appearance : Awake, alert, not in any distress. Speech Clear. Not toxic looking HEENT: Atraumatic and Normocephalic, pupils equally reactive to light and accomodation Neck: Supple, no JVD. No cervical lymphadenopathy.  Chest: Good air entry bilaterally, no added sounds  CVS: S1 S2 regular, no murmurs.  Abdomen: Bowel sounds present, Non tender and not distended with no gaurding, rigidity or rebound. Extremities: B/L Lower Ext shows no edema, both legs are warm to  touch Neurology: Awake alert, and oriented X 3, CN II-XII intact, Non focal Skin: No Rash  Data Review Lab Results  Component Value Date   HGBA1C 12.8 12/27/2016   HGBA1C 5.7 04/06/2016    Assessment & Plan   1. Urinary frequency  - POCT urinalysis dipstick - Urine Culture - cephALEXin (KEFLEX) 500 MG capsule; Take 1 capsule (500 mg total) by mouth 2 (two) times daily.  Dispense: 20 capsule; Refill: 0 - solifenacin (VESICARE) 10 MG tablet; Take 1 tablet (10 mg total) by mouth daily.  Dispense: 30 tablet; Refill: 3  2.  Diabetes mellitus, new onset ([place])  - POCT glycosylated hemoglobin (Hb A1C)  - metFORMIN (GLUCOPHAGE) 1000 MG tablet; Take 1 tablet (1,000 mg total) by mouth 2 (two) times daily with a meal.  Dispense: 180 tablet; Refill: 3 - POCT glucose (manual entry) - POCT glucose (manual entry) - CBC with Differential/Platelet - CMP14+EGFR - Lipid panel - TSH  Aim for 30 minutes of exercise most days. Rethink what you drink. Water is great! Aim for 2-3 Carb Choices per meal (30-45 grams) +/- 1 either way  Aim for 0-15 Carbs per snack if hungry  Include protein in moderation with your meals and snacks  Consider reading food labels for Total Carbohydrate and Fat Grams of foods  Consider checking BG at alternate times per day  Continue taking medication as directed Be mindful about how much sugar you are adding to beverages and other foods. Fruit Punch - find one with no sugar  Measure and decrease portions of carbohydrate foods  Make your plate and don't go back for seconds   3. Essential hypertension  - amLODipine (NORVASC) 10 MG tablet; Take 1 tablet (10 mg total) by mouth daily.  Dispense: 90 tablet; Refill: 3  We have discussed target BP range and blood pressure goal. I have advised patient to check BP regularly and to call us back or report to clinic if the numbers are consistently higher than 140/90. We discussed the importance of compliance with medical therapy and DASH diet recommended, consequences of uncontrolled hypertension discussed.  - continue current BP medications  4. Cough  - DG Chest 2 View; Future - ECHOCARDIOGRAM COMPLETE; Future  Patient have been counseled extensively about nutrition and exercise. Other issues discussed during this visit include: low cholesterol diet, weight control and daily exercise, foot care, annual eye examinations at Ophthalmology, importance of adherence with medications and regular follow-up. We also discussed long term complications of  uncontrolled diabetes and hypertension.   Return in about 4 weeks (around 01/24/2017) for Follow up HTN, UTI.  The patient was given clear instructions to go to ER or return to medical center if symptoms don't improve, worsen or new problems develop. The patient verbalized understanding. The patient was told to call to get lab results if they haven't heard anything in the next week.   This note has been created with Surveyor, quantity. Any transcriptional errors are unintentional.    Angelica Chessman, MD, [place], [place], [place], [place] and [place], [place]   12/27/2016, 4:15 PM

## 2016-12-28 ENCOUNTER — Ambulatory Visit (HOSPITAL_COMMUNITY)
Admission: RE | Admit: 2016-12-28 | Discharge: 2016-12-28 | Disposition: A | Discharge status: Home / Self Care | Payer: Self-pay | Source: Ambulatory Visit | Attending: Internal Medicine | Admitting: Internal Medicine

## 2016-12-28 ENCOUNTER — Other Ambulatory Visit: Payer: Self-pay | Admitting: Pharmacist

## 2016-12-28 DIAGNOSIS — R059 Cough, unspecified: Secondary | ICD-10-CM

## 2016-12-28 DIAGNOSIS — R05 Cough: Secondary | ICD-10-CM | POA: Insufficient documentation

## 2016-12-28 LAB — CBC WITH DIFFERENTIAL/PLATELET
BASOS ABS: 0 10*3/uL (ref 0.0–0.2)
Basos: 0 %
EOS (ABSOLUTE): 0 10*3/uL (ref 0.0–0.4)
EOS: 1 %
HEMATOCRIT: 41.4 % (ref 34.0–46.6)
HEMOGLOBIN: 12.3 g/dL (ref 11.1–15.9)
IMMATURE GRANULOCYTES: 0 %
Immature Grans (Abs): 0 10*3/uL (ref 0.0–0.1)
LYMPHS ABS: 3.6 10*3/uL — AB (ref 0.7–3.1)
Lymphs: 43 %
MCH: 21.7 pg — ABNORMAL LOW (ref 26.6–33.0)
MCHC: 29.7 g/dL — AB (ref 31.5–35.7)
MCV: 73 fL — ABNORMAL LOW (ref 79–97)
MONOCYTES: 4 %
Monocytes Absolute: 0.3 10*3/uL (ref 0.1–0.9)
NEUTROS PCT: 52 %
Neutrophils Absolute: 4.4 10*3/uL (ref 1.4–7.0)
Platelets: 259 10*3/uL (ref 150–379)
RBC: 5.66 x10E6/uL — AB (ref 3.77–5.28)
RDW: 16.7 % — AB (ref 12.3–15.4)
WBC: 8.5 10*3/uL (ref 3.4–10.8)

## 2016-12-28 LAB — CMP14+EGFR
ALK PHOS: 105 IU/L (ref 39–117)
ALT: 18 IU/L (ref 0–32)
AST: 24 IU/L (ref 0–40)
Albumin/Globulin Ratio: 1.2 (ref 1.2–2.2)
Albumin: 4 g/dL (ref 3.6–4.8)
BUN/Creatinine Ratio: 19 (ref 12–28)
BUN: 14 mg/dL (ref 8–27)
Bilirubin Total: <0.2 mg/dL (ref 0.0–1.2)
CO2: 20 mmol/L (ref 20–29)
CREATININE: 0.75 mg/dL (ref 0.57–1.00)
Calcium: 9.5 mg/dL (ref 8.7–10.3)
Chloride: 102 mmol/L (ref 96–106)
GFR calc Af Amer: 98 mL/min/{1.73_m2} (ref >59)
GFR, EST NON AFRICAN AMERICAN: 85 mL/min/{1.73_m2} (ref >59)
GLOBULIN, TOTAL: 3.4 g/dL (ref 1.5–4.5)
GLUCOSE: 429 mg/dL — AB (ref 65–99)
Potassium: 4.6 mmol/L (ref 3.5–5.2)
SODIUM: 141 mmol/L (ref 134–144)
Total Protein: 7.4 g/dL (ref 6.0–8.5)

## 2016-12-28 LAB — LIPID PANEL
CHOLESTEROL TOTAL: 269 mg/dL — AB (ref 100–199)
Chol/HDL Ratio: 7.7 ratio — ABNORMAL HIGH (ref 0.0–4.4)
HDL: 35 mg/dL — AB (ref >39)
TRIGLYCERIDES: 451 mg/dL — AB (ref 0–149)

## 2016-12-28 LAB — TSH: TSH: 0.782 u[IU]/mL (ref 0.450–4.500)

## 2016-12-28 MED ORDER — INSULIN REGULAR HUMAN 100 UNIT/ML IJ SOLN: 15.0000 [IU] | Freq: Three times a day (TID) | INTRAMUSCULAR | 11 refills | Status: DC

## 2016-12-28 MED FILL — ?HUMULIN R 100 UNITS/ML VIA: 100 | 44 days supply | Qty: 20 | Fill #0

## 2016-12-28 MED FILL — VESIcare 10 MG TABS: 10 | 30 days supply | Qty: 30 | Fill #0

## 2016-12-29 ENCOUNTER — Other Ambulatory Visit: Payer: Self-pay | Admitting: Internal Medicine

## 2016-12-29 DIAGNOSIS — E785 Hyperlipidemia, unspecified: Principal | ICD-10-CM

## 2016-12-29 DIAGNOSIS — E1169 Type 2 diabetes mellitus with other specified complication: Secondary | ICD-10-CM

## 2016-12-29 LAB — URINE CULTURE

## 2016-12-29 MED ORDER — ATORVASTATIN CALCIUM 40 MG PO TABS: 40.0000 mg | ORAL_TABLET | Freq: Every day | ORAL | 3 refills | Status: DC

## 2016-12-29 MED FILL — ?ATORVASTATIN 40MG TABLET: 40 | 30 days supply | Qty: 30 | Fill #0

## 2017-01-01 ENCOUNTER — Ambulatory Visit: Payer: Self-pay | Admitting: Internal Medicine

## 2017-01-16 ENCOUNTER — Ambulatory Visit (HOSPITAL_COMMUNITY)
Admission: RE | Admit: 2017-01-16 | Discharge: 2017-01-16 | Disposition: A | Discharge status: Home / Self Care | Payer: Self-pay | Source: Ambulatory Visit | Attending: Internal Medicine | Admitting: Internal Medicine

## 2017-01-16 DIAGNOSIS — I5030 Unspecified diastolic (congestive) heart failure: Secondary | ICD-10-CM | POA: Insufficient documentation

## 2017-01-16 DIAGNOSIS — R059 Cough, unspecified: Secondary | ICD-10-CM

## 2017-01-16 DIAGNOSIS — R05 Cough: Secondary | ICD-10-CM

## 2017-01-16 DIAGNOSIS — I10 Essential (primary) hypertension: Secondary | ICD-10-CM

## 2017-01-16 DIAGNOSIS — I420 Dilated cardiomyopathy: Secondary | ICD-10-CM | POA: Insufficient documentation

## 2017-01-16 DIAGNOSIS — I060 Rheumatic aortic stenosis: Secondary | ICD-10-CM | POA: Insufficient documentation

## 2017-01-16 HISTORY — PX: TRANSTHORACIC ECHOCARDIOGRAM: SHX275

## 2017-01-16 NOTE — Progress Notes (Signed)
  Echocardiogram 2D Echocardiogram has been performed.  [name] F 01/16/2017, 3:52 PM

## 2017-01-20 DIAGNOSIS — I5189 Other ill-defined heart diseases: Secondary | ICD-10-CM

## 2017-01-20 HISTORY — DX: Other ill-defined heart diseases: I51.89

## 2017-01-22 ENCOUNTER — Telehealth: Payer: Self-pay | Admitting: *Deleted

## 2017-01-22 NOTE — Telephone Encounter (Signed)
Medical Assistant left message on patient's home and cell voicemail. Voicemail states to give a call back to [place] with [facility] at [phone]. Patient is aware of echo being normal showing an left ventricle ejection at 60-65%.

## 2017-01-22 NOTE — Telephone Encounter (Signed)
-----   Message from Tresa Garter, MD sent at 01/20/2017 12:46 PM EDT ----- Please inform patient that her echocardiogram is normal with a left ventricular ejection fraction of 60-65% which means good pumping activity.

## 2017-01-26 MED FILL — ?METFORMIN HCL 1,000 MG TAB: 1000 | 30 days supply | Qty: 60 | Fill #1

## 2017-01-26 MED FILL — ?ATORVASTATIN 40MG TABLET: 40 | 30 days supply | Qty: 30 | Fill #1

## 2017-02-12 ENCOUNTER — Ambulatory Visit: Payer: Self-pay | Attending: Internal Medicine | Admitting: Internal Medicine

## 2017-02-12 ENCOUNTER — Encounter: Payer: Self-pay | Admitting: Internal Medicine

## 2017-02-12 VITALS — BP 126/78 | HR 81 | Temp 98.2°F | Resp 18 | Ht 61.0 in | Wt 161.0 lb

## 2017-02-12 DIAGNOSIS — E785 Hyperlipidemia, unspecified: Secondary | ICD-10-CM | POA: Insufficient documentation

## 2017-02-12 DIAGNOSIS — I10 Essential (primary) hypertension: Secondary | ICD-10-CM | POA: Insufficient documentation

## 2017-02-12 DIAGNOSIS — Z9889 Other specified postprocedural states: Secondary | ICD-10-CM | POA: Insufficient documentation

## 2017-02-12 DIAGNOSIS — Z79899 Other long term (current) drug therapy: Secondary | ICD-10-CM | POA: Insufficient documentation

## 2017-02-12 DIAGNOSIS — C4489 Other specified malignant neoplasm of overlapping sites of skin: Secondary | ICD-10-CM | POA: Insufficient documentation

## 2017-02-12 DIAGNOSIS — Z794 Long term (current) use of insulin: Secondary | ICD-10-CM | POA: Insufficient documentation

## 2017-02-12 DIAGNOSIS — E119 Type 2 diabetes mellitus without complications: Secondary | ICD-10-CM | POA: Insufficient documentation

## 2017-02-12 MED ORDER — LISINOPRIL 10 MG PO TABS: 10.0000 mg | ORAL_TABLET | Freq: Every day | ORAL | 3 refills | Status: DC

## 2017-02-12 MED FILL — TRUE METRIX TEST STRIP: 30 days supply | Qty: 100 | Fill #1

## 2017-02-12 MED FILL — LISINOPRIL 10 MG TABS: 10 | 30 days supply | Qty: 30 | Fill #0

## 2017-02-12 MED FILL — OXYBUTYNIN CL ER 10 MG TAB: 10 | 30 days supply | Qty: 30 | Fill #0

## 2017-02-12 MED FILL — TRUEplus LANCETS 28G MISC: 30 days supply | Qty: 100 | Fill #1

## 2017-02-12 NOTE — Progress Notes (Signed)
Ellen Ryan, is a 61 y.o. female  [id number]  [id number]  DOB - 02-Feb-1956  Chief Complaint  Patient presents with  . Diabetes       Subjective:   Ellen Ryan is a 61 y.o. 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 She was recently diagnosed with type 2 diabetes mellitus with HbA1C above 12%. She was started on Metformin and Insulin to scale. He daughter who is a Designer, jewellery claims her BS has been consistently below 150 that she has stopped using insulin for over 2 weeks. She is much better today, she said "I am so happy". No more excessive urination, no pain, no excessive thirst or polyphagia. She sleeps well, no abdominal pain. Patient has No headache, No chest pain, No Nausea, No new weakness tingling or numbness, No Cough - SOB.  No problems updated.  ALLERGIES: No Known Allergies  PAST MEDICAL HISTORY: Past Medical History:  Diagnosis Date  . Arthritis    both knees  . Diabetes mellitus, new onset ([place]) 12/27/2016  . Hyperlipidemia   . Hypertension     MEDICATIONS AT HOME: Prior to Admission medications   Medication Sig Start Date End Date Taking? Authorizing Provider  atorvastatin (LIPITOR) 40 MG tablet Take 1 tablet (40 mg total) by mouth daily. 12/29/16  Yes Tresa Garter, MD  Blood Glucose Monitoring Suppl (TRUE METRIX METER) w/Device KIT Use as directed 12/27/16  Yes Jceon Alverio E, MD  glucose blood (TRUE METRIX BLOOD GLUCOSE TEST) test strip Use as instructed 12/27/16  Yes Braylon Lemmons E, MD  insulin regular (HUMULIN R) 100 units/mL injection Inject 0.15 mLs (15 Units total) into the skin 3 (three) times daily before meals. 12/28/16  Yes Tresa Garter, MD  metFORMIN (GLUCOPHAGE) 1000 MG tablet Take 1 tablet (1,000 mg total) by mouth 2 (two) times daily with a meal. 12/27/16  Yes Alizey Noren E, MD  Multiple  Vitamin (MULTIVITAMIN WITH MINERALS) TABS tablet Take 1 tablet by mouth daily. Centrum   Yes [provider]  Omega-3 Fatty Acids (FISH OIL PO) Take 1 capsule by mouth daily.   Yes [provider]  senna-docusate (SENOKOT-S) 8.6-50 MG tablet Take 2 tablets by mouth at bedtime. 09/22/16  Yes Cross, Melissa D, NP  solifenacin (VESICARE) 10 MG tablet Take 1 tablet (10 mg total) by mouth daily. 12/27/16  Yes Tresa Garter, MD  TRUEPLUS LANCETS 28G MISC Check sugar three times a day 12/27/16  Yes Durene Dodge E, MD  lisinopril (PRINIVIL,ZESTRIL) 10 MG tablet Take 1 tablet (10 mg total) by mouth daily. 02/12/17   Tresa Garter, MD    Objective:   Vitals:   02/12/17 1353  BP: 126/78  Pulse: 81  Resp: 18  Temp: 98.2 F (36.8 C)  TempSrc: Oral  SpO2: 97%  Weight: 161 lb (73 kg)  Height: '5\' 1"'  (1.549 m)   Exam General appearance : Awake, alert, not in any distress. Speech Clear. Not toxic looking HEENT: Atraumatic and Normocephalic, pupils equally reactive to light and accomodation Neck: Supple, no JVD. No cervical lymphadenopathy.  Chest: Good air entry bilaterally, no added sounds  CVS: S1 S2 regular, no murmurs.  Abdomen: Bowel sounds present, Non tender and not distended with no gaurding, rigidity or rebound. Extremities: B/L Lower Ext shows no edema, both legs are warm to touch Neurology: Awake alert, and oriented X 3, CN II-XII intact,  Non focal Skin: No Rash  Data Review Lab Results  Component Value Date   HGBA1C 12.8 12/27/2016   HGBA1C 5.7 04/06/2016    Assessment & Plan   1. Diabetes mellitus, new onset ([place])  - Glucose (CBG)  Aim for 30 minutes of exercise most days. Rethink what you drink. Water is great! Aim for 2-3 Carb Choices per meal (30-45 grams) +/- 1 either way  Aim for 0-15 Carbs per snack if hungry  Include protein in moderation with your meals and snacks  Consider reading food labels for Total Carbohydrate and Fat Grams  of foods  Consider checking BG at alternate times per day  Continue taking medication as directed Be mindful about how much sugar you are adding to beverages and other foods. Fruit Punch - find one with no sugar  Measure and decrease portions of carbohydrate foods  Make your plate and don't go back for seconds  2. Essential hypertension  - Discontinue Amlodipine, start ACEI - lisinopril (PRINIVIL,ZESTRIL) 10 MG tablet; Take 1 tablet (10 mg total) by mouth daily.  Dispense: 90 tablet; Refill: 3  We have discussed target BP range and blood pressure goal. I have advised patient to check BP regularly and to call us back or report to clinic if the numbers are consistently higher than 140/90. We discussed the importance of compliance with medical therapy and DASH diet recommended, consequences of uncontrolled hypertension discussed.  - continue current BP medications  Patient have been counseled extensively about nutrition and exercise. Other issues discussed during this visit include: low cholesterol diet, weight control and daily exercise, foot care, annual eye examinations at Ophthalmology, importance of adherence with medications and regular follow-up. We also discussed long term complications of uncontrolled diabetes and hypertension.   Return in about 2 months (around 04/14/2017) for Hemoglobin A1C and Follow up, DM, Follow up HTN.  The patient was given clear instructions to go to ER or return to medical center if symptoms don't improve, worsen or new problems develop. The patient verbalized understanding. The patient was told to call to get lab results if they haven't heard anything in the next week.   This note has been created with Surveyor, quantity. Any transcriptional errors are unintentional.    Angelica Chessman, MD, [place], [place], [place], [place] and [place], [place]   02/12/2017, 3:05  PM

## 2017-02-12 NOTE — Patient Instructions (Signed)
DASH Eating Plan DASH stands for "Dietary Approaches to Stop Hypertension." The DASH eating plan is a healthy eating plan that has been shown to reduce high blood pressure (hypertension). It may also reduce your risk for type 2 diabetes, heart disease, and stroke. The DASH eating plan may also help with weight loss. What are tips for following this plan? General guidelines  Avoid eating more than 2,300 mg (milligrams) of salt (sodium) a day. If you have hypertension, you may need to reduce your sodium intake to 1,500 mg a day.  Limit alcohol intake to no more than 1 drink a day for nonpregnant women and 2 drinks a day for men. One drink equals 12 oz of beer, 5 oz of wine, or 1 oz of hard liquor.  Work with your health care provider to maintain a healthy body weight or to lose weight. Ask what an ideal weight is for you.  Get at least 30 minutes of exercise that causes your heart to beat faster (aerobic exercise) most days of the week. Activities may include walking, swimming, or biking.  Work with your health care provider or diet and nutrition specialist (dietitian) to adjust your eating plan to your individual calorie needs. Reading food labels  Check food labels for the amount of sodium per serving. Choose foods with less than 5 percent of the Daily Value of sodium. Generally, foods with less than 300 mg of sodium per serving fit into this eating plan.  To find whole grains, look for the word "whole" as the first word in the ingredient list. Shopping  Buy products labeled as "low-sodium" or "no salt added."  Buy fresh foods. Avoid canned foods and premade or frozen meals. Cooking  Avoid adding salt when cooking. Use salt-free seasonings or herbs instead of table salt or sea salt. Check with your health care provider or pharmacist before using salt substitutes.  Do not fry foods. Cook foods using healthy methods such as baking, boiling, grilling, and broiling instead.  Cook with  heart-healthy oils, such as olive, canola, soybean, or sunflower oil. Meal planning   Eat a balanced diet that includes: ? 5 or more servings of fruits and vegetables each day. At each meal, try to fill half of your plate with fruits and vegetables. ? Up to 6-8 servings of whole grains each day. ? Less than 6 oz of lean meat, poultry, or fish each day. A 3-oz serving of meat is about the same size as a deck of cards. One egg equals 1 oz. ? 2 servings of low-fat dairy each day. ? A serving of nuts, seeds, or beans 5 times each week. ? Heart-healthy fats. Healthy fats called Omega-3 fatty acids are found in foods such as flaxseeds and coldwater fish, like sardines, salmon, and mackerel.  Limit how much you eat of the following: ? Canned or prepackaged foods. ? Food that is high in trans fat, such as fried foods. ? Food that is high in saturated fat, such as fatty meat. ? Sweets, desserts, sugary drinks, and other foods with added sugar. ? Full-fat dairy products.  Do not salt foods before eating.  Try to eat at least 2 vegetarian meals each week.  Eat more home-cooked food and less restaurant, buffet, and fast food.  When eating at a restaurant, ask that your food be prepared with less salt or no salt, if possible. What foods are recommended? The items listed may not be a complete list. Talk with your dietitian about what   dietary choices are best for you. Grains Whole-grain or whole-wheat bread. Whole-grain or whole-wheat pasta. Brown rice. Oatmeal. Quinoa. Bulgur. Whole-grain and low-sodium cereals. Pita bread. Low-fat, low-sodium crackers. Whole-wheat flour tortillas. Vegetables Fresh or frozen vegetables (raw, steamed, roasted, or grilled). Low-sodium or reduced-sodium tomato and vegetable juice. Low-sodium or reduced-sodium tomato sauce and tomato paste. Low-sodium or reduced-sodium canned vegetables. Fruits All fresh, dried, or frozen fruit. Canned fruit in natural juice (without  added sugar). Meat and other protein foods Skinless chicken or turkey. Ground chicken or turkey. Pork with fat trimmed off. Fish and seafood. Egg whites. Dried beans, peas, or lentils. Unsalted nuts, nut butters, and seeds. Unsalted canned beans. Lean cuts of beef with fat trimmed off. Low-sodium, lean deli meat. Dairy Low-fat (1%) or fat-free (skim) milk. Fat-free, low-fat, or reduced-fat cheeses. Nonfat, low-sodium ricotta or cottage cheese. Low-fat or nonfat yogurt. Low-fat, low-sodium cheese. Fats and oils Soft margarine without trans fats. Vegetable oil. Low-fat, reduced-fat, or light mayonnaise and salad dressings (reduced-sodium). Canola, safflower, olive, soybean, and sunflower oils. Avocado. Seasoning and other foods Herbs. Spices. Seasoning mixes without salt. Unsalted popcorn and pretzels. Fat-free sweets. What foods are not recommended? The items listed may not be a complete list. Talk with your dietitian about what dietary choices are best for you. Grains Baked goods made with fat, such as croissants, muffins, or some breads. Dry pasta or rice meal packs. Vegetables Creamed or fried vegetables. Vegetables in a cheese sauce. Regular canned vegetables (not low-sodium or reduced-sodium). Regular canned tomato sauce and paste (not low-sodium or reduced-sodium). Regular tomato and vegetable juice (not low-sodium or reduced-sodium). Pickles. Olives. Fruits Canned fruit in a light or heavy syrup. Fried fruit. Fruit in cream or butter sauce. Meat and other protein foods Fatty cuts of meat. Ribs. Fried meat. Bacon. Sausage. Bologna and other processed lunch meats. Salami. Fatback. Hotdogs. Bratwurst. Salted nuts and seeds. Canned beans with added salt. Canned or smoked fish. Whole eggs or egg yolks. Chicken or turkey with skin. Dairy Whole or 2% milk, cream, and half-and-half. Whole or full-fat cream cheese. Whole-fat or sweetened yogurt. Full-fat cheese. Nondairy creamers. Whipped toppings.  Processed cheese and cheese spreads. Fats and oils Butter. Stick margarine. Lard. Shortening. Ghee. Bacon fat. Tropical oils, such as coconut, palm kernel, or palm oil. Seasoning and other foods Salted popcorn and pretzels. Onion salt, garlic salt, seasoned salt, table salt, and sea salt. Worcestershire sauce. Tartar sauce. Barbecue sauce. Teriyaki sauce. Soy sauce, including reduced-sodium. Steak sauce. Canned and packaged gravies. Fish sauce. Oyster sauce. Cocktail sauce. Horseradish that you find on the shelf. Ketchup. Mustard. Meat flavorings and tenderizers. Bouillon cubes. Hot sauce and Tabasco sauce. Premade or packaged marinades. Premade or packaged taco seasonings. Relishes. Regular salad dressings. Where to find more information:  National Heart, Lung, and Blood Institute: www.nhlbi.nih.gov  American Heart Association: www.heart.org Summary  The DASH eating plan is a healthy eating plan that has been shown to reduce high blood pressure (hypertension). It may also reduce your risk for type 2 diabetes, heart disease, and stroke.  With the DASH eating plan, you should limit salt (sodium) intake to 2,300 mg a day. If you have hypertension, you may need to reduce your sodium intake to 1,500 mg a day.  When on the DASH eating plan, aim to eat more fresh fruits and vegetables, whole grains, lean proteins, low-fat dairy, and heart-healthy fats.  Work with your health care provider or diet and nutrition specialist (dietitian) to adjust your eating plan to your individual   calorie needs. This information is not intended to replace advice given to you by your health care provider. Make sure you discuss any questions you have with your health care provider. Document Released: 03/30/2011 Document Revised: 04/03/2016 Document Reviewed: 04/03/2016 Elsevier Interactive Patient Education  2017 [employer] Hypertension Hypertension, commonly called high blood pressure, is when the force of blood  pumping through the arteries is too strong. The arteries are the blood vessels that carry blood from the heart throughout the body. Hypertension forces the heart to work harder to pump blood and may cause arteries to become narrow or stiff. Having untreated or uncontrolled hypertension can cause heart attacks, strokes, kidney disease, and other problems. A blood pressure reading consists of a higher number over a lower number. Ideally, your blood pressure should be below 120/80. The first ("top") number is called the systolic pressure. It is a measure of the pressure in your arteries as your heart beats. The second ("bottom") number is called the diastolic pressure. It is a measure of the pressure in your arteries as the heart relaxes. What are the causes? The cause of this condition is not known. What increases the risk? Some risk factors for high blood pressure are under your control. Others are not. Factors you can change  Smoking.  Having type 2 diabetes mellitus, high cholesterol, or both.  Not getting enough exercise or physical activity.  Being overweight.  Having too much fat, sugar, calories, or salt (sodium) in your diet.  Drinking too much alcohol. Factors that are difficult or impossible to change  Having chronic kidney disease.  Having a family history of high blood pressure.  Age. Risk increases with age.  Race. You may be at higher risk if you are African-American.  Gender. Men are at higher risk than women before age 45. After age 65, women are at higher risk than men.  Having obstructive sleep apnea.  Stress. What are the signs or symptoms? Extremely high blood pressure (hypertensive crisis) may cause:  Headache.  Anxiety.  Shortness of breath.  Nosebleed.  Nausea and vomiting.  Severe chest pain.  Jerky movements you cannot control (seizures).  How is this diagnosed? This condition is diagnosed by measuring your blood pressure while you are  seated, with your arm resting on a surface. The cuff of the blood pressure monitor will be placed directly against the skin of your upper arm at the level of your heart. It should be measured at least twice using the same arm. Certain conditions can cause a difference in blood pressure between your right and left arms. Certain factors can cause blood pressure readings to be lower or higher than normal (elevated) for a short period of time:  When your blood pressure is higher when you are in a health care provider's office than when you are at home, this is called white coat hypertension. Most people with this condition do not need medicines.  When your blood pressure is higher at home than when you are in a health care provider's office, this is called masked hypertension. Most people with this condition may need medicines to control blood pressure.  If you have a high blood pressure reading during one visit or you have normal blood pressure with other risk factors:  You may be asked to return on a different day to have your blood pressure checked again.  You may be asked to monitor your blood pressure at home for 1 week or longer.  If you are diagnosed   with hypertension, you may have other blood or imaging tests to help your health care provider understand your overall risk for other conditions. How is this treated? This condition is treated by making healthy lifestyle changes, such as eating healthy foods, exercising more, and reducing your alcohol intake. Your health care provider may prescribe medicine if lifestyle changes are not enough to get your blood pressure under control, and if:  Your systolic blood pressure is above 130.  Your diastolic blood pressure is above 80.  Your personal target blood pressure may vary depending on your medical conditions, your age, and other factors. Follow these instructions at home: Eating and drinking  Eat a diet that is high in fiber and potassium,  and low in sodium, added sugar, and fat. An example eating plan is called the DASH (Dietary Approaches to Stop Hypertension) diet. To eat this way: ? Eat plenty of fresh fruits and vegetables. Try to fill half of your plate at each meal with fruits and vegetables. ? Eat whole grains, such as whole wheat pasta, brown rice, or whole grain bread. Fill about one quarter of your plate with whole grains. ? Eat or drink low-fat dairy products, such as skim milk or low-fat yogurt. ? Avoid fatty cuts of meat, processed or cured meats, and poultry with skin. Fill about one quarter of your plate with lean proteins, such as fish, chicken without skin, beans, eggs, and tofu. ? Avoid premade and processed foods. These tend to be higher in sodium, added sugar, and fat.  Reduce your daily sodium intake. Most people with hypertension should eat less than 1,500 mg of sodium a day.  Limit alcohol intake to no more than 1 drink a day for nonpregnant women and 2 drinks a day for men. One drink equals 12 oz of beer, 5 oz of wine, or 1 oz of hard liquor. Lifestyle  Work with your health care provider to maintain a healthy body weight or to lose weight. Ask what an ideal weight is for you.  Get at least 30 minutes of exercise that causes your heart to beat faster (aerobic exercise) most days of the week. Activities may include walking, swimming, or biking.  Include exercise to strengthen your muscles (resistance exercise), such as pilates or lifting weights, as part of your weekly exercise routine. Try to do these types of exercises for 30 minutes at least 3 days a week.  Do not use any products that contain nicotine or tobacco, such as cigarettes and e-cigarettes. If you need help quitting, ask your health care provider.  Monitor your blood pressure at home as told by your health care provider.  Keep all follow-up visits as told by your health care provider. This is important. Medicines  Take over-the-counter and  prescription medicines only as told by your health care provider. Follow directions carefully. Blood pressure medicines must be taken as prescribed.  Do not skip doses of blood pressure medicine. Doing this puts you at risk for problems and can make the medicine less effective.  Ask your health care provider about side effects or reactions to medicines that you should watch for. Contact a health care provider if:  You think you are having a reaction to a medicine you are taking.  You have headaches that keep coming back (recurring).  You feel dizzy.  You have swelling in your ankles.  You have trouble with your vision. Get help right away if:  You develop a severe headache or confusion.  You   have unusual weakness or numbness.  You feel faint.  You have severe pain in your chest or abdomen.  You vomit repeatedly.  You have trouble breathing. Summary  Hypertension is when the force of blood pumping through your arteries is too strong. If this condition is not controlled, it may put you at risk for serious complications.  Your personal target blood pressure may vary depending on your medical conditions, your age, and other factors. For most people, a normal blood pressure is less than 120/80.  Hypertension is treated with lifestyle changes, medicines, or a combination of both. Lifestyle changes include weight loss, eating a healthy, low-sodium diet, exercising more, and limiting alcohol. This information is not intended to replace advice given to you by your health care provider. Make sure you discuss any questions you have with your health care provider. Document Released: 04/10/2005 Document Revised: 03/08/2016 Document Reviewed: 03/08/2016 Elsevier Interactive Patient Education  2018 [employer]. Diabetes Mellitus and Food It is important for you to manage your blood sugar (glucose) level. Your blood glucose level can be greatly affected by what you eat. Eating healthier  foods in the appropriate amounts throughout the day at about the same time each day will help you control your blood glucose level. It can also help slow or prevent worsening of your diabetes mellitus. Healthy eating may even help you improve the level of your blood pressure and reach or maintain a healthy weight. General recommendations for healthful eating and cooking habits include:  Eating meals and snacks regularly. Avoid going long periods of time without eating to lose weight.  Eating a diet that consists mainly of plant-based foods, such as fruits, vegetables, nuts, legumes, and whole grains.  Using low-heat cooking methods, such as baking, instead of high-heat cooking methods, such as deep frying.  Work with your dietitian to make sure you understand how to use the Nutrition Facts information on food labels. How can food affect me? Carbohydrates Carbohydrates affect your blood glucose level more than any other type of food. Your dietitian will help you determine how many carbohydrates to eat at each meal and teach you how to count carbohydrates. Counting carbohydrates is important to keep your blood glucose at a healthy level, especially if you are using insulin or taking certain medicines for diabetes mellitus. Alcohol Alcohol can cause sudden decreases in blood glucose (hypoglycemia), especially if you use insulin or take certain medicines for diabetes mellitus. Hypoglycemia can be a life-threatening condition. Symptoms of hypoglycemia (sleepiness, dizziness, and disorientation) are similar to symptoms of having too much alcohol. If your health care provider has given you approval to drink alcohol, do so in moderation and use the following guidelines:  Women should not have more than one drink per day, and men should not have more than two drinks per day. One drink is equal to: ? 12 oz of beer. ? 5 oz of wine. ? 1 oz of hard liquor.  Do not drink on an empty stomach.  Keep  yourself hydrated. Have water, diet soda, or unsweetened iced tea.  Regular soda, juice, and other mixers might contain a lot of carbohydrates and should be counted.  What foods are not recommended? As you make food choices, it is important to remember that all foods are not the same. Some foods have fewer nutrients per serving than other foods, even though they might have the same number of calories or carbohydrates. It is difficult to get your body what it needs when you eat  foods with fewer nutrients. Examples of foods that you should avoid that are high in calories and carbohydrates but low in nutrients include:  Trans fats (most processed foods list trans fats on the Nutrition Facts label).  Regular soda.  Juice.  Candy.  Sweets, such as cake, pie, doughnuts, and cookies.  Fried foods.  What foods can I eat? Eat nutrient-rich foods, which will nourish your body and keep you healthy. The food you should eat also will depend on several factors, including:  The calories you need.  The medicines you take.  Your weight.  Your blood glucose level.  Your blood pressure level.  Your cholesterol level.  You should eat a variety of foods, including:  Protein. ? Lean cuts of meat. ? Proteins low in saturated fats, such as fish, egg whites, and beans. Avoid processed meats.  Fruits and vegetables. ? Fruits and vegetables that may help control blood glucose levels, such as apples, mangoes, and yams.  Dairy products. ? Choose fat-free or low-fat dairy products, such as milk, yogurt, and cheese.  Grains, bread, pasta, and rice. ? Choose whole grain products, such as multigrain bread, whole oats, and brown rice. These foods may help control blood pressure.  Fats. ? Foods containing healthful fats, such as nuts, avocado, olive oil, canola oil, and fish.  Does everyone with diabetes mellitus have the same meal plan? Because every person with diabetes mellitus is different,  there is not one meal plan that works for everyone. It is very important that you meet with a dietitian who will help you create a meal plan that is just right for you. This information is not intended to replace advice given to you by your health care provider. Make sure you discuss any questions you have with your health care provider. Document Released: 01/05/2005 Document Revised: 09/16/2015 Document Reviewed: 03/07/2013 Elsevier Interactive Patient Education  2017 [employer]. Diabetes Mellitus and Exercise Exercising regularly is important for your overall health, especially when you have diabetes (diabetes mellitus). Exercising is not only about losing weight. It has many health benefits, such as increasing muscle strength and bone density and reducing body fat and stress. This leads to improved fitness, flexibility, and endurance, all of which result in better overall health. Exercise has additional benefits for people with diabetes, including:  Reducing appetite.  Helping to lower and control blood glucose.  Lowering blood pressure.  Helping to control amounts of fatty substances (lipids) in the blood, such as cholesterol and triglycerides.  Helping the body to respond better to insulin (improving insulin sensitivity).  Reducing how much insulin the body needs.  Decreasing the risk for heart disease by: ? Lowering cholesterol and triglyceride levels. ? Increasing the levels of good cholesterol. ? Lowering blood glucose levels.  What is my activity plan? Your health care provider or certified diabetes educator can help you make a plan for the type and frequency of exercise (activity plan) that works for you. Make sure that you:  Do at least 150 minutes of moderate-intensity or vigorous-intensity exercise each week. This could be brisk walking, biking, or water aerobics. ? Do stretching and strength exercises, such as yoga or weightlifting, at least 2 times a week. ? Spread out  your activity over at least 3 days of the week.  Get some form of physical activity every day. ? Do not go more than 2 days in a row without some kind of physical activity. ? Avoid being inactive for more than 90 minutes at  a time. Take frequent breaks to walk or stretch.  Choose a type of exercise or activity that you enjoy, and set realistic goals.  Start slowly, and gradually increase the intensity of your exercise over time.  What do I need to know about managing my diabetes?  Check your blood glucose before and after exercising. ? If your blood glucose is higher than 240 mg/dL (13.3 mmol/L) before you exercise, check your urine for ketones. If you have ketones in your urine, do not exercise until your blood glucose returns to normal.  Know the symptoms of low blood glucose (hypoglycemia) and how to treat it. Your risk for hypoglycemia increases during and after exercise. Common symptoms of hypoglycemia can include: ? Hunger. ? Anxiety. ? Sweating and feeling clammy. ? Confusion. ? Dizziness or feeling light-headed. ? Increased heart rate or palpitations. ? Blurry vision. ? Tingling or numbness around the mouth, lips, or tongue. ? Tremors or shakes. ? Irritability.  Keep a rapid-acting carbohydrate snack available before, during, and after exercise to help prevent or treat hypoglycemia.  Avoid injecting insulin into areas of the body that are going to be exercised. For example, avoid injecting insulin into: ? The arms, when playing tennis. ? The legs, when jogging.  Keep records of your exercise habits. Doing this can help you and your health care provider adjust your diabetes management plan as needed. Write down: ? Food that you eat before and after you exercise. ? Blood glucose levels before and after you exercise. ? The type and amount of exercise you have done. ? When your insulin is expected to peak, if you use insulin. Avoid exercising at times when your insulin is  peaking.  When you start a new exercise or activity, work with your health care provider to make sure the activity is safe for you, and to adjust your insulin, medicines, or food intake as needed.  Drink plenty of water while you exercise to prevent dehydration or heat stroke. Drink enough fluid to keep your urine clear or pale yellow. This information is not intended to replace advice given to you by your health care provider. Make sure you discuss any questions you have with your health care provider. Document Released: 07/01/2003 Document Revised: 10/29/2015 Document Reviewed: 09/20/2015 Elsevier Interactive Patient Education  2018 [employer].

## 2017-02-21 ENCOUNTER — Ambulatory Visit: Payer: Self-pay | Admitting: Internal Medicine

## 2017-02-26 MED FILL — ?ATORVASTATIN 40MG TABLET: 40 | 30 days supply | Qty: 30 | Fill #2

## 2017-02-26 MED FILL — ?METFORMIN HCL 1,000 MG TAB: 1000 | 30 days supply | Qty: 60 | Fill #2

## 2017-03-21 MED FILL — LISINOPRIL 10 MG TABS: 10 | 30 days supply | Qty: 30 | Fill #1

## 2017-03-29 MED FILL — ATORVASTATIN 40 MG TABLET: 40 | 30 days supply | Qty: 30 | Fill #3

## 2017-03-29 MED FILL — ?METFORMIN HCL 1,000 MG TAB: 1000 | 30 days supply | Qty: 60 | Fill #3

## 2017-04-12 MED FILL — OXYBUTYNIN CL ER 10 MG TAB: 10 | 30 days supply | Qty: 30 | Fill #1

## 2017-04-26 MED FILL — LISINOPRIL 10 MG TABS: 10 | 30 days supply | Qty: 30 | Fill #2

## 2017-05-01 MED FILL — ?ATORVASTATIN 40MG TABLET: 40 | 30 days supply | Qty: 30 | Fill #4

## 2017-05-01 MED FILL — ?METFORMIN HCL 1,000 MG TAB: 1000 | 30 days supply | Qty: 60 | Fill #4

## 2017-05-02 ENCOUNTER — Ambulatory Visit: Payer: Self-pay | Attending: Internal Medicine | Admitting: Internal Medicine

## 2017-05-02 ENCOUNTER — Encounter: Payer: Self-pay | Admitting: Internal Medicine

## 2017-05-02 VITALS — BP 138/87 | HR 105 | Temp 98.5°F | Resp 18 | Ht 60.0 in | Wt 165.0 lb

## 2017-05-02 DIAGNOSIS — E785 Hyperlipidemia, unspecified: Secondary | ICD-10-CM | POA: Insufficient documentation

## 2017-05-02 DIAGNOSIS — I10 Essential (primary) hypertension: Secondary | ICD-10-CM | POA: Insufficient documentation

## 2017-05-02 DIAGNOSIS — Z7984 Long term (current) use of oral hypoglycemic drugs: Secondary | ICD-10-CM | POA: Insufficient documentation

## 2017-05-02 DIAGNOSIS — E119 Type 2 diabetes mellitus without complications: Secondary | ICD-10-CM | POA: Insufficient documentation

## 2017-05-02 DIAGNOSIS — Z79899 Other long term (current) drug therapy: Secondary | ICD-10-CM | POA: Insufficient documentation

## 2017-05-02 DIAGNOSIS — E1169 Type 2 diabetes mellitus with other specified complication: Secondary | ICD-10-CM

## 2017-05-02 LAB — GLUCOSE, POCT (MANUAL RESULT ENTRY): POC GLUCOSE: 96 mg/dL (ref 70–99)

## 2017-05-02 LAB — POCT GLYCOSYLATED HEMOGLOBIN (HGB A1C): HEMOGLOBIN A1C: 5.8

## 2017-05-02 MED ORDER — ATORVASTATIN CALCIUM 40 MG PO TABS: 40.0000 mg | ORAL_TABLET | Freq: Every day | ORAL | 3 refills | Status: DC

## 2017-05-02 MED ORDER — METFORMIN HCL 500 MG PO TABS: 500.0000 mg | ORAL_TABLET | Freq: Two times a day (BID) | ORAL | 3 refills | Status: DC

## 2017-05-02 MED ORDER — LISINOPRIL 20 MG PO TABS: 10.0000 mg | ORAL_TABLET | Freq: Every day | ORAL | 3 refills | Status: DC

## 2017-05-02 NOTE — Progress Notes (Signed)
Ellen Ryan, is a 62 y.o. female  JAS:[id number]  ALP:[id number]  DOB - 09-20-1955  Chief Complaint  Patient presents with  . Diabetes      Subjective:   Ellen Ryan is a 62 y.o. 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 Patient has no complaint, adherent with medications, BS are better controlled, she has stopped using insulin because of low BS. She said "I feel good". She measures BP at home, most of the time, its around 353 systolic. She denies being depressed, she denies any suicidal ideation or thoughts. Patient has No headache, No chest pain, No abdominal pain - No Nausea, No new weakness tingling or numbness, No Cough - SOB.  No problems updated.  ALLERGIES: No Known Allergies  PAST MEDICAL HISTORY: Past Medical History:  Diagnosis Date  . Arthritis    both knees  . Diabetes mellitus, new onset ([place]) 12/27/2016  . Hyperlipidemia   . Hypertension     MEDICATIONS AT HOME: Prior to Admission medications   Medication Sig Start Date End Date Taking? Authorizing Provider  atorvastatin (LIPITOR) 40 MG tablet Take 1 tablet (40 mg total) by mouth daily. 05/02/17  Yes Tresa Garter, MD  Blood Glucose Monitoring Suppl (TRUE METRIX METER) w/Device KIT Use as directed 12/27/16  Yes Jegede, Olugbemiga E, MD  glucose blood (TRUE METRIX BLOOD GLUCOSE TEST) test strip Use as instructed 12/27/16  Yes Jegede, Olugbemiga E, MD  lisinopril (PRINIVIL,ZESTRIL) 20 MG tablet Take 0.5 tablets (10 mg total) by mouth daily. 05/02/17  Yes Tresa Garter, MD  Multiple Vitamin (MULTIVITAMIN WITH MINERALS) TABS tablet Take 1 tablet by mouth daily. Centrum   Yes [provider]  Omega-3 Fatty Acids (FISH OIL PO) Take 1 capsule by mouth daily.   Yes [provider]  senna-docusate (SENOKOT-S) 8.6-50 MG tablet Take 2  tablets by mouth at bedtime. 09/22/16  Yes Cross, Lenna Sciara D, NP  TRUEPLUS LANCETS 28G MISC Check sugar three times a day 12/27/16  Yes Jegede, Olugbemiga E, MD  metFORMIN (GLUCOPHAGE) 500 MG tablet Take 1 tablet (500 mg total) by mouth 2 (two) times daily with a meal. 05/02/17   Tresa Garter, MD    Objective:   Vitals:   05/02/17 0923  BP: 138/87  Pulse: (!) 105  Resp: 18  Temp: 98.5 F (36.9 C)  TempSrc: Oral  SpO2: 98%  Weight: 165 lb (74.8 kg)  Height: 5' (1.524 m)   Exam General appearance : Awake, alert, not in any distress. Speech Clear. Not toxic looking, obese HEENT: Atraumatic and Normocephalic, pupils equally reactive to light and accomodation Neck: Supple, no JVD. No cervical lymphadenopathy.  Chest: Good air entry bilaterally, no added sounds  CVS: S1 S2 regular, no murmurs.  Abdomen: Bowel sounds present, Non tender and not distended with no gaurding, rigidity or rebound. Extremities: B/L Lower Ext shows no edema, both legs are warm to touch Neurology: Awake alert, and oriented X 3, CN II-XII intact, Non focal Skin: No Rash  Data Review Lab Results  Component Value Date   HGBA1C 5.8 05/02/2017   HGBA1C 12.8 12/27/2016   HGBA1C 5.7 04/06/2016    Assessment & Plan   1. Diabetes mellitus, new onset ([place])  - D/C insulin - Reduce metformin to 500 mg BiD from 1000 mg 2x daily - POCT A1C now 5.8% - Glucose (CBG) -  CMP14+EGFR  2. Essential hypertension Increase Lisinopril to 20 mg tab daily - lisinopril (PRINIVIL,ZESTRIL) 20 MG tablet; Take 0.5 tablets (10 mg total) by mouth daily.  Dispense: 30 tablet; Refill: 3  3. Dyslipidemia associated with type 2 diabetes mellitus (HCC)  - atorvastatin (LIPITOR) 40 MG tablet; Take 1 tablet (40 mg total) by mouth daily.  Dispense: 90 tablet; Refill: 3 - Lipid panel  Patient have been counseled extensively about nutrition and exercise. Other issues discussed during this visit include: low cholesterol diet, weight  control and daily exercise, foot care, annual eye examinations at Ophthalmology, importance of adherence with medications and regular follow-up. We also discussed long term complications of uncontrolled diabetes and hypertension.   Return in about 6 months (around 10/30/2017) for Hemoglobin A1C and Follow up, DM, Follow up HTN.  The patient was given clear instructions to go to ER or return to medical center if symptoms don't improve, worsen or new problems develop. The patient verbalized understanding. The patient was told to call to get lab results if they haven't heard anything in the next week.   This note has been created with Surveyor, quantity. Any transcriptional errors are unintentional.    Angelica Chessman, MD, [place], Karilyn Cota, [place] and [facility] [place], [place]   05/02/2017, 10:02 AM

## 2017-05-02 NOTE — Patient Instructions (Signed)
Diabetes and Foot Care Diabetes may cause you to have problems because of poor blood supply (circulation) to your feet and legs. This may cause the skin on your feet to become thinner, break easier, and heal more slowly. Your skin may become dry, and the skin may peel and crack. You may also have nerve damage in your legs and feet causing decreased feeling in them. You may not notice minor injuries to your feet that could lead to infections or more serious problems. Taking care of your feet is one of the most important things you can do for yourself. Follow these instructions at home:  Wear shoes at all times, even in the house. Do not go barefoot. Bare feet are easily injured.  Check your feet daily for blisters, cuts, and redness. If you cannot see the bottom of your feet, use a mirror or ask someone for help.  Wash your feet with warm water (do not use hot water) and mild soap. Then pat your feet and the areas between your toes until they are completely dry. Do not soak your feet as this can dry your skin.  Apply a moisturizing lotion or petroleum jelly (that does not contain alcohol and is unscented) to the skin on your feet and to dry, brittle toenails. Do not apply lotion between your toes.  Trim your toenails straight across. Do not dig under them or around the cuticle. File the edges of your nails with an emery board or nail file.  Do not cut corns or calluses or try to remove them with medicine.  Wear clean socks or stockings every day. Make sure they are not too tight. Do not wear knee-high stockings since they may decrease blood flow to your legs.  Wear shoes that fit properly and have enough cushioning. To break in new shoes, wear them for just a few hours a day. This prevents you from injuring your feet. Always look in your shoes before you put them on to be sure there are no objects inside.  Do not cross your legs. This may decrease the blood flow to your feet.  If you find a  minor scrape, cut, or break in the skin on your feet, keep it and the skin around it clean and dry. These areas may be cleansed with mild soap and water. Do not cleanse the area with peroxide, alcohol, or iodine.  When you remove an adhesive bandage, be sure not to damage the skin around it.  If you have a wound, look at it several times a day to make sure it is healing.  Do not use heating pads or hot water bottles. They may burn your skin. If you have lost feeling in your feet or legs, you may not know it is happening until it is too late.  Make sure your health care provider performs a complete foot exam at least annually or more often if you have foot problems. Report any cuts, sores, or bruises to your health care provider immediately. Contact a health care provider if:  You have an injury that is not healing.  You have cuts or breaks in the skin.  You have an ingrown nail.  You notice redness on your legs or feet.  You feel burning or tingling in your legs or feet.  You have pain or cramps in your legs and feet.  Your legs or feet are numb.  Your feet always feel cold. Get help right away if:  There is increasing   redness, swelling, or pain in or around a wound.  There is a red line that goes up your leg.  Pus is coming from a wound.  You develop a fever or as directed by your health care provider.  You notice a bad smell coming from an ulcer or wound. This information is not intended to replace advice given to you by your health care provider. Make sure you discuss any questions you have with your health care provider. Document Released: 04/07/2000 Document Revised: 09/16/2015 Document Reviewed: 09/17/2012 Elsevier Interactive Patient Education  2017 [place]. Diabetes Mellitus and Exercise Exercising regularly is important for your overall health, especially when you have diabetes (diabetes mellitus). Exercising is not only about losing weight. It has many health  benefits, such as increasing muscle strength and bone density and reducing body fat and stress. This leads to improved fitness, flexibility, and endurance, all of which result in better overall health. Exercise has additional benefits for people with diabetes, including:  Reducing appetite.  Helping to lower and control blood glucose.  Lowering blood pressure.  Helping to control amounts of fatty substances (lipids) in the blood, such as cholesterol and triglycerides.  Helping the body to respond better to insulin (improving insulin sensitivity).  Reducing how much insulin the body needs.  Decreasing the risk for heart disease by: ? Lowering cholesterol and triglyceride levels. ? Increasing the levels of good cholesterol. ? Lowering blood glucose levels.  What is my activity plan? Your health care provider or certified diabetes educator can help you make a plan for the type and frequency of exercise (activity plan) that works for you. Make sure that you:  Do at least 150 minutes of moderate-intensity or vigorous-intensity exercise each week. This could be brisk walking, biking, or water aerobics. ? Do stretching and strength exercises, such as yoga or weightlifting, at least 2 times a week. ? Spread out your activity over at least 3 days of the week.  Get some form of physical activity every day. ? Do not go more than 2 days in a row without some kind of physical activity. ? Avoid being inactive for more than 90 minutes at a time. Take frequent breaks to walk or stretch.  Choose a type of exercise or activity that you enjoy, and set realistic goals.  Start slowly, and gradually increase the intensity of your exercise over time.  What do I need to know about managing my diabetes?  Check your blood glucose before and after exercising. ? If your blood glucose is higher than 240 mg/dL (13.3 mmol/L) before you exercise, check your urine for ketones. If you have ketones in your urine,  do not exercise until your blood glucose returns to normal.  Know the symptoms of low blood glucose (hypoglycemia) and how to treat it. Your risk for hypoglycemia increases during and after exercise. Common symptoms of hypoglycemia can include: ? Hunger. ? Anxiety. ? Sweating and feeling clammy. ? Confusion. ? Dizziness or feeling light-headed. ? Increased heart rate or palpitations. ? Blurry vision. ? Tingling or numbness around the mouth, lips, or tongue. ? Tremors or shakes. ? Irritability.  Keep a rapid-acting carbohydrate snack available before, during, and after exercise to help prevent or treat hypoglycemia.  Avoid injecting insulin into areas of the body that are going to be exercised. For example, avoid injecting insulin into: ? The arms, when playing tennis. ? The legs, when jogging.  Keep records of your exercise habits. Doing this can help you and  your health care provider adjust your diabetes management plan as needed. Write down: ? Food that you eat before and after you exercise. ? Blood glucose levels before and after you exercise. ? The type and amount of exercise you have done. ? When your insulin is expected to peak, if you use insulin. Avoid exercising at times when your insulin is peaking.  When you start a new exercise or activity, work with your health care provider to make sure the activity is safe for you, and to adjust your insulin, medicines, or food intake as needed.  Drink plenty of water while you exercise to prevent dehydration or heat stroke. Drink enough fluid to keep your urine clear or pale yellow. This information is not intended to replace advice given to you by your health care provider. Make sure you discuss any questions you have with your health care provider. Document Released: 07/01/2003 Document Revised: 10/29/2015 Document Reviewed: 09/20/2015 Elsevier Interactive Patient Education  2018 [employer].

## 2017-05-03 LAB — CMP14+EGFR
ALBUMIN: 4.5 g/dL (ref 3.6–4.8)
ALK PHOS: 93 IU/L (ref 39–117)
ALT: 15 IU/L (ref 0–32)
AST: 18 IU/L (ref 0–40)
Albumin/Globulin Ratio: 1.5 (ref 1.2–2.2)
BUN / CREAT RATIO: 16 (ref 12–28)
BUN: 9 mg/dL (ref 8–27)
Bilirubin Total: 0.4 mg/dL (ref 0.0–1.2)
CO2: 25 mmol/L (ref 20–29)
Calcium: 9.7 mg/dL (ref 8.7–10.3)
Chloride: 98 mmol/L (ref 96–106)
Creatinine, Ser: 0.57 mg/dL (ref 0.57–1.00)
GFR calc Af Amer: 114 mL/min/{1.73_m2} (ref >59)
GFR calc non Af Amer: 99 mL/min/{1.73_m2} (ref >59)
GLUCOSE: 98 mg/dL (ref 65–99)
Globulin, Total: 3 g/dL (ref 1.5–4.5)
Potassium: 3.6 mmol/L (ref 3.5–5.2)
Sodium: 138 mmol/L (ref 134–144)
Total Protein: 7.5 g/dL (ref 6.0–8.5)

## 2017-05-03 LAB — LIPID PANEL
CHOLESTEROL TOTAL: 125 mg/dL (ref 100–199)
Chol/HDL Ratio: 3.2 ratio (ref 0.0–4.4)
HDL: 39 mg/dL — AB (ref >39)
LDL Calculated: 54 mg/dL (ref 0–99)
TRIGLYCERIDES: 159 mg/dL — AB (ref 0–149)
VLDL CHOLESTEROL CAL: 32 mg/dL (ref 5–40)

## 2017-05-04 ENCOUNTER — Telehealth: Payer: Self-pay | Admitting: *Deleted

## 2017-05-04 NOTE — Telephone Encounter (Signed)
Medical Assistant left message on patient's home and cell voicemail. Voicemail states to give a call back to [place] with [employer] at [phone]. Patient is aware of cholesterol showing significant improvement and to continue with current medications.

## 2017-05-04 NOTE — Telephone Encounter (Signed)
-----   Message from Tresa Garter, MD sent at 05/04/2017 12:42 PM EST ----- Normal results. Cholesterol level has improved significantly

## 2017-07-02 MED FILL — ?ATORVASTATIN 40MG TABLET: 40 | 30 days supply | Qty: 30 | Fill #5

## 2017-07-02 MED FILL — ?METFORMIN HCL 1,000 MG TAB: 1000 | 30 days supply | Qty: 60 | Fill #5

## 2017-07-31 ENCOUNTER — Telehealth: Payer: Self-pay

## 2017-07-31 NOTE — Telephone Encounter (Signed)
Patient's daughter Ellen Ryan called stated that her mother has been experiencing pain and burning in the vulvar area bilaterally.  Onset 2 weeks.  Pt using tylenol alt with ibuprofen with minimal effect.  She has a lot of burning with uriation. Suggested that she use a squeeze bottle of water to dilute the urine to help decrease burning. Daughter would like an appointment with Dr. Denman George to have her mother reevaluated for recurrence.

## 2017-08-01 ENCOUNTER — Telehealth: Payer: Self-pay | Admitting: *Deleted

## 2017-08-01 NOTE — Telephone Encounter (Signed)
Called and spoke with the patient's daughter. Gave the appt date/time of April 23rd at 4pm

## 2017-08-06 MED FILL — AMLODIPINE BESYLATE 10 MG T: 10 | 30 days supply | Qty: 30 | Fill #1

## 2017-08-06 MED FILL — ?METFORMIN HCL 1,000 MG TAB: 1000 | 30 days supply | Qty: 60 | Fill #6

## 2017-08-06 MED FILL — ?ATORVASTATIN 40MG TABLET: 40 | 30 days supply | Qty: 30 | Fill #6

## 2017-08-08 NOTE — Telephone Encounter (Signed)
Pt to see Dr. Denman George on 08-14-17 for evaluation.Ellen Ryan

## 2017-08-14 ENCOUNTER — Inpatient Hospital Stay: Payer: Self-pay | Attending: Gynecologic Oncology | Admitting: Gynecologic Oncology

## 2017-08-14 ENCOUNTER — Encounter: Payer: Self-pay | Admitting: Gynecologic Oncology

## 2017-08-14 VITALS — BP 149/81 | HR 100 | Temp 98.5°F | Resp 20 | Ht 60.0 in | Wt 172.0 lb

## 2017-08-14 DIAGNOSIS — D071 Carcinoma in situ of vulva: Secondary | ICD-10-CM | POA: Insufficient documentation

## 2017-08-14 DIAGNOSIS — C519 Malignant neoplasm of vulva, unspecified: Secondary | ICD-10-CM | POA: Insufficient documentation

## 2017-08-14 NOTE — Patient Instructions (Signed)
Plan for a exam under anesthesia, possible vulvar biopsy at the [facility] on Sep 13, 2017.  You will receive a phone call from the pre-surgical RN to discuss instructions.  Please call for any questions or concerns.

## 2017-08-14 NOTE — H&P (View-Only) (Signed)
Follow Up Note: Gyn-Onc  Ellen Ryan 62 y.o. female  CC:  Chief Complaint  Patient presents with  . 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  She has resumption of some vulvar burning symptoms concerning for recurrence. I cannot examine her in the office due to discomfort.  We will take Faten to the OR for exam under anesthesia and possible vulvar biopsies. Discussed this course of care with her daughter and patient. Discussed that alternative would be expectant management.   HPI:   Ellen Ryan is 62 year old female initially seen at the request of [name] for extramammary pagets disease.  The patient is visiting her daughter now and lives in [place]. She reported a 2 year history of vulvar ulceration. She was seen by [name] for this problem in April, 2018 and was taken to the OR for an examination under anesthesia on 09/01/16. Extensive ulceration was present. 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    On 09/21/16, she underwent a complete simple vulvectomy.  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 No invasive carcinoma, negative margins. Post-operative course was uneventful and she was discharged with a foley catheter for one week post-op.  Interval History:  She presents today to the office with her daughter for scheduled follow-up.  She has urinary urgency not resolved by medications. She feels her urine deviates in stream. Since early April, 2019 she developed irritation of the vulva made better with silvadine cream   Review of Systems Constitutional: Feels well.  No fever, chills, early satiety, change in appetite.   Cardiovascular: No chest pain, shortness of breath, or edema.  Pulmonary: No cough or wheeze.  Gastrointestinal: No nausea, vomiting, or diarrhea. No bright red blood per rectum or change in bowel  movement.  Genitourinary: No frequency, urgency, or dysuria. No vaginal bleeding or discharge.  Musculoskeletal: No myalgia or joint pain Neurologic: No weakness, numbness, or change in gait.  Psychology: No depression, anxiety, or insomnia  Current Meds:  Outpatient Encounter Medications as of 08/14/2017  Medication Sig  . amLODipine (NORVASC) 10 MG tablet Take 10 mg by mouth daily.  Marland Kitchen atorvastatin (LIPITOR) 40 MG tablet Take 1 tablet (40 mg total) by mouth daily.  . metFORMIN (GLUCOPHAGE) 500 MG tablet Take 1 tablet (500 mg total) by mouth 2 (two) times daily with a meal.  . Multiple Vitamin (MULTIVITAMIN WITH MINERALS) TABS tablet Take 1 tablet by mouth daily. Centrum  . Blood Glucose Monitoring Suppl (TRUE METRIX METER) w/Device KIT Use as directed (Patient not taking: Reported on 08/14/2017)  . glucose blood (TRUE METRIX BLOOD GLUCOSE TEST) test strip Use as instructed (Patient not taking: Reported on 08/14/2017)  . Omega-3 Fatty Acids (FISH OIL PO) Take 1 capsule by mouth daily.  Marland Kitchen senna-docusate (SENOKOT-S) 8.6-50 MG tablet Take 2 tablets by mouth at bedtime. (Patient not taking: Reported on 08/14/2017)  . TRUEPLUS LANCETS 28G MISC Check sugar three times a day (Patient not taking: Reported on 08/14/2017)  . [DISCONTINUED] lisinopril (PRINIVIL,ZESTRIL) 20 MG tablet Take 0.5 tablets (10 mg total) by mouth daily.   No facility-administered encounter medications on file as of 08/14/2017.     Allergy: No Known Allergies  Social Hx:   Social History   Socioeconomic History  . Marital status: Widowed    Spouse name: Not on file  . Number of children:  Not on file  . Years of education: Not on file  . Highest education level: Not on file  Occupational History  . Not on file  Social Needs  . Financial resource strain: Not on file  . Food insecurity:    Worry: Not on file    Inability: Not on file  . Transportation needs:    Medical: Not on file    Non-medical: Not on file  Tobacco  Use  . Smoking status: Never Smoker  . Smokeless tobacco: Never Used  Substance and Sexual Activity  . Alcohol use: Yes    Comment: OCCASIONALLY   . Drug use: No  . Sexual activity: Not on file  Lifestyle  . Physical activity:    Days per week: Not on file    Minutes per session: Not on file  . Stress: Not on file  Relationships  . Social connections:    Talks on phone: Not on file    Gets together: Not on file    Attends religious service: Not on file    Active member of club or organization: Not on file    Attends meetings of clubs or organizations: Not on file    Relationship status: Not on file  . Intimate partner violence:    Fear of current or ex partner: Not on file    Emotionally abused: Not on file    Physically abused: Not on file    Forced sexual activity: Not on file  Other Topics Concern  . Not on file  Social History Narrative  . Not on file    Past Surgical Hx:  Past Surgical History:  Procedure Laterality Date  . COLPOSCOPY VULVA W/ BIOPSY    . RADICAL VULVECTOMY N/A 09/21/2016   Procedure: RADICAL VULVECTOMY COMPLETE;  Surgeon: Everitt Amber, MD;  Location: [facility];  Service: Gynecology;  Laterality: N/A;    Past Medical Hx:  Past Medical History:  Diagnosis Date  . Arthritis    both knees  . Diabetes mellitus, new onset ([place]) 12/27/2016  . Hyperlipidemia   . Hypertension     Family Hx: History reviewed. No pertinent family history.  Vitals:  Blood pressure (!) 149/81, pulse 100, temperature 98.5 F (36.9 C), temperature source Oral, resp. rate 20, height 5' (1.524 m), weight 172 lb (78 kg), SpO2 99 %.  Physical Exam:  General: Well developed, well nourished female in no acute distress. Alert and oriented x 3.  Cardiovascular: Regular rate and rhythm. S1 and S2 normal.  Lungs: Clear to auscultation bilaterally. No wheezes/crackles/rhonchi noted.  Skin: No rashes or lesions present. Back: No CVA tenderness.  Abdomen: Abdomen soft, non-tender and  obese. Active bowel sounds in all quadrants. No evidence of a fluid wave or abdominal masses.  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 There are no gross lesions on labia majora. The patient does not tolerate a pelvic exam adequate to visualize the labia minora and introitus due to discomfort and closing of her thighs.  Extremities: No bilateral cyanosis, edema, or clubbing.   Thereasa Solo, MD 08/14/2017, 7:53 PM

## 2017-08-14 NOTE — Progress Notes (Signed)
Follow Up Note: Gyn-Onc  [name] 62 y.o. female  CC:  Chief Complaint  Patient presents with  . 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  She has resumption of some vulvar burning symptoms concerning for recurrence. I cannot examine her in the office due to discomfort.  We will take Blaize to the OR for exam under anesthesia and possible vulvar biopsies. Discussed this course of care with her daughter and patient. Discussed that alternative would be expectant management.   HPI:   Ellen Ryan is 62 year old female initially seen at the request of Dr Dove for extramammary pagets disease.  The patient is visiting her daughter now and lives in [place]. She reported a 2 year history of vulvar ulceration. She was seen by Dr Dove for this problem in April, 2018 and was taken to the OR for an examination under anesthesia on 09/01/16. Extensive ulceration was present. 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    On 09/21/16, she underwent a complete simple vulvectomy.  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 No invasive carcinoma, negative margins. Post-operative course was uneventful and she was discharged with a foley catheter for one week post-op.  Interval History:  She presents today to the office with her daughter for scheduled follow-up.  She has urinary urgency not resolved by medications. She feels her urine deviates in stream. Since early April, 2019 she developed irritation of the vulva made better with silvadine cream   Review of Systems Constitutional: Feels well.  No fever, chills, early satiety, change in appetite.   Cardiovascular: No chest pain, shortness of breath, or edema.  Pulmonary: No cough or wheeze.  Gastrointestinal: No nausea, vomiting, or diarrhea. No bright red blood per rectum or change in bowel  movement.  Genitourinary: No frequency, urgency, or dysuria. No vaginal bleeding or discharge.  Musculoskeletal: No myalgia or joint pain Neurologic: No weakness, numbness, or change in gait.  Psychology: No depression, anxiety, or insomnia  Current Meds:  Outpatient Encounter Medications as of 08/14/2017  Medication Sig  . amLODipine (NORVASC) 10 MG tablet Take 10 mg by mouth daily.  . atorvastatin (LIPITOR) 40 MG tablet Take 1 tablet (40 mg total) by mouth daily.  . metFORMIN (GLUCOPHAGE) 500 MG tablet Take 1 tablet (500 mg total) by mouth 2 (two) times daily with a meal.  . Multiple Vitamin (MULTIVITAMIN WITH MINERALS) TABS tablet Take 1 tablet by mouth daily. Centrum  . Blood Glucose Monitoring Suppl (TRUE METRIX METER) w/Device KIT Use as directed (Patient not taking: Reported on 08/14/2017)  . glucose blood (TRUE METRIX BLOOD GLUCOSE TEST) test strip Use as instructed (Patient not taking: Reported on 08/14/2017)  . Omega-3 Fatty Acids (FISH OIL PO) Take 1 capsule by mouth daily.  . senna-docusate (SENOKOT-S) 8.6-50 MG tablet Take 2 tablets by mouth at bedtime. (Patient not taking: Reported on 08/14/2017)  . TRUEPLUS LANCETS 28G MISC Check sugar three times a day (Patient not taking: Reported on 08/14/2017)  . [DISCONTINUED] lisinopril (PRINIVIL,ZESTRIL) 20 MG tablet Take 0.5 tablets (10 mg total) by mouth daily.   No facility-administered encounter medications on file as of 08/14/2017.     Allergy: No Known Allergies  Social Hx:   Social History   Socioeconomic History  . Marital status: Widowed    Spouse name: Not on file  . Number of children:   Not on file  . Years of education: Not on file  . Highest education level: Not on file  Occupational History  . Not on file  Social Needs  . Financial resource strain: Not on file  . Food insecurity:    Worry: Not on file    Inability: Not on file  . Transportation needs:    Medical: Not on file    Non-medical: Not on file  Tobacco  Use  . Smoking status: Never Smoker  . Smokeless tobacco: Never Used  Substance and Sexual Activity  . Alcohol use: Yes    Comment: OCCASIONALLY   . Drug use: No  . Sexual activity: Not on file  Lifestyle  . Physical activity:    Days per week: Not on file    Minutes per session: Not on file  . Stress: Not on file  Relationships  . Social connections:    Talks on phone: Not on file    Gets together: Not on file    Attends religious service: Not on file    Active member of club or organization: Not on file    Attends meetings of clubs or organizations: Not on file    Relationship status: Not on file  . Intimate partner violence:    Fear of current or ex partner: Not on file    Emotionally abused: Not on file    Physically abused: Not on file    Forced sexual activity: Not on file  Other Topics Concern  . Not on file  Social History Narrative  . Not on file    Past Surgical Hx:  Past Surgical History:  Procedure Laterality Date  . COLPOSCOPY VULVA W/ BIOPSY    . RADICAL VULVECTOMY N/A 09/21/2016   Procedure: RADICAL VULVECTOMY COMPLETE;  Surgeon: Everitt Amber, MD;  Location: [facility];  Service: Gynecology;  Laterality: N/A;    Past Medical Hx:  Past Medical History:  Diagnosis Date  . Arthritis    both knees  . Diabetes mellitus, new onset ([place]) 12/27/2016  . Hyperlipidemia   . Hypertension     Family Hx: History reviewed. No pertinent family history.  Vitals:  Blood pressure (!) 149/81, pulse 100, temperature 98.5 F (36.9 C), temperature source Oral, resp. rate 20, height 5' (1.524 m), weight 172 lb (78 kg), SpO2 99 %.  Physical Exam:  General: Well developed, well nourished female in no acute distress. Alert and oriented x 3.  Cardiovascular: Regular rate and rhythm. S1 and S2 normal.  Lungs: Clear to auscultation bilaterally. No wheezes/crackles/rhonchi noted.  Skin: No rashes or lesions present. Back: No CVA tenderness.  Abdomen: Abdomen soft, non-tender and  obese. Active bowel sounds in all quadrants. No evidence of a fluid wave or abdominal masses.  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 There are no gross lesions on labia majora. The patient does not tolerate a pelvic exam adequate to visualize the labia minora and introitus due to discomfort and closing of her thighs.  Extremities: No bilateral cyanosis, edema, or clubbing.   Thereasa Solo, MD 08/14/2017, 7:53 PM

## 2017-08-20 ENCOUNTER — Telehealth: Payer: Self-pay | Admitting: *Deleted

## 2017-08-20 NOTE — Telephone Encounter (Signed)
Called and spoke with the patient's daughter, gave post op appt for June 12th at 3:45pm

## 2017-09-05 MED FILL — AMLODIPINE BESYLATE 10 MG T: 10 | 30 days supply | Qty: 30 | Fill #2

## 2017-09-07 ENCOUNTER — Other Ambulatory Visit: Payer: Self-pay

## 2017-09-07 ENCOUNTER — Encounter (HOSPITAL_BASED_OUTPATIENT_CLINIC_OR_DEPARTMENT_OTHER): Payer: Self-pay | Admitting: *Deleted

## 2017-09-07 NOTE — Progress Notes (Signed)
Spoke with patient daughter Jari Favre and patient primary language is igbo. Per daughter patient speaks english well and does not need interpreter day of surgery 09-13-17. Patient daughter did medical history over phone and instructed :  Patient npo after midnight, arrive 1030 am 09-13-17 [facility]  meds to take sip of water amlodipine, tylenol prn Driver daughter Echo 925-18 epic/chart Chest xray 12-28-16 epic/chart Needs I stat 4 and ekg  And cbg Have pre op orders.

## 2017-09-12 NOTE — Anesthesia Preprocedure Evaluation (Addendum)
Anesthesia Evaluation  Patient identified by MRN, date of birth, ID band Patient awake    Reviewed: Allergy & Precautions, NPO status , Patient's Chart, lab work & pertinent test results  Airway Mallampati: III  TM Distance: >3 FB Neck ROM: Full    Dental  (+) Dental Advisory Given, Teeth Intact   Pulmonary neg pulmonary ROS,    Pulmonary exam normal breath sounds clear to auscultation       Cardiovascular Exercise Tolerance: Good hypertension, Normal cardiovascular exam Rhythm:Regular Rate:Normal     Neuro/Psych negative neurological ROS  negative psych ROS   GI/Hepatic negative GI ROS, Neg liver ROS,   Endo/Other  diabetes, Type 2  Renal/GU negative Renal ROS  negative genitourinary   Musculoskeletal   Abdominal (+) + obese,   Peds  Hematology negative hematology ROS (+)   Anesthesia Other Findings   Reproductive/Obstetrics                            Lab Results  Component Value Date   CREATININE 0.57 05/02/2017   BUN 9 05/02/2017   NA 138 05/02/2017   K 3.6 05/02/2017   CL 98 05/02/2017   CO2 25 05/02/2017   Lab Results  Component Value Date   WBC 8.5 12/27/2016   HGB 12.3 12/27/2016   HCT 41.4 12/27/2016   MCV 73 (L) 12/27/2016   PLT 259 12/27/2016     Anesthesia Physical Anesthesia Plan  ASA: III  Anesthesia Plan: General   Post-op Pain Management:    Induction: Intravenous  PONV Risk Score and Plan: 4 or greater and Treatment may vary due to age or medical condition, Ondansetron and Dexamethasone  Airway Management Planned: LMA  Additional Equipment:   Intra-op Plan:   Post-operative Plan: Extubation in OR  Informed Consent: I have reviewed the patients History and Physical, chart, labs and discussed the procedure including the risks, benefits and alternatives for the proposed anesthesia with the patient or authorized representative who has indicated  his/her understanding and acceptance.   Dental advisory given  Plan Discussed with: CRNA  Anesthesia Plan Comments:        Anesthesia Quick Evaluation

## 2017-09-13 ENCOUNTER — Ambulatory Visit (HOSPITAL_BASED_OUTPATIENT_CLINIC_OR_DEPARTMENT_OTHER): Payer: Self-pay | Admitting: Anesthesiology

## 2017-09-13 ENCOUNTER — Other Ambulatory Visit: Payer: Self-pay

## 2017-09-13 ENCOUNTER — Ambulatory Visit (HOSPITAL_BASED_OUTPATIENT_CLINIC_OR_DEPARTMENT_OTHER)
Admission: RE | Admit: 2017-09-13 | Discharge: 2017-09-13 | Disposition: A | Discharge status: Home / Self Care | Payer: Self-pay | Source: Ambulatory Visit | Attending: Gynecologic Oncology | Admitting: Gynecologic Oncology

## 2017-09-13 ENCOUNTER — Encounter (HOSPITAL_BASED_OUTPATIENT_CLINIC_OR_DEPARTMENT_OTHER)
Admission: RE | Disposition: A | Discharge status: Home / Self Care | Payer: Self-pay | Source: Ambulatory Visit | Attending: Gynecologic Oncology

## 2017-09-13 ENCOUNTER — Encounter (HOSPITAL_BASED_OUTPATIENT_CLINIC_OR_DEPARTMENT_OTHER): Payer: Self-pay | Admitting: *Deleted

## 2017-09-13 DIAGNOSIS — E669 Obesity, unspecified: Secondary | ICD-10-CM | POA: Insufficient documentation

## 2017-09-13 DIAGNOSIS — E785 Hyperlipidemia, unspecified: Secondary | ICD-10-CM | POA: Insufficient documentation

## 2017-09-13 DIAGNOSIS — Z7984 Long term (current) use of oral hypoglycemic drugs: Secondary | ICD-10-CM | POA: Insufficient documentation

## 2017-09-13 DIAGNOSIS — C4499 Other specified malignant neoplasm of skin, unspecified: Secondary | ICD-10-CM | POA: Insufficient documentation

## 2017-09-13 DIAGNOSIS — D071 Carcinoma in situ of vulva: Secondary | ICD-10-CM | POA: Insufficient documentation

## 2017-09-13 DIAGNOSIS — Z79899 Other long term (current) drug therapy: Secondary | ICD-10-CM | POA: Insufficient documentation

## 2017-09-13 DIAGNOSIS — I10 Essential (primary) hypertension: Secondary | ICD-10-CM | POA: Insufficient documentation

## 2017-09-13 DIAGNOSIS — Z6833 Body mass index (BMI) 33.0-33.9, adult: Secondary | ICD-10-CM | POA: Insufficient documentation

## 2017-09-13 DIAGNOSIS — M170 Bilateral primary osteoarthritis of knee: Secondary | ICD-10-CM | POA: Insufficient documentation

## 2017-09-13 DIAGNOSIS — E119 Type 2 diabetes mellitus without complications: Secondary | ICD-10-CM | POA: Insufficient documentation

## 2017-09-13 HISTORY — PX: VULVA /PERINEUM BIOPSY: SHX319

## 2017-09-13 LAB — POCT I-STAT 4, (NA,K, GLUC, HGB,HCT)
Glucose, Bld: 117 mg/dL — ABNORMAL HIGH (ref 65–99)
HEMATOCRIT: 39 % (ref 36.0–46.0)
Hemoglobin: 13.3 g/dL (ref 12.0–15.0)
Potassium: 3.7 mmol/L (ref 3.5–5.1)
Sodium: 143 mmol/L (ref 135–145)

## 2017-09-13 LAB — GLUCOSE, CAPILLARY: GLUCOSE-CAPILLARY: 96 mg/dL (ref 65–99)

## 2017-09-13 SURGERY — EXAM UNDER ANESTHESIA
Anesthesia: General | Site: Vulva

## 2017-09-13 MED ORDER — ACETAMINOPHEN 500 MG PO TABS
1000.0000 mg | ORAL_TABLET | Freq: Once | ORAL | Status: DC
  Filled 2017-09-13: qty 2

## 2017-09-13 MED ORDER — ONDANSETRON HCL 4 MG/2ML IJ SOLN
INTRAMUSCULAR | Status: AC
  Filled 2017-09-13: qty 2

## 2017-09-13 MED ORDER — MIDAZOLAM HCL 2 MG/2ML IJ SOLN
INTRAMUSCULAR | Status: AC
  Filled 2017-09-13: qty 2

## 2017-09-13 MED ORDER — OXYCODONE-ACETAMINOPHEN 5-325 MG PO TABS: 1.0000 | ORAL_TABLET | ORAL | 0 refills | Status: DC | PRN

## 2017-09-13 MED ORDER — PROMETHAZINE HCL 25 MG/ML IJ SOLN
6.2500 mg | INTRAMUSCULAR | Status: DC | PRN
  Filled 2017-09-13: qty 1

## 2017-09-13 MED ORDER — ONDANSETRON HCL 4 MG/2ML IJ SOLN
INTRAMUSCULAR | Status: DC | PRN
  Administered 2017-09-13: 4 mg via INTRAVENOUS

## 2017-09-13 MED ORDER — PROPOFOL 10 MG/ML IV BOLUS
INTRAVENOUS | Status: AC
Start: 2017-09-13 — End: ?
  Filled 2017-09-13: qty 20

## 2017-09-13 MED ORDER — MIDAZOLAM HCL 5 MG/5ML IJ SOLN
INTRAMUSCULAR | Status: DC | PRN
  Administered 2017-09-13: 1 mg via INTRAVENOUS

## 2017-09-13 MED ORDER — HYDROCODONE-ACETAMINOPHEN 7.5-325 MG PO TABS
1.0000 | ORAL_TABLET | Freq: Once | ORAL | Status: DC | PRN
  Filled 2017-09-13: qty 1

## 2017-09-13 MED ORDER — LIDOCAINE 2% (20 MG/ML) 5 ML SYRINGE
INTRAMUSCULAR | Status: DC | PRN
  Administered 2017-09-13: 50 mg via INTRAVENOUS

## 2017-09-13 MED ORDER — FENTANYL CITRATE (PF) 100 MCG/2ML IJ SOLN
INTRAMUSCULAR | Status: AC
  Filled 2017-09-13: qty 2

## 2017-09-13 MED ORDER — FENTANYL CITRATE (PF) 100 MCG/2ML IJ SOLN
INTRAMUSCULAR | Status: DC | PRN
  Administered 2017-09-13: 25 ug via INTRAVENOUS

## 2017-09-13 MED ORDER — MEPERIDINE HCL 25 MG/ML IJ SOLN
6.2500 mg | INTRAMUSCULAR | Status: DC | PRN
  Filled 2017-09-13: qty 1

## 2017-09-13 MED ORDER — DEXAMETHASONE SODIUM PHOSPHATE 10 MG/ML IJ SOLN
INTRAMUSCULAR | Status: DC | PRN
  Administered 2017-09-13: 10 mg via INTRAVENOUS

## 2017-09-13 MED ORDER — HYDROMORPHONE HCL 1 MG/ML IJ SOLN
0.2500 mg | INTRAMUSCULAR | Status: DC | PRN
  Administered 2017-09-13: 0.25 mg via INTRAVENOUS
  Filled 2017-09-13: qty 0.5

## 2017-09-13 MED ORDER — DEXAMETHASONE SODIUM PHOSPHATE 10 MG/ML IJ SOLN
INTRAMUSCULAR | Status: AC
  Filled 2017-09-13: qty 1

## 2017-09-13 MED ORDER — SENNA 8.6 MG PO TABS: 1.0000 | ORAL_TABLET | Freq: Every evening | ORAL | 0 refills | Status: DC | PRN

## 2017-09-13 MED ORDER — PROPOFOL 10 MG/ML IV BOLUS
INTRAVENOUS | Status: DC | PRN
  Administered 2017-09-13: 40 mg via INTRAVENOUS
  Administered 2017-09-13: 160 mg via INTRAVENOUS

## 2017-09-13 MED ORDER — ACETAMINOPHEN 10 MG/ML IV SOLN
1000.0000 mg | Freq: Once | INTRAVENOUS | Status: DC | PRN
  Filled 2017-09-13: qty 100

## 2017-09-13 MED ORDER — HYDROMORPHONE HCL 1 MG/ML IJ SOLN
INTRAMUSCULAR | Status: AC
  Filled 2017-09-13: qty 1

## 2017-09-13 MED ORDER — LIDOCAINE HCL 1 % IJ SOLN
INTRAMUSCULAR | Status: DC | PRN
  Administered 2017-09-13: 10 mL

## 2017-09-13 MED ORDER — LACTATED RINGERS IV SOLN
INTRAVENOUS | Status: DC
  Administered 2017-09-13 (×2): via INTRAVENOUS
  Filled 2017-09-13: qty 1000

## 2017-09-13 SURGICAL SUPPLY — 26 items
BLADE CLIPPER SURG (BLADE) IMPLANT
BLADE SURG 15 STRL LF DISP TIS (BLADE) ×2 IMPLANT
BLADE SURG 15 STRL SS (BLADE) ×1
CANISTER SUCT 3000ML PPV (MISCELLANEOUS) ×3 IMPLANT
CATH ROBINSON RED A/P 14FR (CATHETERS) ×3 IMPLANT
GLOVE BIO SURGEON STRL SZ 6 (GLOVE) ×6 IMPLANT
GLOVE BIO SURGEON STRL SZ 6.5 (GLOVE) ×3 IMPLANT
GLOVE BIOGEL PI IND STRL 6.5 (GLOVE) ×2 IMPLANT
GLOVE BIOGEL PI IND STRL 7.5 (GLOVE) ×2 IMPLANT
GLOVE BIOGEL PI INDICATOR 6.5 (GLOVE) ×1
GLOVE BIOGEL PI INDICATOR 7.5 (GLOVE) ×1
KIT TURNOVER CYSTO (KITS) ×3 IMPLANT
NEEDLE HYPO 25X1 1.5 SAFETY (NEEDLE) ×3 IMPLANT
NS IRRIG 500ML POUR BTL (IV SOLUTION) ×3 IMPLANT
PACK PERINEAL COLD (PAD) ×3 IMPLANT
PACK VAGINAL WOMENS (CUSTOM PROCEDURE TRAY) ×3 IMPLANT
SUT VIC AB 0 SH 27 (SUTURE) ×3 IMPLANT
SUT VIC AB 2-0 CT2 27 (SUTURE) IMPLANT
SUT VIC AB 2-0 SH 27 (SUTURE)
SUT VIC AB 2-0 SH 27XBRD (SUTURE) IMPLANT
SUT VIC AB 3-0 SH 27 (SUTURE) ×1
SUT VIC AB 3-0 SH 27X BRD (SUTURE) ×2 IMPLANT
SUT VICRYL 4-0 PS2 18IN ABS (SUTURE) ×3 IMPLANT
SYR BULB IRRIGATION 50ML (SYRINGE) ×3 IMPLANT
TOWEL OR 17X24 6PK STRL BLUE (TOWEL DISPOSABLE) ×6 IMPLANT
WATER STERILE IRR 500ML POUR (IV SOLUTION) IMPLANT

## 2017-09-13 NOTE — Interval H&P Note (Signed)
History and Physical Interval Note:  09/13/2017 2:00 PM  Ellen Ryan  has presented today for surgery, with the diagnosis of vulvodynia VIN III  The various methods of treatment have been discussed with the patient and family. After consideration of risks, benefits and other options for treatment, the patient has consented to  Procedure(s): EXAM UNDER ANESTHESIA (N/A) VULVAR BIOPSY (N/A) as a surgical intervention .  The patient's history has been reviewed, patient examined, no change in status, stable for surgery.  I have reviewed the patient's chart and labs.  Questions were answered to the patient's satisfaction.     Thereasa Solo

## 2017-09-13 NOTE — Op Note (Signed)
PATIENT: Ellen Ryan ENCOUNTER DATE: 09/13/17   Preop Diagnosis: history of VIN3, pagets disease, new vulvar irritation, inability to tolerate office exam  Postoperative Diagnosis: same  Surgery: examination under anesthesia and vulvar biopsies  Surgeons:  Donaciano Eva, MD Assistant: none  Anesthesia: General   Estimated blood loss: minimal   IVF:  152ml   Urine output: 177 ml   Complications: None   Pathology: right periurethral biopsy, midline vaginal introitus   Operative findings: s/p female circumcision. S/p complete vulvectomy. Subtle acetowhite changes immediately lateral to urethral meatus. Subtle acetowhite changes to midline distal vagina (otherwise completely agglutinated).   Procedure: The patient was identified in the preoperative holding area. Informed consent was signed on the chart. Patient was seen history was reviewed and exam was performed.   The patient was then taken to the operating room and placed in the supine position with SCD hose on. General anesthesia was then induced without difficulty. She was then placed in the dorsolithotomy position. The perineum was prepped with Betadine. The vagina was prepped with Betadine. The patient was then draped after the prep was dried. A Foley catheter was inserted into the bladder under sterile conditions and removed after straight cath.  Timeout was performed the patient, procedure, antibiotic, allergy, and length of procedure. 5% acetic acid solution was applied to the perineum. The vulvar tissues were inspected for areas of acetowhite changes or leukoplakia. The lesions were identified and the subcuticular tissues were infiltrated with 1% lidocaine. The 15 blade scalpel was used to make an incision to excise tissue at the right periurethral and immediately inferior to the urethra at the midline vaginal introtus and the apex of the agglutinated vagina. Hemostasis was achieved with the bovie.   All instrument,  suture, laparotomy, Ray-Tec, and needle counts were correct x2. The patient tolerated the procedure well and was taken recovery room in stable condition. This is Everitt Amber dictating an operative note on Lucky Hollenberg.  Thereasa Solo, MD

## 2017-09-13 NOTE — Anesthesia Procedure Notes (Signed)
Procedure Name: LMA Insertion Date/Time: 09/13/2017 2:12 PM Performed by: Bonney Aid, CRNA Pre-anesthesia Checklist: Patient identified, Emergency Drugs available, Suction available and Patient being monitored Patient Re-evaluated:Patient Re-evaluated prior to induction Oxygen Delivery Method: Circle system utilized Preoxygenation: Pre-oxygenation with 100% oxygen Induction Type: IV induction Ventilation: Mask ventilation without difficulty LMA: LMA inserted LMA Size: 4.0 Number of attempts: 1 Airway Equipment and Method: Bite block Placement Confirmation: positive ETCO2 Tube secured with: Tape Dental Injury: Teeth and Oropharynx as per pre-operative assessment

## 2017-09-13 NOTE — Discharge Instructions (Signed)
Vulvar biopsy, Care After The vulva is the external female genitalia, outside and around the vagina and pubic bone. It consists of:  The skin on, and in front of, the pubic bone.  The clitoris.  The labia majora (large lips) on the outside of the vagina.  The labia minora (small lips) around the opening of the vagina.  The opening and the skin in and around the vagina. A vulvectomy is the removal of the tissue of the vulva, which sometimes includes removal of the lymph nodes and tissue in the groin areas. These discharge instructions provide you with general information on caring for yourself after you leave the hospital. It is also important that you know the warning signs of complications, so that you can seek treatment. Please read the instructions outlined below and refer to this sheet in the next few weeks. Your caregiver may also give you specific information and medicines. If you have any questions or complications after discharge, please call your caregiver. ACTIVITY  No restrictions   LEG AND FOOT CARE  Do not cross your legs, especially when sitting. NUTRITION  You may resume your normal diet.  Drink 6 to 8 glasses of fluids a day.  Eat a healthy, balanced diet including portions of food from the meat (protein), milk, fruit, vegetable, and bread groups.  Your caregiver may recommend you take a multivitamin with iron. ELIMINATION  You may notice that your stream of urine is at a different angle, and may tend to spray. Using a plastic funnel may help to decrease urine spray.  If constipation occurs, drink more liquids, and add more fruits, vegetables, and bran to your diet. You may take a mild laxative, such as Milk of Magnesia, Metamucil, or a stool softener such as Colace, with permission from your caregiver. HYGIENE  You may shower and wash your hair.  Check with your caregiver about tub baths.  Do not add any bath oils or chemicals to your bath water, after you  have permission to take baths.  While passing urine, pour water from a bottle or spray over your vulva to dilute the urine as it passes the incision (this will decrease burning and discomfort).  Clean yourself well after moving your bowels.  After urinating, do not wipe. Dap or pat dry with toilet paper or a dry cleath soft cloth.  A sitz bath will help keep your perineal area clean, reduce swelling, and provide comfort.  HOME CARE INSTRUCTIONS   Apply a soft ice pack (or frozen bag of peas) to your perineum (vulva) every hour in the first 48 hours after surgery. This will reduce swelling.  Avoid activities that involve a lot of friction between your legs.  Avoid wearing pants or underpants in the 1st 2 weeks (skirts are preferable).  Do not drink alcohol while taking pain medicine.  Change your dressing as advised by your caregiver.  You may take over-the-counter medicine for pain, recommended by your caregiver.  If your pain is not relieved with medicine, call your caregiver.  Do not take aspirin because it can cause bleeding.  Do not douche or use tampons (use a nonperfumed sanitary pad).  Do not have sexual intercourse until your caregiver gives you permission (typically 6 weeks postoperatively). Hugging, kissing, and playful sexual activity is fine with your caregiver's permission.  Warm sitz baths, with your caregiver's permission, are helpful to control swelling and discomfort.  You may take a mild medicine for constipation, recommended by your caregiver. Bran foods  and drinking a lot of fluids will help with constipation.  Make sure your family understands everything about your operation and recovery. SEEK MEDICAL CARE IF:   You notice swelling and redness around the wound area.  You notice a foul smell coming from the wound or on the surgical dressing.  You notice the wound is separating.  You have painful or bloody urination.  You develop nausea and  vomiting.  You develop diarrhea.  You develop a rash.  You have a reaction or allergy from the medicine.  You feel dizzy or light-headed.  You need stronger pain medicine. SEEK IMMEDIATE MEDICAL CARE IF:   You develop a temperature of 102 F (38.9 C) or higher.  You pass out.  You develop leg or chest pain.  You develop abdominal pain.  You develop shortness of breath.  You develop bleeding from the wound area.  You see pus in the wound area. MAKE SURE YOU:   Understand these instructions.  Will watch your condition.  Will get help right away if you are not doing well or get worse. Document Released: 11/23/2003 Document Revised: 08/25/2013 Document Reviewed: 03/12/2009 [facility] Patient Information 2015 [place], [place]. This information is not intended to replace advice given to you by your health care provider. Make sure you discuss any questions you have with your health care provider.     Post Anesthesia Home Care Instructions  Activity: Get plenty of rest for the remainder of the day. A responsible individual must stay with you for 24 hours following the procedure.  For the next 24 hours, DO NOT: -Drive a car -Paediatric nurse -Drink alcoholic beverages -Take any medication unless instructed by your physician -Make any legal decisions or sign important papers.  Meals: Start with liquid foods such as gelatin or soup. Progress to regular foods as tolerated. Avoid greasy, spicy, heavy foods. If nausea and/or vomiting occur, drink only clear liquids until the nausea and/or vomiting subsides. Call your physician if vomiting continues.  Special Instructions/Symptoms: Your throat may feel dry or sore from the anesthesia or the breathing tube placed in your throat during surgery. If this causes discomfort, gargle with warm salt water. The discomfort should disappear within 24 hours.  If you had a scopolamine patch placed behind your ear for the management of post-  operative nausea and/or vomiting:  1. The medication in the patch is effective for 72 hours, after which it should be removed.  Wrap patch in a tissue and discard in the trash. Wash hands thoroughly with soap and water. 2. You may remove the patch earlier than 72 hours if you experience unpleasant side effects which may include dry mouth, dizziness or visual disturbances. 3. Avoid touching the patch. Wash your hands with soap and water after contact with the patch.

## 2017-09-13 NOTE — Transfer of Care (Signed)
Immediate Anesthesia Transfer of Care Note  Patient: Ellen Ryan  Procedure(s) Performed: EXAM UNDER ANESTHESIA (N/A Perineum) VULVAR BIOPSY (N/A Vulva)  Patient Location: PACU  Anesthesia Type:General  Level of Consciousness: drowsy  Airway & Oxygen Therapy: Patient Spontanous Breathing and Patient connected to nasal cannula oxygen  Post-op Assessment: Report given to RN  Post vital signs: Reviewed and stable  Last Vitals:  Vitals Value Taken Time  BP 135/85 09/13/2017  2:35 PM  Temp    Pulse 83 09/13/2017  2:36 PM  Resp 21 09/13/2017  2:36 PM  SpO2 96 % 09/13/2017  2:36 PM  Vitals shown include unvalidated device data.  Last Pain:  Vitals:   09/13/17 1107  TempSrc:   PainSc: 0-No pain      Patients Stated Pain Goal: 6 (16/83/72 9021)  Complications: No apparent anesthesia complications

## 2017-09-13 NOTE — Anesthesia Postprocedure Evaluation (Signed)
Anesthesia Post Note  Patient: Ellen Ryan  Procedure(s) Performed: EXAM UNDER ANESTHESIA (N/A Perineum) VULVAR BIOPSY (N/A Vulva)     Patient location during evaluation: PACU Anesthesia Type: General Level of consciousness: sedated and patient cooperative Pain management: pain level controlled Vital Signs Assessment: post-procedure vital signs reviewed and stable Respiratory status: spontaneous breathing Cardiovascular status: stable Anesthetic complications: no    Last Vitals:  Vitals:   09/13/17 1545 09/13/17 1642  BP: 108/62 (!) 146/87  Pulse: 64 75  Resp: 18 12  Temp:  36.8 C  SpO2: 94% 97%    Last Pain:  Vitals:   09/13/17 1642  TempSrc:   PainSc: 0-No pain                 Nolon Nations

## 2017-09-14 ENCOUNTER — Encounter (HOSPITAL_BASED_OUTPATIENT_CLINIC_OR_DEPARTMENT_OTHER): Payer: Self-pay | Admitting: Gynecologic Oncology

## 2017-10-03 ENCOUNTER — Encounter: Payer: Self-pay | Admitting: Gynecologic Oncology

## 2017-10-03 ENCOUNTER — Inpatient Hospital Stay: Payer: Self-pay | Attending: Gynecologic Oncology | Admitting: Gynecologic Oncology

## 2017-10-03 VITALS — BP 145/80 | HR 108 | Temp 97.8°F | Resp 20 | Ht 60.0 in | Wt 173.5 lb

## 2017-10-03 DIAGNOSIS — D071 Carcinoma in situ of vulva: Secondary | ICD-10-CM | POA: Insufficient documentation

## 2017-10-03 NOTE — Progress Notes (Signed)
Follow Up Note: Gyn-Onc  Ellen Ryan 62 y.o. female  CC:  Chief Complaint  Patient presents with  . 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   We will take Chastidy to the OR for CO2 laser of the periurethal area. Discussed this course of care with her daughter and patient. Discussed that there is risk of urethral stricture, however surgical excision associated with higher risk for this. Discussed postop course and recovery. Will keep a foley in for 1 week postop.   HPI:   Ellen Ryan is 62 year old female initially seen at the request of [name] for extramammary pagets disease.  The patient is visiting her daughter now and lives in [place]. She reported a 2 year history of vulvar ulceration. She was seen by [name] for this problem in April, 2018 and was taken to the OR for an examination under anesthesia on 09/01/16. Extensive ulceration was present. 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    On 09/21/16, she underwent a complete simple vulvectomy.  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 No invasive carcinoma, negative margins. Post-operative course was uneventful and she was discharged with a foley catheter for one week post-op.  Interval History:    She has urinary urgency not resolved by medications. She feels her urine deviates in stream. Since early April, 2019 she developed irritation of the vulva made better with silvadine cream.  Given that she did not tolerate an office examination she was taken to the operating room on Sep 13, 2017 for examination under anesthesia and vulvar biopsies.  Intraoperative findings were significant for subtle acetowhite changes immediately lateral on the right side of the urethral meatus.  There also acetowhite changes to  the midline distal vagina.  Vagina was completely agglutinated.  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  The posterior vaginal introitus biopsy revealed negative benign normal squamous mucosa.  Review of Systems Constitutional: Feels well.  No fever, chills, early satiety, change in appetite.   Cardiovascular: No chest pain, shortness of breath, or edema.  Pulmonary: No cough or wheeze.  Gastrointestinal: No nausea, vomiting, or diarrhea. No bright red blood per rectum or change in bowel movement.  Genitourinary: No frequency, urgency, or dysuria. No vaginal bleeding or discharge.  Musculoskeletal: No myalgia or joint pain Neurologic: No weakness, numbness, or change in gait.  Psychology: No depression, anxiety, or insomnia  Current Meds:  Outpatient Encounter Medications as of 10/03/2017  Medication Sig  . acetaminophen (TYLENOL) 325 MG tablet Take 650 mg by mouth 2 (two) times daily.  Marland Kitchen amLODipine (NORVASC) 10 MG tablet Take 10 mg by mouth daily.  Marland Kitchen atorvastatin (LIPITOR) 40 MG tablet Take 1 tablet (40 mg total) by mouth daily. (Patient taking differently: Take 40 mg by mouth at bedtime. )  . Blood Glucose Monitoring Suppl (TRUE METRIX METER) w/Device KIT Use as directed  . glucose blood (TRUE METRIX BLOOD GLUCOSE TEST) test strip Use as instructed  . ibuprofen (ADVIL,MOTRIN) 200 MG tablet Take 200 mg by mouth 2 (two) times daily. Reminded to stop 48 hours prior to surgery  . metFORMIN (GLUCOPHAGE) 500 MG tablet Take 1 tablet (500 mg total) by mouth 2 (two) times daily with a meal.  . Multiple Vitamin (MULTIVITAMIN WITH MINERALS)  TABS tablet Take 1 tablet by mouth daily. Centrum  . polyethylene glycol (MIRALAX / GLYCOLAX) packet Take 17 g by mouth daily as needed. As needed  . TRUEPLUS LANCETS 28G MISC Check sugar three times a day  . [DISCONTINUED] oxyCODONE-acetaminophen (PERCOCET) 5-325 MG tablet Take 1-2 tablets by mouth every 4 (four) hours as needed for  severe pain. (Patient not taking: Reported on 10/03/2017)  . [DISCONTINUED] senna (SENOKOT) 8.6 MG TABS tablet Take 1 tablet (8.6 mg total) by mouth at bedtime as needed for mild constipation. (Patient not taking: Reported on 10/03/2017)   No facility-administered encounter medications on file as of 10/03/2017.     Allergy: No Known Allergies  Social Hx:   Social History   Socioeconomic History  . Marital status: Widowed    Spouse name: Not on file  . Number of children: Not on file  . Years of education: Not on file  . Highest education level: Not on file  Occupational History  . Not on file  Social Needs  . Financial resource strain: Not on file  . Food insecurity:    Worry: Not on file    Inability: Not on file  . Transportation needs:    Medical: Not on file    Non-medical: Not on file  Tobacco Use  . Smoking status: Never Smoker  . Smokeless tobacco: Never Used  Substance and Sexual Activity  . Alcohol use: Not Currently  . Drug use: No  . Sexual activity: Not on file  Lifestyle  . Physical activity:    Days per week: Not on file    Minutes per session: Not on file  . Stress: Not on file  Relationships  . Social connections:    Talks on phone: Not on file    Gets together: Not on file    Attends religious service: Not on file    Active member of club or organization: Not on file    Attends meetings of clubs or organizations: Not on file    Relationship status: Not on file  . Intimate partner violence:    Fear of current or ex partner: Not on file    Emotionally abused: Not on file    Physically abused: Not on file    Forced sexual activity: Not on file  Other Topics Concern  . Not on file  Social History Narrative  . Not on file    Past Surgical Hx:  Past Surgical History:  Procedure Laterality Date  . COLPOSCOPY VULVA W/ BIOPSY    . RADICAL VULVECTOMY N/A 09/21/2016   Procedure: RADICAL VULVECTOMY COMPLETE;  Surgeon: Everitt Amber, MD;  Location: [facility];   Service: Gynecology;  Laterality: N/A;  . VULVA Milagros Loll BIOPSY N/A 09/13/2017   Procedure: VULVAR BIOPSY;  Surgeon: Everitt Amber, MD;  Location: [facility];  Service: Gynecology;  Laterality: N/A;    Past Medical Hx:  Past Medical History:  Diagnosis Date  . Arthritis    both knees  . Diabetes mellitus, new onset ([place]) 12/27/2016   type 2  . Hyperlipidemia   . Hypertension   . Vulvar dysplasia     Family Hx: History reviewed. No pertinent family history.  Vitals:  Blood pressure (!) 145/80, pulse (!) 108, temperature 97.8 F (36.6 C), temperature source Oral, resp. rate 20, height 5' (1.524 m), weight 173 lb 8 oz (78.7 kg), SpO2 97 %.  Physical Exam:  General: Well developed, well nourished female in no acute distress. Alert and oriented  x 3.  Cardiovascular: Regular rate and rhythm. S1 and S2 normal.  Lungs: Clear to auscultation bilaterally. No wheezes/crackles/rhonchi noted.  Skin: No rashes or lesions present. Back: No CVA tenderness.  Abdomen: Abdomen soft, non-tender and obese. Active bowel sounds in all quadrants. No evidence of a fluid wave or abdominal masses.  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 There are no gross lesions on labia majora. The patient does not tolerate a pelvic exam adequate to visualize the labia minora and introitus due to discomfort and closing of her thighs.  Extremities: No bilateral cyanosis, edema, or clubbing.   Thereasa Solo, MD 10/03/2017, 4:27 PM

## 2017-10-03 NOTE — Patient Instructions (Addendum)
Plan to have CO2 laser of vulva at the [facility] on October 19, 2017.  You will receive a phone call from the pre-surgical RN to discuss instructions.  Please call for any questions or concerns.

## 2017-10-03 NOTE — H&P (View-Only) (Signed)
Follow Up Note: Gyn-Onc  Ellen Ryan 62 y.o. female  CC:  Chief Complaint  Patient presents with  . 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   We will take Ellen Ryan to the OR for CO2 laser of the periurethal area. Discussed this course of care with her daughter and patient. Discussed that there is risk of urethral stricture, however surgical excision associated with higher risk for this. Discussed postop course and recovery. Will keep a foley in for 1 week postop.   HPI:   Ellen Ryan is 62 year old female initially seen at the request of [name] for extramammary pagets disease.  The patient is visiting her daughter now and lives in [place]. She reported a 2 year history of vulvar ulceration. She was seen by [name] for this problem in April, 2018 and was taken to the OR for an examination under anesthesia on 09/01/16. Extensive ulceration was present. 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    On 09/21/16, she underwent a complete simple vulvectomy.  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 No invasive carcinoma, negative margins. Post-operative course was uneventful and she was discharged with a foley catheter for one week post-op.  Interval History:    She has urinary urgency not resolved by medications. She feels her urine deviates in stream. Since early April, 2019 she developed irritation of the vulva made better with silvadine cream.  Given that she did not tolerate an office examination she was taken to the operating room on Sep 13, 2017 for examination under anesthesia and vulvar biopsies.  Intraoperative findings were significant for subtle acetowhite changes immediately lateral on the right side of the urethral meatus.  There also acetowhite changes to  the midline distal vagina.  Vagina was completely agglutinated.  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  The posterior vaginal introitus biopsy revealed negative benign normal squamous mucosa.  Review of Systems Constitutional: Feels well.  No fever, chills, early satiety, change in appetite.   Cardiovascular: No chest pain, shortness of breath, or edema.  Pulmonary: No cough or wheeze.  Gastrointestinal: No nausea, vomiting, or diarrhea. No bright red blood per rectum or change in bowel movement.  Genitourinary: No frequency, urgency, or dysuria. No vaginal bleeding or discharge.  Musculoskeletal: No myalgia or joint pain Neurologic: No weakness, numbness, or change in gait.  Psychology: No depression, anxiety, or insomnia  Current Meds:  Outpatient Encounter Medications as of 10/03/2017  Medication Sig  . acetaminophen (TYLENOL) 325 MG tablet Take 650 mg by mouth 2 (two) times daily.  Marland Kitchen amLODipine (NORVASC) 10 MG tablet Take 10 mg by mouth daily.  Marland Kitchen atorvastatin (LIPITOR) 40 MG tablet Take 1 tablet (40 mg total) by mouth daily. (Patient taking differently: Take 40 mg by mouth at bedtime. )  . Blood Glucose Monitoring Suppl (TRUE METRIX METER) w/Device KIT Use as directed  . glucose blood (TRUE METRIX BLOOD GLUCOSE TEST) test strip Use as instructed  . ibuprofen (ADVIL,MOTRIN) 200 MG tablet Take 200 mg by mouth 2 (two) times daily. Reminded to stop 48 hours prior to surgery  . metFORMIN (GLUCOPHAGE) 500 MG tablet Take 1 tablet (500 mg total) by mouth 2 (two) times daily with a meal.  . Multiple Vitamin (MULTIVITAMIN WITH MINERALS)  TABS tablet Take 1 tablet by mouth daily. Centrum  . polyethylene glycol (MIRALAX / GLYCOLAX) packet Take 17 g by mouth daily as needed. As needed  . TRUEPLUS LANCETS 28G MISC Check sugar three times a day  . [DISCONTINUED] oxyCODONE-acetaminophen (PERCOCET) 5-325 MG tablet Take 1-2 tablets by mouth every 4 (four) hours as needed for  severe pain. (Patient not taking: Reported on 10/03/2017)  . [DISCONTINUED] senna (SENOKOT) 8.6 MG TABS tablet Take 1 tablet (8.6 mg total) by mouth at bedtime as needed for mild constipation. (Patient not taking: Reported on 10/03/2017)   No facility-administered encounter medications on file as of 10/03/2017.     Allergy: No Known Allergies  Social Hx:   Social History   Socioeconomic History  . Marital status: Widowed    Spouse name: Not on file  . Number of children: Not on file  . Years of education: Not on file  . Highest education level: Not on file  Occupational History  . Not on file  Social Needs  . Financial resource strain: Not on file  . Food insecurity:    Worry: Not on file    Inability: Not on file  . Transportation needs:    Medical: Not on file    Non-medical: Not on file  Tobacco Use  . Smoking status: Never Smoker  . Smokeless tobacco: Never Used  Substance and Sexual Activity  . Alcohol use: Not Currently  . Drug use: No  . Sexual activity: Not on file  Lifestyle  . Physical activity:    Days per week: Not on file    Minutes per session: Not on file  . Stress: Not on file  Relationships  . Social connections:    Talks on phone: Not on file    Gets together: Not on file    Attends religious service: Not on file    Active member of club or organization: Not on file    Attends meetings of clubs or organizations: Not on file    Relationship status: Not on file  . Intimate partner violence:    Fear of current or ex partner: Not on file    Emotionally abused: Not on file    Physically abused: Not on file    Forced sexual activity: Not on file  Other Topics Concern  . Not on file  Social History Narrative  . Not on file    Past Surgical Hx:  Past Surgical History:  Procedure Laterality Date  . COLPOSCOPY VULVA W/ BIOPSY    . RADICAL VULVECTOMY N/A 09/21/2016   Procedure: RADICAL VULVECTOMY COMPLETE;  Surgeon: Everitt Amber, MD;  Location: [facility];   Service: Gynecology;  Laterality: N/A;  . VULVA Milagros Loll BIOPSY N/A 09/13/2017   Procedure: VULVAR BIOPSY;  Surgeon: Everitt Amber, MD;  Location: [facility];  Service: Gynecology;  Laterality: N/A;    Past Medical Hx:  Past Medical History:  Diagnosis Date  . Arthritis    both knees  . Diabetes mellitus, new onset ([place]) 12/27/2016   type 2  . Hyperlipidemia   . Hypertension   . Vulvar dysplasia     Family Hx: History reviewed. No pertinent family history.  Vitals:  Blood pressure (!) 145/80, pulse (!) 108, temperature 97.8 F (36.6 C), temperature source Oral, resp. rate 20, height 5' (1.524 m), weight 173 lb 8 oz (78.7 kg), SpO2 97 %.  Physical Exam:  General: Well developed, well nourished female in no acute distress. Alert and oriented  x 3.  Cardiovascular: Regular rate and rhythm. S1 and S2 normal.  Lungs: Clear to auscultation bilaterally. No wheezes/crackles/rhonchi noted.  Skin: No rashes or lesions present. Back: No CVA tenderness.  Abdomen: Abdomen soft, non-tender and obese. Active bowel sounds in all quadrants. No evidence of a fluid wave or abdominal masses.  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 There are no gross lesions on labia majora. The patient does not tolerate a pelvic exam adequate to visualize the labia minora and introitus due to discomfort and closing of her thighs.  Extremities: No bilateral cyanosis, edema, or clubbing.   Thereasa Solo, MD 10/03/2017, 4:27 PM

## 2017-10-04 MED FILL — ATORVASTATIN CALCIUM 40 MG: 40 | 30 days supply | Qty: 30 | Fill #7

## 2017-10-04 MED FILL — metFORMIN HCL 1000 MG TABS: 1000 | 30 days supply | Qty: 60 | Fill #7

## 2017-10-04 MED FILL — AMLODIPINE BESYLATE 10 MG T: 10 | 30 days supply | Qty: 30 | Fill #3

## 2017-10-12 ENCOUNTER — Telehealth: Payer: Self-pay | Admitting: *Deleted

## 2017-10-12 ENCOUNTER — Encounter (HOSPITAL_BASED_OUTPATIENT_CLINIC_OR_DEPARTMENT_OTHER): Payer: Self-pay | Admitting: *Deleted

## 2017-10-12 NOTE — Telephone Encounter (Addendum)
Called and moved the patient's post op appt from July 19th to July 17th

## 2017-10-15 ENCOUNTER — Encounter (HOSPITAL_BASED_OUTPATIENT_CLINIC_OR_DEPARTMENT_OTHER): Payer: Self-pay

## 2017-10-15 ENCOUNTER — Other Ambulatory Visit: Payer: Self-pay

## 2017-10-15 NOTE — Pre-Procedure Instructions (Signed)
Dr. Roanna Banning made aware of Ellen Ryan medical history of very mild aortic stenosis per ECHO 01/16/2017, Dr. Roanna Banning is ok for [place] to proceed with her surgery at the [facility].

## 2017-10-15 NOTE — Progress Notes (Signed)
Spoke with:  Loyal Gambler (Daughter) NPO:  After Midnight, no gum, candy, or mints   Arrival time:  0530AM Labs:  EKG, Istat4 AM medications:  Amlodipine Pre op orders: Yes Ride home:  Loyal Gambler (daughter) [phone]

## 2017-10-18 ENCOUNTER — Encounter (HOSPITAL_BASED_OUTPATIENT_CLINIC_OR_DEPARTMENT_OTHER): Payer: Self-pay | Admitting: Anesthesiology

## 2017-10-18 NOTE — Anesthesia Preprocedure Evaluation (Addendum)
Anesthesia Evaluation  Patient identified by MRN, date of birth, ID band Patient awake    Reviewed: Allergy & Precautions, NPO status , Patient's Chart, lab work & pertinent test results  Airway Mallampati: III  TM Distance: >3 FB Neck ROM: Full    Dental no notable dental hx. (+) Teeth Intact   Pulmonary neg pulmonary ROS,    Pulmonary exam normal breath sounds clear to auscultation       Cardiovascular hypertension, Pt. on medications Normal cardiovascular exam Rhythm:Regular Rate:Normal     Neuro/Psych negative neurological ROS  negative psych ROS   GI/Hepatic negative GI ROS, Neg liver ROS,   Endo/Other  diabetes, Well Controlled, Type 2, Oral Hypoglycemic AgentsObesity  Renal/GU negative Renal ROS  negative genitourinary   Musculoskeletal  (+) Arthritis , Osteoarthritis,    Abdominal (+) + obese,   Peds  Hematology negative hematology ROS (+)   Anesthesia Other Findings   Reproductive/Obstetrics VIN III Paget's disease of Vulva                            Anesthesia Physical Anesthesia Plan  ASA: II  Anesthesia Plan: General   Post-op Pain Management:    Induction: Intravenous  PONV Risk Score and Plan: 4 or greater and Ondansetron, Dexamethasone, Midazolam, Scopolamine patch - Pre-op and Treatment may vary due to age or medical condition  Airway Management Planned: LMA  Additional Equipment:   Intra-op Plan:   Post-operative Plan: Extubation in OR  Informed Consent: I have reviewed the patients History and Physical, chart, labs and discussed the procedure including the risks, benefits and alternatives for the proposed anesthesia with the patient or authorized representative who has indicated his/her understanding and acceptance.   Dental advisory given  Plan Discussed with: CRNA and Surgeon  Anesthesia Plan Comments:        Anesthesia Quick Evaluation

## 2017-10-19 ENCOUNTER — Ambulatory Visit (HOSPITAL_BASED_OUTPATIENT_CLINIC_OR_DEPARTMENT_OTHER): Payer: Self-pay | Admitting: Anesthesiology

## 2017-10-19 ENCOUNTER — Encounter (HOSPITAL_BASED_OUTPATIENT_CLINIC_OR_DEPARTMENT_OTHER)
Admission: RE | Disposition: A | Discharge status: Home / Self Care | Payer: Self-pay | Source: Ambulatory Visit | Attending: Gynecologic Oncology

## 2017-10-19 ENCOUNTER — Encounter (HOSPITAL_BASED_OUTPATIENT_CLINIC_OR_DEPARTMENT_OTHER): Payer: Self-pay | Admitting: *Deleted

## 2017-10-19 ENCOUNTER — Other Ambulatory Visit: Payer: Self-pay

## 2017-10-19 ENCOUNTER — Ambulatory Visit (HOSPITAL_BASED_OUTPATIENT_CLINIC_OR_DEPARTMENT_OTHER)
Admission: RE | Admit: 2017-10-19 | Discharge: 2017-10-19 | Disposition: A | Discharge status: Home / Self Care | Payer: Self-pay | Source: Ambulatory Visit | Attending: Gynecologic Oncology | Admitting: Gynecologic Oncology

## 2017-10-19 DIAGNOSIS — R3915 Urgency of urination: Secondary | ICD-10-CM | POA: Insufficient documentation

## 2017-10-19 DIAGNOSIS — I10 Essential (primary) hypertension: Secondary | ICD-10-CM | POA: Insufficient documentation

## 2017-10-19 DIAGNOSIS — Z79899 Other long term (current) drug therapy: Secondary | ICD-10-CM | POA: Insufficient documentation

## 2017-10-19 DIAGNOSIS — E119 Type 2 diabetes mellitus without complications: Secondary | ICD-10-CM | POA: Insufficient documentation

## 2017-10-19 DIAGNOSIS — Z7984 Long term (current) use of oral hypoglycemic drugs: Secondary | ICD-10-CM | POA: Insufficient documentation

## 2017-10-19 DIAGNOSIS — D071 Carcinoma in situ of vulva: Secondary | ICD-10-CM | POA: Diagnosis present

## 2017-10-19 HISTORY — DX: Other specified malignant neoplasm of skin, unspecified: C44.99

## 2017-10-19 HISTORY — DX: Heart disease, unspecified: I51.9

## 2017-10-19 HISTORY — PX: CO2 LASER APPLICATION: SHX5778

## 2017-10-19 HISTORY — DX: Type 2 diabetes mellitus without complications: E11.9

## 2017-10-19 HISTORY — DX: Carcinoma in situ of vulva: D07.1

## 2017-10-19 HISTORY — DX: Other intervertebral disc degeneration, lumbar region without mention of lumbar back pain or lower extremity pain: M51.369

## 2017-10-19 HISTORY — DX: Leiomyoma of uterus, unspecified: D25.9

## 2017-10-19 HISTORY — DX: Nonrheumatic aortic (valve) stenosis: I35.0

## 2017-10-19 HISTORY — DX: Other intervertebral disc degeneration, lumbar region: M51.36

## 2017-10-19 HISTORY — DX: Personal history of vulvar dysplasia: Z87.412

## 2017-10-19 HISTORY — DX: Malignant neoplasm of vulva, unspecified: C51.9

## 2017-10-19 LAB — POCT I-STAT 4, (NA,K, GLUC, HGB,HCT)
Glucose, Bld: 137 mg/dL — ABNORMAL HIGH (ref 70–99)
HCT: 40 % (ref 36.0–46.0)
HEMOGLOBIN: 13.6 g/dL (ref 12.0–15.0)
Potassium: 3.6 mmol/L (ref 3.5–5.1)
Sodium: 142 mmol/L (ref 135–145)

## 2017-10-19 LAB — GLUCOSE, CAPILLARY: GLUCOSE-CAPILLARY: 134 mg/dL — AB (ref 70–99)

## 2017-10-19 SURGERY — CO2 LASER APPLICATION
Anesthesia: General | Site: Vulva

## 2017-10-19 MED ORDER — ONDANSETRON HCL 4 MG/2ML IJ SOLN
INTRAMUSCULAR | Status: DC | PRN
  Administered 2017-10-19: 4 mg via INTRAVENOUS

## 2017-10-19 MED ORDER — ARTIFICIAL TEARS OPHTHALMIC OINT
TOPICAL_OINTMENT | OPHTHALMIC | Status: AC
  Filled 2017-10-19: qty 3.5

## 2017-10-19 MED ORDER — PHENYLEPHRINE 40 MCG/ML (10ML) SYRINGE FOR IV PUSH (FOR BLOOD PRESSURE SUPPORT)
PREFILLED_SYRINGE | INTRAVENOUS | Status: AC
  Filled 2017-10-19: qty 10

## 2017-10-19 MED ORDER — DEXAMETHASONE SODIUM PHOSPHATE 10 MG/ML IJ SOLN
INTRAMUSCULAR | Status: AC
  Filled 2017-10-19: qty 1

## 2017-10-19 MED ORDER — HYDROCODONE-ACETAMINOPHEN 7.5-325 MG PO TABS
1.0000 | ORAL_TABLET | Freq: Once | ORAL | Status: DC | PRN
  Filled 2017-10-19: qty 1

## 2017-10-19 MED ORDER — LACTATED RINGERS IV SOLN
INTRAVENOUS | Status: DC
  Administered 2017-10-19 (×2): via INTRAVENOUS
  Filled 2017-10-19: qty 1000

## 2017-10-19 MED ORDER — ONDANSETRON HCL 4 MG/2ML IJ SOLN
INTRAMUSCULAR | Status: AC
  Filled 2017-10-19: qty 2

## 2017-10-19 MED ORDER — FENTANYL CITRATE (PF) 100 MCG/2ML IJ SOLN
25.0000 ug | INTRAMUSCULAR | Status: DC | PRN
  Filled 2017-10-19: qty 1

## 2017-10-19 MED ORDER — MEPERIDINE HCL 25 MG/ML IJ SOLN
6.2500 mg | INTRAMUSCULAR | Status: DC | PRN
  Filled 2017-10-19: qty 1

## 2017-10-19 MED ORDER — SILVER SULFADIAZINE 1 % EX CREA
TOPICAL_CREAM | CUTANEOUS | Status: DC | PRN
  Administered 2017-10-19: 1 via TOPICAL

## 2017-10-19 MED ORDER — MIDAZOLAM HCL 5 MG/5ML IJ SOLN
INTRAMUSCULAR | Status: DC | PRN
  Administered 2017-10-19 (×2): 1 mg via INTRAVENOUS

## 2017-10-19 MED ORDER — LIDOCAINE 2% (20 MG/ML) 5 ML SYRINGE
INTRAMUSCULAR | Status: DC | PRN
  Administered 2017-10-19: 60 mg via INTRAVENOUS

## 2017-10-19 MED ORDER — EPHEDRINE SULFATE-NACL 50-0.9 MG/10ML-% IV SOSY
PREFILLED_SYRINGE | INTRAVENOUS | Status: DC | PRN
  Administered 2017-10-19: 15 mg via INTRAVENOUS

## 2017-10-19 MED ORDER — LIDOCAINE 2% (20 MG/ML) 5 ML SYRINGE
INTRAMUSCULAR | Status: AC
  Filled 2017-10-19: qty 5

## 2017-10-19 MED ORDER — DEXAMETHASONE SODIUM PHOSPHATE 4 MG/ML IJ SOLN
INTRAMUSCULAR | Status: DC | PRN
  Administered 2017-10-19: 10 mg via INTRAVENOUS

## 2017-10-19 MED ORDER — ACETIC ACID 5 % SOLN
Status: DC | PRN
  Administered 2017-10-19: 1 via TOPICAL

## 2017-10-19 MED ORDER — PROPOFOL 10 MG/ML IV BOLUS
INTRAVENOUS | Status: DC | PRN
  Administered 2017-10-19: 150 mg via INTRAVENOUS

## 2017-10-19 MED ORDER — PHENYLEPHRINE 40 MCG/ML (10ML) SYRINGE FOR IV PUSH (FOR BLOOD PRESSURE SUPPORT)
PREFILLED_SYRINGE | INTRAVENOUS | Status: DC | PRN
  Administered 2017-10-19 (×2): 120 ug via INTRAVENOUS

## 2017-10-19 MED ORDER — ONDANSETRON HCL 4 MG/2ML IJ SOLN
4.0000 mg | Freq: Once | INTRAMUSCULAR | Status: DC | PRN
  Filled 2017-10-19: qty 2

## 2017-10-19 MED ORDER — EPHEDRINE 5 MG/ML INJ
INTRAVENOUS | Status: AC
  Filled 2017-10-19: qty 10

## 2017-10-19 MED ORDER — FENTANYL CITRATE (PF) 100 MCG/2ML IJ SOLN
INTRAMUSCULAR | Status: AC
  Filled 2017-10-19: qty 2

## 2017-10-19 MED ORDER — BUPIVACAINE LIPOSOME 1.3 % IJ SUSP
INTRAMUSCULAR | Status: DC | PRN
  Administered 2017-10-19: 10 mL

## 2017-10-19 MED ORDER — BUPIVACAINE HCL (PF) 0.25 % IJ SOLN
INTRAMUSCULAR | Status: DC | PRN
  Administered 2017-10-19: 5 mL

## 2017-10-19 MED ORDER — SENNA 8.6 MG PO TABS: 1.0000 | ORAL_TABLET | Freq: Every day | ORAL | 0 refills | Status: DC

## 2017-10-19 MED ORDER — PROPOFOL 10 MG/ML IV BOLUS
INTRAVENOUS | Status: AC
  Filled 2017-10-19: qty 40

## 2017-10-19 MED ORDER — OXYCODONE-ACETAMINOPHEN 5-325 MG PO TABS: 1.0000 | ORAL_TABLET | ORAL | 0 refills | Status: DC | PRN

## 2017-10-19 MED ORDER — MIDAZOLAM HCL 2 MG/2ML IJ SOLN
INTRAMUSCULAR | Status: AC
  Filled 2017-10-19: qty 2

## 2017-10-19 MED ORDER — FENTANYL CITRATE (PF) 100 MCG/2ML IJ SOLN
INTRAMUSCULAR | Status: DC | PRN
  Administered 2017-10-19: 50 ug via INTRAVENOUS
  Administered 2017-10-19 (×2): 25 ug via INTRAVENOUS

## 2017-10-19 SURGICAL SUPPLY — 16 items
CATH ROBINSON RED A/P 16FR (CATHETERS) ×2 IMPLANT
DEPRESSOR TONGUE BLADE STERILE (MISCELLANEOUS) ×2 IMPLANT
GLOVE BIO SURGEON STRL SZ 6 (GLOVE) ×4 IMPLANT
GOWN STRL REUS W/ TWL LRG LVL3 (GOWN DISPOSABLE) ×2 IMPLANT
GOWN STRL REUS W/TWL LRG LVL3 (GOWN DISPOSABLE) ×2
KIT TURNOVER CYSTO (KITS) ×2 IMPLANT
NEEDLE HYPO 25X1 1.5 SAFETY (NEEDLE) IMPLANT
PACK BASIN DAY SURGERY FS (CUSTOM PROCEDURE TRAY) ×2 IMPLANT
PACK PERINEAL COLD (PAD) ×2 IMPLANT
PACK VAGINAL MINOR WOMEN LF (CUSTOM PROCEDURE TRAY) ×2 IMPLANT
PAD PREP 24X48 CUFFED NSTRL (MISCELLANEOUS) ×2 IMPLANT
SWAB OB GYN 8IN STERILE 2PK (MISCELLANEOUS) ×4 IMPLANT
TOWEL OR 17X24 6PK STRL BLUE (TOWEL DISPOSABLE) ×2 IMPLANT
TRAY FOLEY BAG SILVER LF 14FR (CATHETERS) ×2 IMPLANT
VACUUM HOSE/TUBING 7/8INX6FT (MISCELLANEOUS) ×2 IMPLANT
WATER STERILE IRR 500ML POUR (IV SOLUTION) ×2 IMPLANT

## 2017-10-19 NOTE — Anesthesia Procedure Notes (Signed)
Procedure Name: LMA Insertion Date/Time: 10/19/2017 7:32 AM Performed by: Josephine Igo, MD Pre-anesthesia Checklist: Patient identified, Emergency Drugs available, Suction available and Patient being monitored Patient Re-evaluated:Patient Re-evaluated prior to induction Oxygen Delivery Method: Circle system utilized Preoxygenation: Pre-oxygenation with 100% oxygen Induction Type: IV induction Ventilation: Mask ventilation without difficulty LMA: LMA inserted LMA Size: 4.0 Number of attempts: 1 Airway Equipment and Method: Bite block Placement Confirmation: positive ETCO2 Tube secured with: Tape Dental Injury: Teeth and Oropharynx as per pre-operative assessment

## 2017-10-19 NOTE — Transfer of Care (Signed)
Post vital signs:  Last Vitals:  Vitals Value Taken Time  BP 127/68 10/19/2017  8:07 AM  Temp    Pulse 91 10/19/2017  8:10 AM  Resp 17 10/19/2017  8:10 AM  SpO2 95 % 10/19/2017  8:10 AM  Vitals shown include unvalidated device data.  Last Pain:  Vitals:   10/19/17 0603  TempSrc:   PainSc: 0-No pain      Patients Stated Pain Goal: 5 (10/19/17 0603) Immediate Anesthesia Transfer of Care Note  Patient: Ellen Ryan  Procedure(s) Performed: Procedure(s) (LRB): CO2 LASER APPLICATION OF THE VULVA (N/A)  Patient Location: PACU  Anesthesia Type: General  Level of Consciousness: awake, alert  and oriented  Airway & Oxygen Therapy: Patient Spontanous Breathing and Patient connected to nasal cannula oxygen  Post-op Assessment: Report given to PACU RN and Post -op Vital signs reviewed and stable  Post vital signs: Reviewed and stable  Complications: No apparent anesthesia complications

## 2017-10-19 NOTE — Interval H&P Note (Signed)
History and Physical Interval Note:  10/19/2017 7:22 AM  Ellen Ryan  has presented today for surgery, with the diagnosis of VULVAR DYSPLAGIA  The various methods of treatment have been discussed with the patient and family. After consideration of risks, benefits and other options for treatment, the patient has consented to  Procedure(s): CO2 LASER APPLICATION OF THE VULVA (N/A) as a surgical intervention .  The patient's history has been reviewed, patient examined, no change in status, stable for surgery.  I have reviewed the patient's chart and labs.  Questions were answered to the patient's satisfaction.     Thereasa Solo

## 2017-10-19 NOTE — Anesthesia Postprocedure Evaluation (Signed)
Anesthesia Post Note  Patient: Ellen Ryan  Procedure(s) Performed: CO2 LASER APPLICATION OF THE VULVA (N/A Vulva)     Patient location during evaluation: PACU Anesthesia Type: General Level of consciousness: awake and alert and oriented Pain management: pain level controlled Vital Signs Assessment: post-procedure vital signs reviewed and stable Respiratory status: spontaneous breathing, nonlabored ventilation and respiratory function stable Cardiovascular status: blood pressure returned to baseline and stable Postop Assessment: no apparent nausea or vomiting Anesthetic complications: no    Last Vitals:  Vitals:   10/19/17 0807 10/19/17 0815  BP: 127/68 105/64  Pulse: 96 88  Resp: 14 17  Temp: 37 C   SpO2: 93% 94%    Last Pain:  Vitals:   10/19/17 0807  TempSrc:   PainSc: Asleep                 Aleksis Jiggetts A.

## 2017-10-19 NOTE — Discharge Instructions (Signed)
Vulvar Laser After Care The vulva is the external female genitalia, outside and around the vagina and pubic bone. It consists of:  The skin on, and in front of, the pubic bone.  The clitoris.  The labia majora (large lips) on the outside of the vagina.  The labia minora (small lips) around the opening of the vagina.  The opening and the skin in and around the vagina.   ACTIVITY  Rest as much as possible the first two days after discharge.  No restrictions on heavy lifting   Do not drive a car for 24 hours  Increase activity gradually.  You may exercise after your laser site has healed. It was contribute to delayed healing if you apply too much friction to the area too quickly.  NUTRITION  You may resume your normal diet.  Drink 6 to 8 glasses of fluids a day.  Eat a healthy, balanced diet including portions of food from the meat (protein), milk, fruit, vegetable, and bread groups.  Your caregiver may recommend you take a multivitamin with iron.  ELIMINATION  You may notice that it burns when you urinate. To minimize this spray water onto your vulva as the urine passes out to dilute the urine. We will provide you with a spray bottle. A regular bottle of tap water can also be used.  Pat the area dry with toilet tissue or towel after voiding urine or stool. Do not wipe.  Re-apply burn cream in a thick layer (or neosporin or diaper cream) after voiding, rinsing and drying.  A hair dryer on the cool setting is also comforting to dry or soothe the area.  If constipation occurs, drink more liquids, and add more fruits, vegetables, and bran to your diet. You may take a mild laxative, such as Milk of Magnesia, Metamucil, or a stool softener such as Colace, with permission from your caregiver.    Remove foley in one week  HYGIENE  You may shower and wash your hair.  Avoid tub baths for 4 weeks.  Do not add any bath oils or chemicals to your bath water, after you have  permission to take baths.  Remove the foley catheter 1 week after the procedure.   While passing urine, pour water from a bottle or spray over your vulva to dilute the urine as it passes the incision (this will decrease burning and discomfort).  Clean yourself well after moving your bowels.  After urinating, do not wipe. Dap or pat dry with toilet paper or a dry cleath soft cloth.  A sitz bath will help keep your perineal area clean, reduce swelling, and provide comfort.  Avoid wearing underpants for the first 2 weeks and wear loose skirts to allow circulation of air around the laser site  Apply silvadine or neosporin or desitin to the wound as it heals.  HOME CARE INSTRUCTIONS   Apply a soft ice pack (or frozen bag of peas) to your perineum (vulva) every hour in the first 48 hours after surgery. This will reduce swelling.  Avoid activities that involve a lot of friction between your legs.  Avoid wearing pants or underpants in the 1st 2 weeks (skirts are preferable).  Take your temperature twice a day and record it, especially if you feel feverish or have chills.  Follow your caregiver's instructions about medicines, activity, and follow-up appointments after surgery.  Do not drink alcohol while taking pain medicine.  You may take over-the-counter medicine for pain, recommended by your caregiver.  If your pain is not relieved with medicine, call your caregiver.  Do not douche or use tampons (use a nonperfumed sanitary pad).  Do not have sexual intercourse until your caregiver gives you permission (typically 6 weeks postoperatively). Hugging, kissing, and playful sexual activity is fine with your caregiver's permission.  Warm sitz baths, with your caregiver's permission, are helpful to control swelling and discomfort.  Take showers instead of baths, until your caregiver gives you permission to take baths.  You may take a mild medicine for constipation, recommended by your  caregiver. Bran foods and drinking a lot of fluids will help with constipation.  Make sure your family understands everything about your operation and recovery. SEEK MEDICAL CARE IF:   You notice swelling and redness around the wound area.  You notice a foul smell coming from the wound or on the surgical dressing.  You notice the wound is separating.  You have painful or bloody urination.  You develop nausea and vomiting.  You develop diarrhea.  You develop a rash.  You have a reaction or allergy from the medicine.  You feel dizzy or light-headed.  You need stronger pain medicine. SEEK IMMEDIATE MEDICAL CARE IF:   You develop a temperature of 102 F (38.9 C) or higher.  You pass out.  You develop leg or chest pain.  You develop abdominal pain.  You develop shortness of breath.  You develop bleeding from the wound area.  You see pus in the wound area. MAKE SURE YOU:   Understand these instructions.  Will watch your condition.  Will get help right away if you are not doing well or get worse.  Contact Dr Denman George at # [phone]. After hours this line will connect to the after-hours-nurse line which will contact the doctor on call.    Indwelling Urinary Catheter Care, Adult Take good care of your catheter to keep it working and to prevent problems. How to wear your catheter Attach your catheter to your leg with tape (adhesive tape) or a leg strap. Make sure it is not too tight. If you use tape, remove any bits of tape that are already on the catheter. How to wear a drainage bag You should have:  A large overnight bag.  A small leg bag.  Overnight Bag You may wear the overnight bag at any time. Always keep the bag below the level of your bladder but off the floor. When you sleep, put a clean plastic bag in a wastebasket. Then hang the bag inside the wastebasket. Leg Bag Never wear the leg bag at night. Always wear the leg bag below your knee. Keep the leg  bag secure with a leg strap or tape. How to care for your skin  Clean the skin around the catheter at least once every day.  Shower every day. Do not take baths.  Put creams, lotions, or ointments on your genital area only as told by your doctor.  Do not use powders, sprays, or lotions on your genital area. How to clean your catheter and your skin 1. Wash your hands with soap and water. 2. Wet a washcloth in warm water and gentle (mild) soap. 3. Use the washcloth to clean the skin where the catheter enters your body. Clean downward and wipe away from the catheter in small circles. Do not wipe toward the catheter. 4. Pat the area dry with a clean towel. Make sure to clean off all soap. How to care for your drainage bags  Empty your drainage bag when it is ?- full or at least 2-3 times a day. Replace your drainage bag once a month or sooner if it starts to smell bad or look dirty. Do not clean your drainage bag unless told by your doctor. Emptying a drainage bag  Supplies Needed  Rubbing alcohol.  Gauze pad or cotton ball.  Tape or a leg strap.  Steps 1. Wash your hands with soap and water. 2. Separate (detach) the bag from your leg. 3. Hold the bag over the toilet or a clean container. Keep the bag below your hips and bladder. This stops pee (urine) from going back into the tube. 4. Open the pour spout at the bottom of the bag. 5. Empty the pee into the toilet or container. Do not let the pour spout touch any surface. 6. Put rubbing alcohol on a gauze pad or cotton ball. 7. Use the gauze pad or cotton ball to clean the pour spout. 8. Close the pour spout. 9. Attach the bag to your leg with tape or a leg strap. 10. Wash your hands.  Changing a drainage bag Supplies Needed  Alcohol wipes.  A clean drainage bag.  Adhesive tape or a leg strap.  Steps 1. Wash your hands with soap and water. 2. Separate the dirty bag from your leg. 3. Pinch the rubber catheter with your  fingers so that pee does not spill out. 4. Separate the catheter tube from the drainage tube where these tubes connect (at the connection valve). Do not let the tubes touch any surface. 5. Clean the end of the catheter tube with an alcohol wipe. Use a different alcohol wipe to clean the end of the drainage tube. 6. Connect the catheter tube to the drainage tube of the clean bag. 7. Attach the new bag to the leg with adhesive tape or a leg strap. 8. Wash your hands.  How to prevent infection and other problems  Never pull on your catheter or try to remove it. Pulling can damage tissue in your body.  Always wash your hands before and after touching your catheter.  If a leg strap gets wet, replace it with a dry one.  Drink enough fluids to keep your pee clear or pale yellow, or as told by your doctor.  Do not let the drainage bag or tubing touch the floor.  Wear cotton underwear.  If you are female, wipe from front to back after you poop (have a bowel movement).  Check on the catheter often to make sure it works and the tubing is not twisted. Get help if:  Your pee is cloudy.  Your pee smells unusually bad.  Your pee is not draining into the bag.  Your tube gets clogged.  Your catheter starts to leak.  Your bladder feels full. Get help right away if:  You have redness, swelling, or pain where the catheter enters your body.  You have fluid, pus, or a bad smell coming from the area where the catheter enters your body.  The area where the catheter enters your body feels warm.  You have a fever.  You have pain in your: ? Stomach (abdomen). ? Legs. ? Lower back. ? Bladder.  You see blood fill the catheter.  Your pee is pink or red.  You feel sick to your stomach (nauseous).  You throw up (vomit).  You have chills.  Your catheter gets pulled out. This information is not intended to replace advice given to  you by your health care provider. Make sure you discuss  any questions you have with your health care provider. Document Released: 08/05/2012 Document Revised: 03/08/2016 Document Reviewed: 09/23/2013 Elsevier Interactive Patient Education  2018 [place] Released: 11/23/2003 Document Revised: 08/25/2013 Document Reviewed: 03/12/2009 ExitCare Patient Information 2015 [place], [place]. This information is not intended to replace advice given to you by your health care provider. Make sure you discuss any questions you have with your health care provider.  Post Anesthesia Home Care Instructions  Activity: Get plenty of rest for the remainder of the day. A responsible individual must stay with you for 24 hours following the procedure.  For the next 24 hours, DO NOT: -Drive a car -Paediatric nurse -Drink alcoholic beverages -Take any medication unless instructed by your physician -Make any legal decisions or sign important papers.  Meals: Start with liquid foods such as gelatin or soup. Progress to regular foods as tolerated. Avoid greasy, spicy, heavy foods. If nausea and/or vomiting occur, drink only clear liquids until the nausea and/or vomiting subsides. Call your physician if vomiting continues.  Special Instructions/Symptoms: Your throat may feel dry or sore from the anesthesia or the breathing tube placed in your throat during surgery. If this causes discomfort, gargle with warm salt water. The discomfort should disappear within 24 hours.  If you had a scopolamine patch placed behind your ear for the management of post- operative nausea and/or vomiting:  1. The medication in the patch is effective for 72 hours, after which it should be removed.  Wrap patch in a tissue and discard in the trash. Wash hands thoroughly with soap and water. 2. You may remove the patch earlier than 72 hours if you experience unpleasant side effects which may include dry mouth, dizziness or visual disturbances. 3. Avoid touching the patch. Wash your  hands with soap and water after contact with the patch.   Information for Discharge Teaching: EXPAREL (bupivacaine liposome injectable suspension)   Your surgeon gave you EXPAREL(bupivacaine) in your surgical incision to help control your pain after surgery.   EXPAREL is a local anesthetic that provides pain relief by numbing the tissue around the surgical site.  EXPAREL is designed to release pain medication over time and can control pain for up to 72 hours.  Depending on how you respond to EXPAREL, you may require less pain medication during your recovery.  Possible side effects:  Temporary loss of sensation or ability to move in the area where bupivacaine was injected.  Nausea, vomiting, constipation  Rarely, numbness and tingling in your mouth or lips, lightheadedness, or anxiety may occur.  Call your doctor right away if you think you may be experiencing any of these sensations, or if you have other questions regarding possible side effects.  Follow all other discharge instructions given to you by your surgeon or nurse. Eat a healthy diet and drink plenty of water or other fluids.  If you return to the hospital for any reason within 96 hours following the administration of EXPAREL, please inform your health care providers.

## 2017-10-19 NOTE — Op Note (Addendum)
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   Procedure: The patient was identified in the preoperative holding area. Informed consent was signed on the chart. Patient was seen history was reviewed and exam was performed.   The patient was then taken to the operating room and placed in the supine position with SCD hose on. General anesthesia was then induced without difficulty. She was then placed in the dorsolithotomy position. The perineum was prepped with Betadine. The vagina was prepped with Betadine. The patient was then draped after the prep was dried.  Timeout was performed the patient, procedure, antibiotic, allergy, and length of procedure. 5% acetic acid solution was applied to the perineum. The vulvar tissues were inspected for areas of acetowhite changes or leukoplakia. The lesion was identified and the marking pen was used to circumscribe the area with appropriate surgical margins. The subcuticular tissues were infiltrated with exparel and quarter percent plain marcaine.   The patient's surgical field was draped with wet towels. The staff and patient ensured laser-safe eyewear and masks were fitted. The laser was set to 12 watts continuous. The laser was tested for accuracy on a tongue depressor.  The laser was applied to the circumscribed area of the vulva that had been previously identified. The tissue was ablated to the desired depth and the eschar was removed with a moistened sponge. When the entire lesion had been ablated the procedure was complete.  A foley catheter was inserted into the  bladder using sterile technique (including fresh change of gloves).  Silvadine cream was applied to the laser site.  All instrument, suture, laparotomy, Ray-Tec, and needle counts were correct x2. The patient tolerated the procedure well and was taken recovery room in stable condition. This is Everitt Amber dictating an operative note on [facility].  Donaciano Eva, MD

## 2017-10-22 ENCOUNTER — Ambulatory Visit: Payer: Self-pay | Admitting: Gynecologic Oncology

## 2017-10-22 ENCOUNTER — Encounter (HOSPITAL_BASED_OUTPATIENT_CLINIC_OR_DEPARTMENT_OTHER): Payer: Self-pay | Admitting: Gynecologic Oncology

## 2017-10-30 ENCOUNTER — Inpatient Hospital Stay: Payer: Self-pay | Admitting: Gynecologic Oncology

## 2017-10-30 ENCOUNTER — Telehealth: Payer: Self-pay | Admitting: *Deleted

## 2017-10-30 NOTE — Telephone Encounter (Signed)
Called and spoke with the patient's daughter regarding missing her appt for today. Per the patient's daughter "I'm sorry we had a family emergency. I meant to call yesterday. But I spoke with Dr. Denman George on the day of surgery and since I'm a nurse Dr. Denman George said it was ok that I take the foley out. Mom is doing well, voiding fine and having no pain." Message given to [facility] APP

## 2017-11-07 ENCOUNTER — Inpatient Hospital Stay: Payer: Self-pay | Attending: Gynecologic Oncology | Admitting: Gynecologic Oncology

## 2017-11-07 ENCOUNTER — Encounter: Payer: Self-pay | Admitting: Gynecologic Oncology

## 2017-11-07 VITALS — BP 156/89 | HR 96 | Temp 97.9°F | Resp 20 | Ht 60.0 in | Wt 173.2 lb

## 2017-11-07 DIAGNOSIS — D071 Carcinoma in situ of vulva: Secondary | ICD-10-CM | POA: Insufficient documentation

## 2017-11-07 DIAGNOSIS — R3915 Urgency of urination: Secondary | ICD-10-CM | POA: Insufficient documentation

## 2017-11-07 MED FILL — AMLODIPINE BESYLATE 10 MG T: 10 | 30 days supply | Qty: 30 | Fill #4

## 2017-11-07 NOTE — Patient Instructions (Signed)
Please return to see Dr Denman George in 6 months (contact [phone] in the fall of 2019 to schedule).  Dr Denman George should see you sooner if you develop new symptoms of itch.  Continue to use your silvadine cream and spray bottle if necessary for soothing as you heal.

## 2017-11-07 NOTE — Progress Notes (Signed)
Follow Up Note: Gyn-Onc  [name] 62 y.o. female  CC:  Chief Complaint  Patient presents with  . 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  Healing normally,  Recommend follow-up with me in 6 months for visual inspection.   HPI:   Ellen Ryan is 62 year old female initially seen at the request of [name] for extramammary pagets disease.  The patient is visiting her daughter now and lives in [place]. She reported a 2 year history of vulvar ulceration. She was seen by [name] for this problem in April, 2018 and was taken to the OR for an examination under anesthesia on 09/01/16. Extensive ulceration was present. 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    On 09/21/16, she underwent a complete simple vulvectomy.  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 No invasive carcinoma, negative margins. Post-operative course was uneventful and she was discharged with a foley catheter for one week post-op.   She has urinary urgency not resolved by medications. She feels her urine deviates in stream. Since early April, 2019 she developed irritation of the vulva made better with silvadine cream.  Given that she did not tolerate an office examination she was taken to the operating room on Sep 13, 2017 for examination under anesthesia and vulvar biopsies.  Intraoperative findings were significant for subtle acetowhite changes immediately lateral on the right side of the urethral meatus.  There also acetowhite changes to the midline distal vagina.  Vagina was completely agglutinated.  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  The posterior vaginal introitus biopsy revealed negative benign normal squamous  mucosa.  Interval History:   On 10/19/17 she underwent CO2 laser to the urethral meatus. Postop she had a foley for a week. She has no issues since foley removal.  Review of Systems Constitutional: Feels well.  No fever, chills, early satiety, change in appetite.   Cardiovascular: No chest pain, shortness of breath, or edema.  Pulmonary: No cough or wheeze.  Gastrointestinal: No nausea, vomiting, or diarrhea. No bright red blood per rectum or change in bowel movement.  Genitourinary: No frequency, urgency, or dysuria. No vaginal bleeding or discharge.  Musculoskeletal: No myalgia or joint pain Neurologic: No weakness, numbness, or change in gait.  Psychology: No depression, anxiety, or insomnia  Current Meds:  Outpatient Encounter Medications as of 11/07/2017  Medication Sig  . acetaminophen (TYLENOL) 325 MG tablet Take 650 mg by mouth 2 (two) times daily.  Marland Kitchen amLODipine (NORVASC) 10 MG tablet Take 10 mg by mouth daily.  Marland Kitchen atorvastatin (LIPITOR) 40 MG tablet Take 1 tablet (40 mg total) by mouth daily. (Patient taking differently: Take 40 mg by mouth at bedtime. )  . Blood Glucose Monitoring Suppl (TRUE METRIX METER) w/Device KIT Use as directed  . glucose blood (TRUE METRIX BLOOD GLUCOSE TEST) test strip Use as instructed  . ibuprofen (ADVIL,MOTRIN) 200 MG tablet Take 200 mg by mouth 2 (two) times daily. Reminded to stop 48 hours prior to surgery  . metFORMIN (GLUCOPHAGE) 500 MG tablet Take 1 tablet (500 mg total) by mouth 2 (two) times daily with a meal.  . Multiple Vitamin (MULTIVITAMIN WITH MINERALS) TABS tablet Take 1 tablet by mouth daily. Centrum  .  polyethylene glycol (MIRALAX / GLYCOLAX) packet Take 17 g by mouth daily as needed. As needed  . senna (SENOKOT) 8.6 MG TABS tablet Take 1 tablet (8.6 mg total) by mouth at bedtime.  . TRUEPLUS LANCETS 28G MISC Check sugar three times a day  . [DISCONTINUED] oxyCODONE-acetaminophen (PERCOCET) 5-325 MG tablet Take 1-2 tablets by mouth  every 4 (four) hours as needed for severe pain. (Patient not taking: Reported on 11/07/2017)   No facility-administered encounter medications on file as of 11/07/2017.     Allergy: No Known Allergies  Social Hx:   Social History   Socioeconomic History  . Marital status: Widowed    Spouse name: Not on file  . Number of children: Not on file  . Years of education: Not on file  . Highest education level: Not on file  Occupational History  . Not on file  Social Needs  . Financial resource strain: Not on file  . Food insecurity:    Worry: Not on file    Inability: Not on file  . Transportation needs:    Medical: Not on file    Non-medical: Not on file  Tobacco Use  . Smoking status: Never Smoker  . Smokeless tobacco: Never Used  Substance and Sexual Activity  . Alcohol use: Not Currently  . Drug use: No  . Sexual activity: Not on file  Lifestyle  . Physical activity:    Days per week: Not on file    Minutes per session: Not on file  . Stress: Not on file  Relationships  . Social connections:    Talks on phone: Not on file    Gets together: Not on file    Attends religious service: Not on file    Active member of club or organization: Not on file    Attends meetings of clubs or organizations: Not on file    Relationship status: Not on file  . Intimate partner violence:    Fear of current or ex partner: Not on file    Emotionally abused: Not on file    Physically abused: Not on file    Forced sexual activity: Not on file  Other Topics Concern  . Not on file  Social History Narrative  . Not on file    Past Surgical Hx:  Past Surgical History:  Procedure Laterality Date  . CO2 LASER APPLICATION N/A 8/88/9169   Procedure: CO2 LASER APPLICATION OF THE VULVA;  Surgeon: Everitt Amber, MD;  [facility];  Service: Gynecology;  Laterality: N/A;  . EUA/ VULVA BIOPSY  09-01-2016  [name]  [facility]  . RADICAL VULVECTOMY N/A 09/21/2016   Procedure: RADICAL  VULVECTOMY COMPLETE;  Surgeon: Everitt Amber, MD;  Location: [facility];  Service: Gynecology;  Laterality: N/A;  . TRANSTHORACIC ECHOCARDIOGRAM  01/16/2017   ef 60-65%,  grade 1 diastolic dysfunction/  very mild AV stenosis with moderately thickened and calcified leaflets (valve area 1.85cm^2)/  moderate LAE  . VULVA Milagros Loll BIOPSY N/A 09/13/2017   Procedure: VULVAR BIOPSY;  Surgeon: Everitt Amber, MD;  Location: [facility];  Service: Gynecology;  Laterality: N/A;    Past Medical Hx:  Past Medical History:  Diagnosis Date  . Aortic valve stenosis    per echo 01-16-2017  very mild AV stenosis w/ valve area 1.85cm^2  . Arthritis    both knees  . Disc degeneration, lumbar    L4-L5  . Grade I diastolic dysfunction 45/06/8880   Noted on ECHO  .  History of dysplasia of vulva    09-21-2017  VIN3  . Hyperlipidemia   . Hypertension   . 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 Type 2 diabetes mellitus ([place]) 12/27/2016  . Uterine fibroid   . 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 No pertinent family history.  Vitals:  Blood pressure (!) 156/89, pulse 96, temperature 97.9 F (36.6 C), temperature source Oral, resp. rate 20, height 5' (1.524 m), weight 173 lb 3.2 oz (78.6 kg), SpO2 98 %.  Physical Exam:  General: Well developed, well nourished female in no acute distress. Alert and oriented x 3.  Cardiovascular: Regular rate and rhythm. S1 and S2 normal.  Lungs: Clear to auscultation bilaterally. No wheezes/crackles/rhonchi noted.  Skin: No rashes or lesions present. Back: No CVA tenderness.  Abdomen: Abdomen soft, non-tender and obese. Active bowel sounds in all quadrants. No evidence of a fluid wave or abdominal masses.  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 There are no gross lesions on labia majora. The  periurethral tissues appear to be healing normally. Extremities: No bilateral cyanosis, edema, or clubbing.   Thereasa Solo, MD 11/07/2017, 11:59 AM

## 2017-11-09 ENCOUNTER — Ambulatory Visit: Payer: Self-pay | Admitting: Gynecologic Oncology

## 2017-11-12 ENCOUNTER — Ambulatory Visit: Payer: Self-pay | Admitting: Gynecologic Oncology

## 2017-11-23 ENCOUNTER — Ambulatory Visit: Payer: Self-pay | Admitting: Gynecologic Oncology

## 2017-11-29 ENCOUNTER — Ambulatory Visit: Payer: Self-pay | Attending: Family Medicine | Admitting: Physician Assistant

## 2017-11-29 VITALS — BP 115/79 | HR 105 | Temp 98.2°F | Ht 60.0 in | Wt 173.4 lb

## 2017-11-29 DIAGNOSIS — I10 Essential (primary) hypertension: Secondary | ICD-10-CM | POA: Insufficient documentation

## 2017-11-29 DIAGNOSIS — E1169 Type 2 diabetes mellitus with other specified complication: Secondary | ICD-10-CM | POA: Insufficient documentation

## 2017-11-29 DIAGNOSIS — Z79899 Other long term (current) drug therapy: Secondary | ICD-10-CM | POA: Insufficient documentation

## 2017-11-29 DIAGNOSIS — Z8544 Personal history of malignant neoplasm of other female genital organs: Secondary | ICD-10-CM | POA: Insufficient documentation

## 2017-11-29 DIAGNOSIS — I350 Nonrheumatic aortic (valve) stenosis: Secondary | ICD-10-CM | POA: Insufficient documentation

## 2017-11-29 DIAGNOSIS — E785 Hyperlipidemia, unspecified: Secondary | ICD-10-CM | POA: Insufficient documentation

## 2017-11-29 DIAGNOSIS — Z760 Encounter for issue of repeat prescription: Secondary | ICD-10-CM | POA: Insufficient documentation

## 2017-11-29 DIAGNOSIS — E119 Type 2 diabetes mellitus without complications: Secondary | ICD-10-CM

## 2017-11-29 DIAGNOSIS — Z7984 Long term (current) use of oral hypoglycemic drugs: Secondary | ICD-10-CM | POA: Insufficient documentation

## 2017-11-29 LAB — POCT GLYCOSYLATED HEMOGLOBIN (HGB A1C): Hemoglobin A1C: 6.1 % — AB (ref 4.0–5.6)

## 2017-11-29 LAB — GLUCOSE, POCT (MANUAL RESULT ENTRY): POC GLUCOSE: 124 mg/dL — AB (ref 70–99)

## 2017-11-29 MED ORDER — AMLODIPINE BESYLATE 10 MG PO TABS: 10.0000 mg | ORAL_TABLET | Freq: Every day | ORAL | 1 refills | Status: DC

## 2017-11-29 MED ORDER — ATORVASTATIN CALCIUM 40 MG PO TABS: 40.0000 mg | ORAL_TABLET | Freq: Every day | ORAL | 3 refills | Status: DC

## 2017-11-29 MED ORDER — METFORMIN HCL 500 MG PO TABS: 500.0000 mg | ORAL_TABLET | Freq: Two times a day (BID) | ORAL | 3 refills | Status: DC

## 2017-11-29 MED FILL — AMLODIPINE BESYLATE 10 MG T: 10 | 180 days supply | Qty: 180 | Fill #0

## 2017-11-29 MED FILL — ATORVASTATIN 40 MG TABLET: 40 | 180 days supply | Qty: 180 | Fill #0

## 2017-11-29 MED FILL — metFORMIN HCL 500 MG TABS: 500 | 180 days supply | Qty: 360 | Fill #0

## 2017-11-29 NOTE — Progress Notes (Signed)
Patient ID: Ellen Ryan, female   DOB: [birth year], 62 y.o.   MRN: [id number]   Ellen Ryan, is a 62 y.o. female  [id number]  [id number]  DOB - 1955/10/09  Subjective:  Chief Complaint and HPI: Ellen Ryan is a 62 y.o. female here today for medication RF.  Doing well.  Blood sugars running 150 or less.  Her daughter is here translating for her.  She is going to [place] for the next several months and is requesting 6 month supply of medications if possible.   She ate rice today.    ROS:   Constitutional:  No f/c, No night sweats, No unexplained weight loss. EENT:  No vision changes, No blurry vision, No hearing changes. No mouth, throat, or ear problems.  Respiratory: No cough, No SOB Cardiac: No CP, no palpitations GI:  No abd pain, No N/V/D. GU: No Urinary s/sx Musculoskeletal: No joint pain Neuro: No headache, no dizziness, no motor weakness.  Skin: No rash Endocrine:  No polydipsia. No polyuria.  Psych: Denies SI/HI  No problems updated.  ALLERGIES: No Known Allergies  PAST MEDICAL HISTORY: Past Medical History:  Diagnosis Date  . Aortic valve stenosis    per echo 01-16-2017  very mild AV stenosis w/ valve area 1.85cm^2  . Arthritis    both knees  . Disc degeneration, lumbar    L4-L5  . Grade I diastolic dysfunction 40/11/6759   Noted on ECHO  . History of dysplasia of vulva    09-21-2017  VIN3  . Hyperlipidemia   . Hypertension   . 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 Type 2 diabetes mellitus ([place]) 12/27/2016  . Uterine fibroid   . 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 Authorizing Provider  acetaminophen (TYLENOL) 325 MG tablet Take 650 mg by mouth 2 (two) times daily.   Yes [provider]  amLODipine (NORVASC) 10 MG tablet Take 1 tablet (10 mg total) by mouth daily.  11/29/17  Yes Argentina Donovan, PA-C  Blood Glucose Monitoring Suppl (TRUE METRIX METER) w/Device KIT Use as directed 12/27/16  Yes Jegede, Olugbemiga E, MD  glucose blood (TRUE METRIX BLOOD GLUCOSE TEST) test strip Use as instructed 12/27/16  Yes Jegede, Olugbemiga E, MD  metFORMIN (GLUCOPHAGE) 500 MG tablet Take 1 tablet (500 mg total) by mouth 2 (two) times daily with a meal. 11/29/17  Yes Charliegh Vasudevan M, PA-C  TRUEPLUS LANCETS 28G MISC Check sugar three times a day 12/27/16  Yes Jegede, Olugbemiga E, MD  atorvastatin (LIPITOR) 40 MG tablet Take 1 tablet (40 mg total) by mouth daily. 11/29/17   Argentina Donovan, PA-C  ibuprofen (ADVIL,MOTRIN) 200 MG tablet Take 200 mg by mouth 2 (two) times daily. Reminded to stop 48 hours prior to surgery    [provider]  Multiple Vitamin (MULTIVITAMIN WITH MINERALS) TABS tablet Take 1 tablet by mouth daily. Centrum    [provider]  polyethylene glycol (MIRALAX / GLYCOLAX) packet Take 17 g by mouth daily as needed. As needed    [provider]  senna (SENOKOT) 8.6 MG TABS tablet Take 1 tablet (8.6 mg total) by mouth at bedtime. Patient not taking: Reported on 11/29/2017 10/19/17   Everitt Amber, MD     Objective:  EXAM:   Vitals:   11/29/17 1416  BP: 115/79  Pulse: Marland Kitchen)  105  Temp: 98.2 F (36.8 C)  TempSrc: Oral  SpO2: 93%  Weight: 173 lb 6.4 oz (78.7 kg)  Height: 5' (1.524 m)    General appearance : A&OX3. NAD. Non-toxic-appearing HEENT: Atraumatic and Normocephalic.  PERRLA. EOM intact.  Mouth-MMM, post pharynx WNL w/o erythema, No PND. Neck: supple, no JVD. No cervical lymphadenopathy. No thyromegaly Chest/Lungs:  Breathing-non-labored, Good air entry bilaterally, breath sounds normal without rales, rhonchi, or wheezing.  Pulse ox at 98% on exam.   CVS: S1 S2 regular, no murmurs, gallops, rubs.  Rate at 86 on exam.   Extremities: Bilateral Lower Ext shows no edema, both legs are warm to touch with = pulse  throughout Neurology:  CN II-XII grossly intact, Non focal.   Psych:  TP linear. J/I WNL. Normal speech. Appropriate eye contact and affect.  Skin:  No Rash  Data Review Lab Results  Component Value Date   HGBA1C 6.1 (A) 11/29/2017   HGBA1C 5.8 05/02/2017   HGBA1C 12.8 12/27/2016     Assessment & Plan   1. Diabetes mellitus, new onset ([place]) Uncontrolled.  Work hard on eliminating sugar and white carbohydrates.  For now, she prefers to do this and not increase her dose of medication.   - Glucose (CBG) - HgB A1c - metFORMIN (GLUCOPHAGE) 500 MG tablet; Take 1 tablet (500 mg total) by mouth 2 (two) times daily with a meal.  Dispense: 180 tablet; Refill: 3 - Comprehensive metabolic panel  2. Dyslipidemia associated with type 2 diabetes mellitus (HCC) - atorvastatin (LIPITOR) 40 MG tablet; Take 1 tablet (40 mg total) by mouth daily.  Dispense: 90 tablet; Refill: 3 - Lipid panel  3. Essential hypertension Controlled.  Continue current regimen - amLODipine (NORVASC) 10 MG tablet; Take 1 tablet (10 mg total) by mouth daily.  Dispense: 90 tablet; Refill: 1 - CBC with Differential/Platelet    Patient have been counseled extensively about nutrition and exercise  Return for 3 to 6 months for f/up htn and DM.  The patient was given clear instructions to go to ER or return to medical center if symptoms don't improve, worsen or new problems develop. The patient verbalized understanding. The patient was told to call to get lab results if they haven't heard anything in the next week.     Freeman Caldron, PA-C [facility] and [place], [place]   11/29/2017, 2:43 PM

## 2017-11-29 NOTE — Patient Instructions (Signed)
Work to eliminate sugars and white carbohydrates from your diet to help with blood sugar control.

## 2017-11-30 LAB — COMPREHENSIVE METABOLIC PANEL
ALBUMIN: 4.2 g/dL (ref 3.6–4.8)
ALK PHOS: 92 IU/L (ref 39–117)
ALT: 46 IU/L — ABNORMAL HIGH (ref 0–32)
AST: 39 IU/L (ref 0–40)
Albumin/Globulin Ratio: 1.4 (ref 1.2–2.2)
BUN / CREAT RATIO: 18 (ref 12–28)
BUN: 11 mg/dL (ref 8–27)
Bilirubin Total: 0.2 mg/dL (ref 0.0–1.2)
CALCIUM: 10.3 mg/dL (ref 8.7–10.3)
CO2: 24 mmol/L (ref 20–29)
CREATININE: 0.62 mg/dL (ref 0.57–1.00)
Chloride: 100 mmol/L (ref 96–106)
GFR, EST AFRICAN AMERICAN: 110 mL/min/{1.73_m2} (ref >59)
GFR, EST NON AFRICAN AMERICAN: 96 mL/min/{1.73_m2} (ref >59)
GLOBULIN, TOTAL: 3.1 g/dL (ref 1.5–4.5)
GLUCOSE: 123 mg/dL — AB (ref 65–99)
Potassium: 3.7 mmol/L (ref 3.5–5.2)
SODIUM: 142 mmol/L (ref 134–144)
TOTAL PROTEIN: 7.3 g/dL (ref 6.0–8.5)

## 2017-11-30 LAB — LIPID PANEL
CHOL/HDL RATIO: 4.3 ratio (ref 0.0–4.4)
CHOLESTEROL TOTAL: 146 mg/dL (ref 100–199)
HDL: 34 mg/dL — ABNORMAL LOW (ref >39)
Triglycerides: 461 mg/dL — ABNORMAL HIGH (ref 0–149)

## 2017-11-30 LAB — CBC WITH DIFFERENTIAL/PLATELET
BASOS ABS: 0 10*3/uL (ref 0.0–0.2)
BASOS: 0 %
EOS (ABSOLUTE): 0.3 10*3/uL (ref 0.0–0.4)
Eos: 3 %
Hematocrit: 39.7 % (ref 34.0–46.6)
Hemoglobin: 12.4 g/dL (ref 11.1–15.9)
IMMATURE GRANS (ABS): 0 10*3/uL (ref 0.0–0.1)
IMMATURE GRANULOCYTES: 0 %
LYMPHS: 48 %
Lymphocytes Absolute: 4.2 10*3/uL — ABNORMAL HIGH (ref 0.7–3.1)
MCH: 23.5 pg — ABNORMAL LOW (ref 26.6–33.0)
MCHC: 31.2 g/dL — ABNORMAL LOW (ref 31.5–35.7)
MCV: 75 fL — AB (ref 79–97)
MONOS ABS: 0.4 10*3/uL (ref 0.1–0.9)
Monocytes: 5 %
NEUTROS PCT: 44 %
Neutrophils Absolute: 3.8 10*3/uL (ref 1.4–7.0)
PLATELETS: 301 10*3/uL (ref 150–450)
RBC: 5.28 x10E6/uL (ref 3.77–5.28)
RDW: 15.9 % — AB (ref 12.3–15.4)
WBC: 8.7 10*3/uL (ref 3.4–10.8)

## 2017-12-03 ENCOUNTER — Telehealth: Payer: Self-pay

## 2017-12-03 NOTE — Telephone Encounter (Signed)
CMA spoke to patient's daughter to inform on lab results.  

## 2017-12-03 NOTE — Telephone Encounter (Signed)
-----   Message from Argentina Donovan, [place] sent at 11/30/2017  1:17 PM EDT ----- Your kidney function and electrolytes are normal.  You have one liver function test that is minimally elevated.  We will recheck this in the future.  Your triglycerides are high.  Better blood sugar control will help this.  You can alos take fish oil 3grams daily to help this.  Otherwise, labs are stable.  Work on diet changes for blood sugar and follow-up as planned.  Thanks, Freeman Caldron, PA-C

## 2018-07-01 ENCOUNTER — Ambulatory Visit: Payer: Self-pay | Attending: Family Medicine | Admitting: Family Medicine

## 2018-07-01 ENCOUNTER — Encounter: Payer: Self-pay | Admitting: Family Medicine

## 2018-07-01 VITALS — BP 150/84 | HR 73 | Temp 97.3°F | Ht 60.0 in | Wt 165.8 lb

## 2018-07-01 DIAGNOSIS — I10 Essential (primary) hypertension: Secondary | ICD-10-CM

## 2018-07-01 DIAGNOSIS — E781 Pure hyperglyceridemia: Secondary | ICD-10-CM

## 2018-07-01 DIAGNOSIS — E785 Hyperlipidemia, unspecified: Secondary | ICD-10-CM

## 2018-07-01 DIAGNOSIS — R300 Dysuria: Secondary | ICD-10-CM

## 2018-07-01 DIAGNOSIS — E1169 Type 2 diabetes mellitus with other specified complication: Secondary | ICD-10-CM

## 2018-07-01 DIAGNOSIS — E119 Type 2 diabetes mellitus without complications: Secondary | ICD-10-CM

## 2018-07-01 LAB — POCT GLYCOSYLATED HEMOGLOBIN (HGB A1C): HbA1c, POC (controlled diabetic range): 6.1 % (ref 0.0–7.0)

## 2018-07-01 LAB — POCT URINALYSIS DIP (CLINITEK)
Bilirubin, UA: NEGATIVE
Glucose, UA: NEGATIVE mg/dL
Ketones, POC UA: NEGATIVE mg/dL
Leukocytes, UA: NEGATIVE
Nitrite, UA: NEGATIVE
POC PROTEIN,UA: NEGATIVE
Spec Grav, UA: 1.025 (ref 1.010–1.025)
Urobilinogen, UA: 0.2 E.U./dL
pH, UA: 5.5 (ref 5.0–8.0)

## 2018-07-01 LAB — GLUCOSE, POCT (MANUAL RESULT ENTRY): POC GLUCOSE: 76 mg/dL (ref 70–99)

## 2018-07-01 MED ORDER — METFORMIN HCL 500 MG PO TABS: 500.0000 mg | ORAL_TABLET | Freq: Two times a day (BID) | ORAL | 6 refills | Status: DC

## 2018-07-01 MED ORDER — TRUEPLUS LANCETS 28G MISC: 12 refills | Status: DC

## 2018-07-01 MED ORDER — GLUCOSE BLOOD VI STRP: ORAL_STRIP | 12 refills | Status: DC

## 2018-07-01 MED ORDER — AMLODIPINE BESYLATE 10 MG PO TABS: 10.0000 mg | ORAL_TABLET | Freq: Every day | ORAL | 6 refills | Status: DC

## 2018-07-01 MED ORDER — ATORVASTATIN CALCIUM 40 MG PO TABS: 40.0000 mg | ORAL_TABLET | Freq: Every day | ORAL | 6 refills | Status: DC

## 2018-07-01 NOTE — Patient Instructions (Signed)
High Triglycerides Eating Plan  Triglycerides are a type of fat in the blood. High levels of triglycerides can increase your risk of heart disease and stroke. If your triglyceride levels are high, choosing the right foods can help lower your triglycerides and keep your heart healthy. Work with your health care provider or a diet and nutrition specialist (dietitian) to develop an eating plan that is right for you.  What are tips for following this plan?  General guidelines    · Lose weight, if you are overweight. For most people, losing 5-10 lbs (2-5 kg) helps lower triglyceride levels. A weight-loss plan may include.  ? 30 minutes of exercise at least 5 days a week.  ? Reducing the amount of calories, sugar, and fat you eat.  · Eat a wide variety of fresh fruits, vegetables, and whole grains. These foods are high in fiber.  · Eat foods that contain healthy fats, such as fatty fish, nuts, seeds, and olive oil.  · Avoid foods that are high in added sugar, added salt (sodium), saturated fat, and trans fat.  · Avoid low-fiber, refined carbohydrates such as white bread, crackers, noodles, and white rice.  · Avoid foods with partially hydrogenated oils (trans fats), such as fried foods or stick margarine.  · Limit alcohol intake to no more than 1 drink a day for nonpregnant women and 2 drinks a day for men. One drink equals 12 oz of beer, 5 oz of wine, or 1½ oz of hard liquor. Your health care provider may recommend that you drink less depending on your overall health.  Reading food labels  · Check food labels for the amount of saturated fat. Choose foods with no or very little saturated fat.  · Check food labels for the amount of trans fat. Choose foods with no trans fat.  · Check food labels for the amount of cholesterol. Choose foods low in cholesterol. Ask your dietitian how much cholesterol you should have each day.  · Check food labels for the amount of sodium. Choose foods with less than 140 milligrams (mg) per  serving.  Shopping  · Buy dairy products labeled as nonfat (skim) or low-fat (1%).  · Avoid buying processed or prepackaged foods. These are often high in added sugar, sodium, and fat.  Cooking  · Choose healthy fats when cooking, such as olive oil or canola oil.  · Cook foods using lower fat methods, such as baking, broiling, boiling, or grilling.  · Make your own sauces, dressings, and marinades when possible, instead of buying them. Store-bought sauces, dressings, and marinades are often high in sodium and sugar.  Meal planning  · Eat more home-cooked food and less restaurant, buffet, and fast food.  · Eat fatty fish at least 2 times each week. Examples of fatty fish include salmon, trout, mackerel, tuna, and herring.  · If you eat whole eggs, do not eat more than 3 egg yolks per week.  What foods are recommended?  The items listed may not be a complete list. Talk with your dietitian about what dietary choices are best for you.  Grains  Whole wheat or whole grain breads, crackers, cereals, and pasta. Unsweetened oatmeal. Bulgur. Barley. Quinoa. Brown rice. Whole wheat flour tortillas.  Vegetables  Fresh or frozen vegetables. Low-sodium canned vegetables.  Fruits  All fresh, canned (in natural juice), or frozen fruits.  Meats and other protein foods  Skinless chicken or turkey. Ground chicken or turkey. Lean cuts of pork, trimmed of   fat. Fish and seafood, especially salmon, trout, and herring. Egg whites. Dried beans, peas, or lentils. Unsalted nuts or seeds. Unsalted canned beans. Natural peanut or almond butter.  Dairy  Low-fat dairy products. Skim or low-fat (1%) milk. Reduced fat (2%) and low-sodium cheese. Low-fat ricotta cheese. Low-fat cottage cheese. Plain, low-fat yogurt.  Fats and oils  Tub margarine without trans fats. Light or reduced-fat mayonnaise. Light or reduced-fat salad dressings. Avocado. Safflower, olive, sunflower, soybean, and canola oils.  What foods are not recommended?  The items listed  may not be a complete list. Talk with your dietitian about what dietary choices are best for you.  Grains  White bread. White (regular) pasta. White rice. Cornbread. Bagels. Pastries. Crackers that contain trans fat.  Vegetables  Creamed or fried vegetables. Vegetables in a cheese sauce.  Fruits  Sweetened dried fruit. Canned fruit in syrup. Fruit juice.  Meats and other protein foods  Fatty cuts of meat. Ribs. Chicken wings. Bacon. Sausage. Bologna. Salami. Chitterlings. Fatback. Hot dogs. Bratwurst. Packaged lunch meats.  Dairy  Whole or reduced-fat (2%) milk. Half-and-half. Cream cheese. Full-fat or sweetened yogurt. Full-fat cheese. Nondairy creamers. Whipped toppings. Processed cheese or cheese spreads. Cheese curds.  Beverages  Alcohol. Sweetened drinks, such as soda, lemonade, fruit drinks, or punches.  Fats and oils  Butter. Stick margarine. Lard. Shortening. Ghee. Bacon fat. Tropical oils, such as coconut, palm kernel, or palm oils.  Sweets and desserts  Corn syrup. Sugars. Honey. Molasses. Candy. Jam and jelly. Syrup. Sweetened cereals. Cookies. Pies. Cakes. Donuts. Muffins. Ice cream.  Condiments  Store-bought sauces, dressings, and marinades that are high in sugar, such as ketchup and barbecue sauce.  Summary  · High levels of triglycerides can increase the risk of heart disease and stroke. Choosing the right foods can help lower your triglycerides.  · Eat plenty of fresh fruits, vegetables, and whole grains. Choose low-fat dairy and lean meats. Eat fatty fish at least twice a week.  · Avoid processed and prepackaged foods with added sugar, sodium, saturated fat, and trans fat.  · If you need suggestions or have questions about what types of food are good for you, talk with your health care provider or a dietitian.  This information is not intended to replace advice given to you by your health care provider. Make sure you discuss any questions you have with your health care provider.  Document Released:  01/27/2004 Document Revised: 06/13/2016 Document Reviewed: 06/13/2016  Elsevier Interactive Patient Education © 2019 Elsevier Inc.

## 2018-07-01 NOTE — Progress Notes (Addendum)
Subjective:  Patient ID: Ellen Ryan, female    DOB: [birth year]  Age: 63 y.o. MRN: [id number]  CC: Diabetes and Hypertension   HPI Ellen Ryan is a 63 year old female with a history of type 2 diabetes mellitus (A1c 6.1), hypertension, dyslipidemia, VIN 3 (status post CO2 laser to periurethral tissues in 09/2017) here for follow-up visit accompanied by her daughter Ellen Ryan. She denies hypoglycemia, numbness in extremities and has not had an eye exam since 2018.  Doing well on metformin. Tolerating her statin with no complaints of myalgia but she does have hypertriglyceridemia from her last set of blood work.  She informs me that she discontinued fish oil capsules due to her leg muscles feeling tight and symptoms resolved after she discontinued it. She complains of intermittent dysuria but no hematuria, abdominal pain, vaginal itching or discharge.  She is unable to provide an accurate duration of symptoms but of note her daughter had given her ciprofloxacin tablets due to complaints of jaw pain 2 weeks ago and she took this for a course of 5 days. Last visit with Dr. Everitt Amber was in 10/2017 and six-month follow-up for visual inspection recommended.  Past Medical History:  Diagnosis Date  . Aortic valve stenosis    per echo 01-16-2017  very mild AV stenosis w/ valve area 1.85cm^2  . Arthritis    both knees  . Disc degeneration, lumbar    L4-L5  . Grade I diastolic dysfunction 83/41/9622   Noted on ECHO  . History of dysplasia of vulva    09-21-2017  VIN3  . Hyperlipidemia   . Hypertension   . 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 Type 2 diabetes mellitus ([place]) 12/27/2016  . Uterine fibroid   . 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 CO2 LASER APPLICATION N/A 2/97/9892   Procedure: CO2 LASER APPLICATION OF THE VULVA;  Surgeon: Everitt Amber, MD;  Location:  [facility];  Service: Gynecology;  Laterality: N/A;  . EUA/ VULVA BIOPSY  09-01-2016  [name]  [facility]  . RADICAL VULVECTOMY N/A 09/21/2016   Procedure: RADICAL VULVECTOMY COMPLETE;  Surgeon: Everitt Amber, MD;  Location: [facility];  Service: Gynecology;  Laterality: N/A;  . TRANSTHORACIC ECHOCARDIOGRAM  01/16/2017   ef 60-65%,  grade 1 diastolic dysfunction/  very mild AV stenosis with moderately thickened and calcified leaflets (valve area 1.85cm^2)/  moderate LAE  . VULVA Milagros Loll BIOPSY N/A 09/13/2017   Procedure: VULVAR BIOPSY;  Surgeon: Everitt Amber, MD;  Location: [facility];  Service: Gynecology;  Laterality: N/A;    History reviewed. No pertinent family history.  No Known Allergies  Outpatient Medications Prior to Visit  Medication Sig Dispense Refill  . acetaminophen (TYLENOL) 325 MG tablet Take 650 mg by mouth 2 (two) times daily.    . Blood Glucose Monitoring Suppl (TRUE METRIX METER) w/Device KIT Use as directed 1 kit 0  . ibuprofen (ADVIL,MOTRIN) 200 MG tablet Take 200 mg by mouth 2 (two) times daily. Reminded to stop 48 hours prior to surgery    . Multiple Vitamin (MULTIVITAMIN WITH MINERALS) TABS tablet Take 1 tablet by mouth daily. Centrum    . polyethylene glycol (MIRALAX / GLYCOLAX) packet Take 17 g by mouth daily as needed. As needed    . amLODipine (NORVASC) 10 MG tablet Take 1 tablet (10 mg total) by mouth daily. 90 tablet  1  . atorvastatin (LIPITOR) 40 MG tablet Take 1 tablet (40 mg total) by mouth daily. 90 tablet 3  . glucose blood (TRUE METRIX BLOOD GLUCOSE TEST) test strip Use as instructed 100 each 12  . metFORMIN (GLUCOPHAGE) 500 MG tablet Take 1 tablet (500 mg total) by mouth 2 (two) times daily with a meal. 180 tablet 3  . TRUEPLUS LANCETS 28G MISC Check sugar three times a day 100 each 12  . senna (SENOKOT) 8.6 MG TABS tablet Take 1 tablet (8.6 mg total) by mouth at bedtime. (Patient not taking: Reported on 11/29/2017) 30 each 0   No  facility-administered medications prior to visit.      ROS Review of Systems  Constitutional: Negative for activity change, appetite change and fatigue.  HENT: Negative for congestion, sinus pressure and sore throat.   Eyes: Negative for visual disturbance.  Respiratory: Negative for cough, chest tightness, shortness of breath and wheezing.   Cardiovascular: Negative for chest pain and palpitations.  Gastrointestinal: Negative for abdominal distention, abdominal pain and constipation.  Endocrine: Negative for polydipsia.  Genitourinary: Positive for dysuria. Negative for frequency.  Musculoskeletal: Positive for arthralgias (knees). Negative for back pain.  Skin: Negative for rash.  Neurological: Negative for tremors, light-headedness and numbness.  Hematological: Does not bruise/bleed easily.  Psychiatric/Behavioral: Negative for agitation and behavioral problems.    Objective:  BP (!) 150/84   Pulse 73   Temp (!) 97.3 F (36.3 C) (Oral)   Ht 5' (1.524 m)   Wt 165 lb 12.8 oz (75.2 kg)   SpO2 96%   BMI 32.38 kg/m   BP/Weight 07/01/2018 11/29/2017 9/38/1017  Systolic BP 510 258 527  Diastolic BP 84 79 89  Wt. (Lbs) 165.8 173.4 173.2  BMI 32.38 33.86 33.83      Physical Exam Constitutional:      Appearance: She is well-developed.  Cardiovascular:     Rate and Rhythm: Normal rate.     Heart sounds: Normal heart sounds. No murmur.  Pulmonary:     Effort: Pulmonary effort is normal.     Breath sounds: Normal breath sounds. No wheezing or rales.  Chest:     Chest wall: No tenderness.  Abdominal:     General: Bowel sounds are normal. There is no distension.     Palpations: Abdomen is soft. There is no mass.     Tenderness: There is no abdominal tenderness.  Musculoskeletal:     Comments: Restricted flexion, extension of knees  Neurological:     Mental Status: She is alert and oriented to person, place, and time.     CMP Latest Ref Rng & Units 11/29/2017 10/19/2017  09/13/2017  Glucose 65 - 99 mg/dL 123(H) 137(H) 117(H)  BUN 8 - 27 mg/dL 11 - -  Creatinine 0.57 - 1.00 mg/dL 0.62 - -  Sodium 134 - 144 mmol/L 142 142 143  Potassium 3.5 - 5.2 mmol/L 3.7 3.6 3.7  Chloride 96 - 106 mmol/L 100 - -  CO2 20 - 29 mmol/L 24 - -  Calcium 8.7 - 10.3 mg/dL 10.3 - -  Total Protein 6.0 - 8.5 g/dL 7.3 - -  Total Bilirubin 0.0 - 1.2 mg/dL 0.2 - -  Alkaline Phos 39 - 117 IU/L 92 - -  AST 0 - 40 IU/L 39 - -  ALT 0 - 32 IU/L 46(H) - -    Lipid Panel     Component Value Date/Time   CHOL 146 11/29/2017 1456   TRIG 461 (  H) 11/29/2017 1456   HDL 34 (L) 11/29/2017 1456   CHOLHDL 4.3 11/29/2017 1456   CHOLHDL 4.9 04/06/2016 1008   VLDL 37 (H) 04/06/2016 1008   LDLCALC Comment 11/29/2017 1456    CBC    Component Value Date/Time   WBC 8.7 11/29/2017 1456   WBC 14.6 (H) 09/22/2016 0415   RBC 5.28 11/29/2017 1456   RBC 4.53 09/22/2016 0415   HGB 12.4 11/29/2017 1456   HCT 39.7 11/29/2017 1456   PLT 301 11/29/2017 1456   MCV 75 (L) 11/29/2017 1456   MCH 23.5 (L) 11/29/2017 1456   MCH 22.5 (L) 09/22/2016 0415   MCHC 31.2 (L) 11/29/2017 1456   MCHC 31.4 09/22/2016 0415   RDW 15.9 (H) 11/29/2017 1456   LYMPHSABS 4.2 (H) 11/29/2017 1456   MONOABS 0.5 09/20/2016 1340   EOSABS 0.3 11/29/2017 1456   BASOSABS 0.0 11/29/2017 1456    Lab Results  Component Value Date   HGBA1C 6.1 07/01/2018    Assessment & Plan:   1. Diabetes mellitus, new onset ([place]) Controlled with A1c of 6.1 Counseled on Diabetic diet, my plate method, 700 minutes of moderate intensity exercise/week Keep blood sugar logs with fasting goals of 80-120 mg/dl, random of less than 180 and in the event of sugars less than 60 mg/dl or greater than 400 mg/dl please notify the clinic ASAP. It is recommended that you undergo annual eye exams and annual foot exams. Pneumonia vaccine is recommended. - POCT glucose (manual entry) - POCT glycosylated hemoglobin (Hb A1C) - metFORMIN (GLUCOPHAGE) 500  MG tablet; Take 1 tablet (500 mg total) by mouth 2 (two) times daily with a meal.  Dispense: 60 tablet; Refill: 6  2. Essential hypertension Uncontrolled due to running out of medications which I have refilled Counseled on blood pressure goal of less than 130/80, low-sodium, DASH diet, medication compliance, 150 minutes of moderate intensity exercise per week. Discussed medication compliance, adverse effects. - Basic Metabolic Panel - amLODipine (NORVASC) 10 MG tablet; Take 1 tablet (10 mg total) by mouth daily.  Dispense: 30 tablet; Refill: 6  3. Dyslipidemia associated with type 2 diabetes mellitus (HCC) Normal LDL but triglycerides are elevated Advised to resume fish oil capsules OTC along with a low-cholesterol diet If unable to tolerate we will have to discontinue fish oil capsules - atorvastatin (LIPITOR) 40 MG tablet; Take 1 tablet (40 mg total) by mouth daily.  Dispense: 30 tablet; Refill: 6  4. Hypertriglyceridemia See #3 above  5. Dysuria UA negative for UTI UA results unreliable due to recent completion of ciprofloxacin.  She is unable to provide exact duration of symptoms Advised that if symptoms return she is to call the clinic .Advised to schedule follow-up appointment with Dr. Denman George as she is due for follow-up at this time - POCT URINALYSIS DIP (CLINITEK)  Healthcare maintenance-she does not have the [facility] financial discount at this time as she is self-pay.  Advised to apply for the [facility] financial discount to facilitate referrals for necessary health screenings.  Meds ordered this encounter  Medications  . amLODipine (NORVASC) 10 MG tablet    Sig: Take 1 tablet (10 mg total) by mouth daily.    Dispense:  30 tablet    Refill:  6    If possible, give 6 month supply because patient is going to [place]  . atorvastatin (LIPITOR) 40 MG tablet    Sig: Take 1 tablet (40 mg total) by mouth daily.    Dispense:  30 tablet  Refill:  6  . metFORMIN (GLUCOPHAGE) 500 MG tablet      Sig: Take 1 tablet (500 mg total) by mouth 2 (two) times daily with a meal.    Dispense:  60 tablet    Refill:  6  . TRUEplus Lancets 28G MISC    Sig: Use daily    Dispense:  30 each    Refill:  12  . glucose blood (TRUE METRIX BLOOD GLUCOSE TEST) test strip    Sig: Use as instructed daily    Dispense:  30 each    Refill:  12    Follow-up: Return in about 3 months (around 10/01/2018) for Follow-up of chronic medical conditions.       Charlott Rakes, MD, FAAFP. [facility] and [place], [place]   07/01/2018, 5:19 PM

## 2018-07-02 LAB — BASIC METABOLIC PANEL
BUN / CREAT RATIO: 20 (ref 12–28)
BUN: 12 mg/dL (ref 8–27)
CO2: 23 mmol/L (ref 20–29)
Calcium: 10.6 mg/dL — ABNORMAL HIGH (ref 8.7–10.3)
Chloride: 101 mmol/L (ref 96–106)
Creatinine, Ser: 0.61 mg/dL (ref 0.57–1.00)
GFR calc Af Amer: 111 mL/min/{1.73_m2} (ref >59)
GFR, EST NON AFRICAN AMERICAN: 96 mL/min/{1.73_m2} (ref >59)
GLUCOSE: 86 mg/dL (ref 65–99)
Potassium: 3.7 mmol/L (ref 3.5–5.2)
SODIUM: 143 mmol/L (ref 134–144)

## 2018-07-02 MED FILL — AMLODIPINE BESYLATE 10 MG T: 10 | 30 days supply | Qty: 30 | Fill #0

## 2018-07-02 MED FILL — ATORVASTATIN CALCIUM 40 MG: 40 | 30 days supply | Qty: 30 | Fill #0

## 2018-07-02 MED FILL — metFORMIN HCL 500 MG TABS: 500 | 30 days supply | Qty: 60 | Fill #0

## 2018-07-03 MED FILL — TRUEplus LANCETS 28G MISC: 25 days supply | Qty: 100 | Fill #0

## 2018-07-03 MED FILL — TRUE METRIX GLUCOSE TEST ST: 25 days supply | Qty: 100 | Fill #0

## 2018-07-27 MED FILL — ATORVASTATIN CALCIUM 40 MG: 40 | 30 days supply | Qty: 30 | Fill #1 | Status: TO

## 2018-07-27 MED FILL — metFORMIN HCL 500 MG TABS: 500 | 30 days supply | Qty: 60 | Fill #1 | Status: TO

## 2018-07-27 MED FILL — AMLODIPINE BESYLATE 10 MG T: 10 | 30 days supply | Qty: 30 | Fill #1 | Status: TO

## 2018-08-27 ENCOUNTER — Telehealth: Payer: Self-pay | Admitting: *Deleted

## 2018-08-27 NOTE — Telephone Encounter (Signed)
Per [facility] APP the patient is scheduled to see Dr. Denman George Friday morning. Explained the she will be pre screened at the front door along with a temperature check. Also explained that no visitor and mask policy. Explained hat once the patient is in a room we will call the daughter and have her on speaker phone

## 2018-08-27 NOTE — Telephone Encounter (Signed)
Patient's daughter called "My mom is having pain in her vulvar area again. She said it's very painful, like a 7. She feels like something is pinching her. She has no bleeding or discharge. She is have pain with uriantion. No pain with hr bowels." Explained that I would give the message to [facility] APP and we would call back.

## 2018-08-30 ENCOUNTER — Inpatient Hospital Stay: Payer: BLUE CROSS/BLUE SHIELD | Attending: Gynecologic Oncology | Admitting: Gynecologic Oncology

## 2018-08-30 ENCOUNTER — Encounter: Payer: Self-pay | Admitting: Gynecologic Oncology

## 2018-08-30 ENCOUNTER — Other Ambulatory Visit: Payer: Self-pay

## 2018-08-30 VITALS — BP 148/88 | HR 101 | Temp 98.3°F | Resp 20 | Ht 60.0 in | Wt 170.0 lb

## 2018-08-30 DIAGNOSIS — D071 Carcinoma in situ of vulva: Secondary | ICD-10-CM

## 2018-08-30 DIAGNOSIS — R3915 Urgency of urination: Secondary | ICD-10-CM | POA: Diagnosis not present

## 2018-08-30 DIAGNOSIS — R102 Pelvic and perineal pain: Secondary | ICD-10-CM | POA: Diagnosis not present

## 2018-08-30 MED ORDER — CLOBETASOL PROPIONATE 0.05 % EX OINT: 1.0000 "application " | TOPICAL_OINTMENT | Freq: Every day | CUTANEOUS | 3 refills | Status: DC

## 2018-08-30 NOTE — Patient Instructions (Signed)
Dr Denman George feels this is thin skin causing the irritation. She is prescribing a steroid ointment to apply nightly for 3 months. Apply the ointment to the outside of the vagina nightly.  During the day you can continue to use the silvadine if this gives you comfort.  Please contact Dr Serita Grit office (at [phone]) in 2 months to request an appointment with her for August, 2020.

## 2018-08-30 NOTE — Progress Notes (Signed)
Follow Up Note: Gyn-Onc  Ellen Ryan 63 y.o. female  CC:  Chief Complaint  Patient presents with  . Vulvar pain  . 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  Now has irritation at the introitus without evidence of active VIN 3.   Recommend 3 months of nightly clobetasol ointment.   Recommend follow-up with me in 3 months for visual inspection.   HPI:   Ellen Ryan is 63 year old female initially seen at the request of [name] for extramammary pagets disease.  The patient is visiting her daughter now and lives in [place]. She reported a 2 year history of vulvar ulceration. She was seen by [name] for this problem in April, 2018 and was taken to the OR for an examination under anesthesia on 09/01/16. Extensive ulceration was present. 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    On 09/21/16, she underwent a complete simple vulvectomy.  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 No invasive carcinoma, negative margins. Post-operative course was uneventful and she was discharged with a foley catheter for one week post-op.   She has urinary urgency not resolved by medications. She feels her urine deviates in stream. Since early April, 2019 she developed irritation of the vulva made better with silvadine cream.  Given that she did not tolerate an office examination she was taken to the operating room on Sep 13, 2017 for examination under anesthesia and vulvar biopsies.  Intraoperative findings were significant for subtle acetowhite changes immediately lateral on the right side of the urethral meatus.  There also acetowhite changes to the midline distal vagina.  Vagina was completely agglutinated.  Biopsy from the periurethral  tissue on the right revealed VIN 3.  The posterior vaginal introitus biopsy revealed negative benign normal squamous mucosa.  On 10/19/17 she underwent CO2 laser to the urethral meatus.  Interval History:    Since May, 2020 she has been experiencing a burning sensation at the skin of her introitus/vulva. It gets some relief with silvadine cream. Worse on the right side than the left. Not periurethral   Review of Systems Constitutional: Feels well.  No fever, chills, early satiety, change in appetite.   Cardiovascular: No chest pain, shortness of breath, or edema.  Pulmonary: No cough or wheeze.  Gastrointestinal: No nausea, vomiting, or diarrhea. No bright red blood per rectum or change in bowel movement.  Genitourinary: No frequency, urgency, or dysuria. No vaginal bleeding or discharge. + vulvar burning. Musculoskeletal: No myalgia or joint pain Neurologic: No weakness, numbness, or change in gait.  Psychology: No depression, anxiety, or insomnia  Current Meds:  Outpatient Encounter Medications as of 08/30/2018  Medication Sig  . acetaminophen (TYLENOL) 325 MG tablet Take 650 mg by mouth 2 (two) times daily.  Marland Kitchen amLODipine (NORVASC) 10 MG tablet Take 1 tablet (10 mg total) by mouth daily.  Marland Kitchen atorvastatin (LIPITOR) 40 MG tablet Take 1 tablet (40 mg total) by mouth daily.  . Blood Glucose Monitoring Suppl (TRUE METRIX METER) w/Device KIT Use as directed  . glucose blood (TRUE METRIX BLOOD GLUCOSE TEST) test strip Use as instructed daily  . ibuprofen (ADVIL,MOTRIN) 200 MG tablet Take 200 mg by mouth 2 (two) times daily. Reminded to stop 48 hours prior to  surgery  . metFORMIN (GLUCOPHAGE) 500 MG tablet Take 1 tablet (500 mg total) by mouth 2 (two) times daily with a meal.  . Multiple Vitamin (MULTIVITAMIN WITH MINERALS) TABS tablet Take 1 tablet by mouth daily. Centrum  . polyethylene glycol (MIRALAX / GLYCOLAX) packet Take 17 g by mouth daily as needed. As needed  . TRUEplus Lancets 28G  MISC Use daily  . senna (SENOKOT) 8.6 MG TABS tablet Take 1 tablet (8.6 mg total) by mouth at bedtime. (Patient not taking: Reported on 08/30/2018)   No facility-administered encounter medications on file as of 08/30/2018.     Allergy: No Known Allergies  Social Hx:   Social History   Socioeconomic History  . Marital status: Widowed    Spouse name: Not on file  . Number of children: Not on file  . Years of education: Not on file  . Highest education level: Not on file  Occupational History  . Not on file  Social Needs  . Financial resource strain: Not on file  . Food insecurity:    Worry: Not on file    Inability: Not on file  . Transportation needs:    Medical: Not on file    Non-medical: Not on file  Tobacco Use  . Smoking status: Never Smoker  . Smokeless tobacco: Never Used  Substance and Sexual Activity  . Alcohol use: Not Currently  . Drug use: No  . Sexual activity: Not on file  Lifestyle  . Physical activity:    Days per week: Not on file    Minutes per session: Not on file  . Stress: Not on file  Relationships  . Social connections:    Talks on phone: Not on file    Gets together: Not on file    Attends religious service: Not on file    Active member of club or organization: Not on file    Attends meetings of clubs or organizations: Not on file    Relationship status: Not on file  . Intimate partner violence:    Fear of current or ex partner: Not on file    Emotionally abused: Not on file    Physically abused: Not on file    Forced sexual activity: Not on file  Other Topics Concern  . Not on file  Social History Narrative  . Not on file    Past Surgical Hx:  Past Surgical History:  Procedure Laterality Date  . CO2 LASER APPLICATION N/A 6/33/3545   Procedure: CO2 LASER APPLICATION OF THE VULVA;  Surgeon: Everitt Amber, MD;  Location: [facility];  Service: Gynecology;  Laterality: N/A;  . EUA/ VULVA BIOPSY  09-01-2016  [name]  [facility]  .  RADICAL VULVECTOMY N/A 09/21/2016   Procedure: RADICAL VULVECTOMY COMPLETE;  Surgeon: Everitt Amber, MD;  Location: [facility];  Service: Gynecology;  Laterality: N/A;  . TRANSTHORACIC ECHOCARDIOGRAM  01/16/2017   ef 60-65%,  grade 1 diastolic dysfunction/  very mild AV stenosis with moderately thickened and calcified leaflets (valve area 1.85cm^2)/  moderate LAE  . VULVA Milagros Loll BIOPSY N/A 09/13/2017   Procedure: VULVAR BIOPSY;  Surgeon: Everitt Amber, MD;  Location: [facility];  Service: Gynecology;  Laterality: N/A;    Past Medical Hx:  Past Medical History:  Diagnosis Date  . Aortic valve stenosis    per echo 01-16-2017  very mild AV stenosis w/ valve area 1.85cm^2  . Arthritis    both knees  . Disc degeneration, lumbar  L4-L5  . Grade I diastolic dysfunction 27/09/2374   Noted on ECHO  . History of dysplasia of vulva    09-21-2017  VIN3  . Hyperlipidemia   . Hypertension   . 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 Type 2 diabetes mellitus ([place]) 12/27/2016  . Uterine fibroid   . 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 No pertinent family history.  Vitals:  Blood pressure (!) 148/88, pulse (!) 101, temperature 98.3 F (36.8 C), resp. rate 20, height 5' (1.524 m), weight 170 lb (77.1 kg), SpO2 98 %.  Physical Exam:  General: Well developed, well nourished female in no acute distress. Alert and oriented x 3.  Cardiovascular: Regular rate and rhythm. S1 and S2 normal.  Lungs: Clear to auscultation bilaterally. No wheezes/crackles/rhonchi noted.  Skin: No rashes or lesions present. Back: No CVA tenderness.  Abdomen: Abdomen soft, non-tender and obese. Active bowel sounds in all quadrants. No evidence of a fluid wave or abdominal masses.  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 There are no gross  lesions on labia majora. The periurethral tissues appear to be healing normally. Acetic acid applied. There is a small area of desquamation on the right introitus corresponding to her pain. No apparent VIN or acetowhite changes.  Extremities: No bilateral cyanosis, edema, or clubbing.   Thereasa Solo, MD 08/30/2018, 10:35 AM

## 2018-09-05 ENCOUNTER — Other Ambulatory Visit: Payer: Self-pay | Admitting: Family Medicine

## 2018-09-05 ENCOUNTER — Encounter: Payer: Self-pay | Admitting: Family Medicine

## 2018-09-05 MED ORDER — MIRABEGRON ER 25 MG PO TB24: 25.0000 mg | ORAL_TABLET | Freq: Every day | ORAL | 3 refills | Status: DC

## 2018-09-06 ENCOUNTER — Other Ambulatory Visit: Payer: Self-pay | Admitting: Family Medicine

## 2018-09-06 MED ORDER — TOLTERODINE TARTRATE 2 MG PO TABS: 2.0000 mg | ORAL_TABLET | Freq: Two times a day (BID) | ORAL | 3 refills | Status: DC

## 2018-10-30 DIAGNOSIS — I10 Essential (primary) hypertension: Secondary | ICD-10-CM | POA: Diagnosis not present

## 2018-10-30 DIAGNOSIS — E119 Type 2 diabetes mellitus without complications: Secondary | ICD-10-CM | POA: Diagnosis not present

## 2018-10-30 DIAGNOSIS — E7800 Pure hypercholesterolemia, unspecified: Secondary | ICD-10-CM | POA: Diagnosis not present

## 2018-10-30 DIAGNOSIS — Z1331 Encounter for screening for depression: Secondary | ICD-10-CM | POA: Diagnosis not present

## 2018-10-30 DIAGNOSIS — D071 Carcinoma in situ of vulva: Secondary | ICD-10-CM | POA: Diagnosis not present

## 2018-11-01 ENCOUNTER — Telehealth: Payer: Self-pay | Admitting: *Deleted

## 2018-11-01 ENCOUNTER — Other Ambulatory Visit: Payer: Self-pay | Admitting: Internal Medicine

## 2018-11-01 DIAGNOSIS — Z1231 Encounter for screening mammogram for malignant neoplasm of breast: Secondary | ICD-10-CM

## 2018-11-01 NOTE — Telephone Encounter (Signed)
Patient's daughter called and scheduled a follow up appt for the patient. Daughter stated that the patient is having pain

## 2018-11-05 DIAGNOSIS — E785 Hyperlipidemia, unspecified: Secondary | ICD-10-CM | POA: Diagnosis not present

## 2018-11-05 DIAGNOSIS — Z0000 Encounter for general adult medical examination without abnormal findings: Secondary | ICD-10-CM | POA: Diagnosis not present

## 2018-11-05 DIAGNOSIS — I10 Essential (primary) hypertension: Secondary | ICD-10-CM | POA: Diagnosis not present

## 2018-11-05 DIAGNOSIS — E119 Type 2 diabetes mellitus without complications: Secondary | ICD-10-CM | POA: Diagnosis not present

## 2018-11-13 NOTE — Telephone Encounter (Signed)
ERROR

## 2018-11-15 ENCOUNTER — Encounter: Payer: Self-pay | Admitting: Gynecologic Oncology

## 2018-11-15 ENCOUNTER — Inpatient Hospital Stay: Payer: BLUE CROSS/BLUE SHIELD | Attending: Gynecologic Oncology | Admitting: Gynecologic Oncology

## 2018-11-15 ENCOUNTER — Other Ambulatory Visit: Payer: Self-pay

## 2018-11-15 VITALS — BP 164/100 | HR 111 | Temp 98.9°F | Resp 19 | Ht 60.0 in | Wt 173.0 lb

## 2018-11-15 DIAGNOSIS — R3915 Urgency of urination: Secondary | ICD-10-CM | POA: Insufficient documentation

## 2018-11-15 DIAGNOSIS — N904 Leukoplakia of vulva: Secondary | ICD-10-CM | POA: Diagnosis not present

## 2018-11-15 DIAGNOSIS — R102 Pelvic and perineal pain: Secondary | ICD-10-CM | POA: Diagnosis not present

## 2018-11-15 DIAGNOSIS — D071 Carcinoma in situ of vulva: Secondary | ICD-10-CM | POA: Diagnosis not present

## 2018-11-15 MED ORDER — CLOBETASOL PROPIONATE 0.05 % EX OINT: 1.0000 "application " | TOPICAL_OINTMENT | Freq: Every day | CUTANEOUS | 3 refills | Status: DC

## 2018-11-15 NOTE — Patient Instructions (Signed)
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 She does not see any cancer or pre-cancer. She recommends that you use the clobetasol ointment once a night applied to the area of the outside of the vagina where you feel the pain.  She will see you again in 3 months.  Please contact Dr Serita Grit office (at [phone]) in August, 2020 to request an appointment with her for October, 2020.

## 2018-11-15 NOTE — Progress Notes (Signed)
Follow Up Note: Gyn-Onc  Ellen Ryan 63 y.o. female  CC:  Chief Complaint  Patient presents with  . vulvar dystrophy  . vulvar pain  . 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  Now has irritation at the introitus without evidence of active VIN 3.   Recommend 3 months of nightly clobetasol ointment.   Recommend follow-up with me in 3 months for visual inspection.   HPI:   Ellen Ryan is 63 year old female initially seen at the request of [name] for extramammary pagets disease.  The patient is visiting her daughter now and lives in [place]. She reported a 2 year history of vulvar ulceration. She was seen by [name] for this problem in April, 2018 and was taken to the OR for an examination under anesthesia on 09/01/16. Extensive ulceration was present. 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    On 09/21/16, she underwent a complete simple vulvectomy.  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 No invasive carcinoma, negative margins. Post-operative course was uneventful and she was discharged with a foley catheter for one week post-op.   She has urinary urgency not resolved by medications. She feels her urine deviates in stream. Since early April, 2019 she developed irritation of the vulva made better with silvadine cream.  Given that she did not tolerate an office examination she was taken to the operating room on Sep 13, 2017 for examination under anesthesia and vulvar biopsies.  Intraoperative findings were significant for subtle acetowhite changes immediately lateral on the right side of the urethral meatus.  There also acetowhite changes to the midline distal vagina.  Vagina was completely agglutinated.  Biopsy from the periurethral tissue  on the right revealed VIN 3.  The posterior vaginal introitus biopsy revealed negative benign normal squamous mucosa.  On 10/19/17 she underwent CO2 laser to the urethral meatus.  Interval History:    Since May, 2020 she has been experiencing a burning sensation at the skin of her introitus/vulva. She had been prescribed clobetasol but had been using silvadine instead of clobetasol.   Review of Systems Constitutional: Feels well.  No fever, chills, early satiety, change in appetite.   Cardiovascular: No chest pain, shortness of breath, or edema.  Pulmonary: No cough or wheeze.  Gastrointestinal: No nausea, vomiting, or diarrhea. No bright red blood per rectum or change in bowel movement.  Genitourinary: No frequency, urgency, or dysuria. No vaginal bleeding or discharge. + vulvar burning. Musculoskeletal: No myalgia or joint pain Neurologic: No weakness, numbness, or change in gait.  Psychology: No depression, anxiety, or insomnia  Current Meds:  Outpatient Encounter Medications as of 11/15/2018  Medication Sig  . acetaminophen (TYLENOL) 325 MG tablet Take 650 mg by mouth 2 (two) times daily.  Marland Kitchen amLODipine (NORVASC) 10 MG tablet Take 1 tablet (10 mg total) by mouth daily.  Marland Kitchen atorvastatin (LIPITOR) 40 MG tablet Take 1 tablet (40 mg total) by mouth daily.  . Blood Glucose Monitoring Suppl (TRUE METRIX METER) w/Device KIT Use as directed  . clobetasol ointment (TEMOVATE) 2.08 % Apply 1 application topically at bedtime. Apply to skin of opening of vagina  . glucose blood (TRUE METRIX BLOOD GLUCOSE TEST) test strip Use as instructed daily  . ibuprofen (  ADVIL,MOTRIN) 200 MG tablet Take 200 mg by mouth 2 (two) times daily. Reminded to stop 48 hours prior to surgery  . metFORMIN (GLUCOPHAGE) 500 MG tablet Take 1 tablet (500 mg total) by mouth 2 (two) times daily with a meal.  . mirabegron ER (MYRBETRIQ) 25 MG TB24 tablet Take 1 tablet (25 mg total) by mouth daily.  . Multiple Vitamin  (MULTIVITAMIN WITH MINERALS) TABS tablet Take 1 tablet by mouth daily. Centrum  . polyethylene glycol (MIRALAX / GLYCOLAX) packet Take 17 g by mouth daily as needed. As needed  . senna (SENOKOT) 8.6 MG TABS tablet Take 1 tablet (8.6 mg total) by mouth at bedtime. (Patient not taking: Reported on 08/30/2018)  . tolterodine (DETROL) 2 MG tablet Take 1 tablet (2 mg total) by mouth 2 (two) times daily.  . TRUEplus Lancets 28G MISC Use daily  . [DISCONTINUED] clobetasol ointment (TEMOVATE) 0.37 % Apply 1 application topically at bedtime. Apply to skin of opening of vagina   No facility-administered encounter medications on file as of 11/15/2018.     Allergy: No Known Allergies  Social Hx:   Social History   Socioeconomic History  . Marital status: Widowed    Spouse name: Not on file  . Number of children: Not on file  . Years of education: Not on file  . Highest education level: Not on file  Occupational History  . Not on file  Social Needs  . Financial resource strain: Not on file  . Food insecurity    Worry: Not on file    Inability: Not on file  . Transportation needs    Medical: Not on file    Non-medical: Not on file  Tobacco Use  . Smoking status: Never Smoker  . Smokeless tobacco: Never Used  Substance and Sexual Activity  . Alcohol use: Not Currently  . Drug use: No  . Sexual activity: Not on file  Lifestyle  . Physical activity    Days per week: Not on file    Minutes per session: Not on file  . Stress: Not on file  Relationships  . Social Herbalist on phone: Not on file    Gets together: Not on file    Attends religious service: Not on file    Active member of club or organization: Not on file    Attends meetings of clubs or organizations: Not on file    Relationship status: Not on file  . Intimate partner violence    Fear of current or ex partner: Not on file    Emotionally abused: Not on file    Physically abused: Not on file    Forced sexual  activity: Not on file  Other Topics Concern  . Not on file  Social History Narrative  . Not on file    Past Surgical Hx:  Past Surgical History:  Procedure Laterality Date  . CO2 LASER APPLICATION N/A 0/48/8891   Procedure: CO2 LASER APPLICATION OF THE VULVA;  Surgeon: Everitt Amber, MD;  Location: [facility];  Service: Gynecology;  Laterality: N/A;  . EUA/ VULVA BIOPSY  09-01-2016  [name]  [facility]  . RADICAL VULVECTOMY N/A 09/21/2016   Procedure: RADICAL VULVECTOMY COMPLETE;  Surgeon: Everitt Amber, MD;  Location: [facility];  Service: Gynecology;  Laterality: N/A;  . TRANSTHORACIC ECHOCARDIOGRAM  01/16/2017   ef 60-65%,  grade 1 diastolic dysfunction/  very mild AV stenosis with moderately thickened and calcified leaflets (valve area 1.85cm^2)/  moderate  LAE  . VULVA Milagros Loll BIOPSY N/A 09/13/2017   Procedure: VULVAR BIOPSY;  Surgeon: Everitt Amber, MD;  Location: [facility];  Service: Gynecology;  Laterality: N/A;    Past Medical Hx:  Past Medical History:  Diagnosis Date  . Aortic valve stenosis    per echo 01-16-2017  very mild AV stenosis w/ valve area 1.85cm^2  . Arthritis    both knees  . Disc degeneration, lumbar    L4-L5  . Grade I diastolic dysfunction 17/91/5056   Noted on ECHO  . History of dysplasia of vulva    09-21-2017  VIN3  . Hyperlipidemia   . Hypertension   . 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 Type 2 diabetes mellitus ([place]) 12/27/2016  . Uterine fibroid   . 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 No pertinent family history.  Vitals:  Blood pressure (!) 164/100, pulse (!) 111, temperature 98.9 F (37.2 C), temperature source Oral, resp. rate 19, height 5' (1.524 m), weight 173 lb (78.5 kg), SpO2 99 %.  Physical Exam:  General: Well developed, well nourished female in no acute distress. Alert and oriented x 3.  Cardiovascular: Regular  rate and rhythm. S1 and S2 normal.  Lungs: Clear to auscultation bilaterally. No wheezes/crackles/rhonchi noted.  Skin: No rashes or lesions present. Back: No CVA tenderness.  Abdomen: Abdomen soft, non-tender and obese. Active bowel sounds in all quadrants. No evidence of a fluid wave or abdominal masses.  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 There are no gross lesions on labia majora. The periurethral tissues appear to be normal (though exam limited due to patient discomfort). Acetic acid applied. There is a small area of desquamation on the right introitus corresponding to her pain. No apparent VIN or acetowhite changes.  Extremities: No bilateral cyanosis, edema, or clubbing.   Thereasa Solo, MD 11/15/2018, 4:29 PM

## 2018-11-24 ENCOUNTER — Other Ambulatory Visit: Payer: Self-pay | Admitting: Family Medicine

## 2018-11-24 DIAGNOSIS — E119 Type 2 diabetes mellitus without complications: Secondary | ICD-10-CM

## 2018-11-24 DIAGNOSIS — E1169 Type 2 diabetes mellitus with other specified complication: Secondary | ICD-10-CM

## 2018-11-24 DIAGNOSIS — I10 Essential (primary) hypertension: Secondary | ICD-10-CM

## 2018-12-06 DIAGNOSIS — Z6833 Body mass index (BMI) 33.0-33.9, adult: Secondary | ICD-10-CM | POA: Diagnosis not present

## 2018-12-06 DIAGNOSIS — M25561 Pain in right knee: Secondary | ICD-10-CM | POA: Diagnosis not present

## 2018-12-06 DIAGNOSIS — E669 Obesity, unspecified: Secondary | ICD-10-CM | POA: Diagnosis not present

## 2018-12-06 DIAGNOSIS — M1711 Unilateral primary osteoarthritis, right knee: Secondary | ICD-10-CM | POA: Diagnosis not present

## 2018-12-20 ENCOUNTER — Other Ambulatory Visit: Payer: Self-pay

## 2018-12-20 ENCOUNTER — Ambulatory Visit
Admission: RE | Admit: 2018-12-20 | Discharge: 2018-12-20 | Disposition: A | Discharge status: Home / Self Care | Payer: BLUE CROSS/BLUE SHIELD | Source: Ambulatory Visit | Attending: Internal Medicine | Admitting: Internal Medicine

## 2018-12-20 DIAGNOSIS — Z1231 Encounter for screening mammogram for malignant neoplasm of breast: Secondary | ICD-10-CM

## 2018-12-23 ENCOUNTER — Other Ambulatory Visit: Payer: Self-pay | Admitting: Internal Medicine

## 2018-12-23 DIAGNOSIS — R928 Other abnormal and inconclusive findings on diagnostic imaging of breast: Secondary | ICD-10-CM

## 2019-01-03 DIAGNOSIS — N6321 Unspecified lump in the left breast, upper outer quadrant: Secondary | ICD-10-CM | POA: Diagnosis not present

## 2019-01-10 ENCOUNTER — Other Ambulatory Visit: Payer: Self-pay | Admitting: Family Medicine

## 2019-01-23 DIAGNOSIS — C4499 Other specified malignant neoplasm of skin, unspecified: Secondary | ICD-10-CM | POA: Diagnosis not present

## 2019-01-24 DIAGNOSIS — D509 Iron deficiency anemia, unspecified: Secondary | ICD-10-CM | POA: Diagnosis not present

## 2019-01-24 DIAGNOSIS — E119 Type 2 diabetes mellitus without complications: Secondary | ICD-10-CM | POA: Diagnosis not present

## 2019-01-29 DIAGNOSIS — E119 Type 2 diabetes mellitus without complications: Secondary | ICD-10-CM | POA: Diagnosis not present

## 2019-01-29 DIAGNOSIS — R928 Other abnormal and inconclusive findings on diagnostic imaging of breast: Secondary | ICD-10-CM | POA: Diagnosis not present

## 2019-01-29 DIAGNOSIS — D509 Iron deficiency anemia, unspecified: Secondary | ICD-10-CM | POA: Diagnosis not present

## 2019-01-29 DIAGNOSIS — D071 Carcinoma in situ of vulva: Secondary | ICD-10-CM | POA: Diagnosis not present

## 2019-02-07 DIAGNOSIS — Z01812 Encounter for preprocedural laboratory examination: Secondary | ICD-10-CM | POA: Diagnosis not present

## 2019-02-07 DIAGNOSIS — N9089 Other specified noninflammatory disorders of vulva and perineum: Secondary | ICD-10-CM | POA: Diagnosis not present

## 2019-02-07 DIAGNOSIS — Z20828 Contact with and (suspected) exposure to other viral communicable diseases: Secondary | ICD-10-CM | POA: Diagnosis not present

## 2019-02-14 DIAGNOSIS — N901 Moderate vulvar dysplasia: Secondary | ICD-10-CM | POA: Diagnosis not present

## 2019-02-14 DIAGNOSIS — Z7984 Long term (current) use of oral hypoglycemic drugs: Secondary | ICD-10-CM | POA: Diagnosis not present

## 2019-02-14 DIAGNOSIS — N3592 Unspecified urethral stricture, female: Secondary | ICD-10-CM | POA: Diagnosis not present

## 2019-02-14 DIAGNOSIS — D071 Carcinoma in situ of vulva: Secondary | ICD-10-CM | POA: Diagnosis not present

## 2019-02-14 DIAGNOSIS — C519 Malignant neoplasm of vulva, unspecified: Secondary | ICD-10-CM | POA: Diagnosis not present

## 2019-02-14 DIAGNOSIS — R87623 High grade squamous intraepithelial lesion on cytologic smear of vagina (HGSIL): Secondary | ICD-10-CM | POA: Diagnosis not present

## 2019-02-14 DIAGNOSIS — C4499 Other specified malignant neoplasm of skin, unspecified: Secondary | ICD-10-CM | POA: Diagnosis not present

## 2019-02-14 DIAGNOSIS — I10 Essential (primary) hypertension: Secondary | ICD-10-CM | POA: Diagnosis not present

## 2019-02-14 DIAGNOSIS — E119 Type 2 diabetes mellitus without complications: Secondary | ICD-10-CM | POA: Diagnosis not present

## 2019-03-17 DIAGNOSIS — M1711 Unilateral primary osteoarthritis, right knee: Secondary | ICD-10-CM | POA: Diagnosis not present

## 2019-03-18 DIAGNOSIS — R102 Pelvic and perineal pain: Secondary | ICD-10-CM | POA: Diagnosis not present

## 2020-03-09 IMAGING — MG DIGITAL SCREENING BILATERAL MAMMOGRAM WITH TOMO AND CAD
7 of 13 series · 8 of 38 positions shown · non-contrast
Comparison: Previous exam(s).

CLINICAL DATA: Screening.

EXAM:
DIGITAL SCREENING BILATERAL MAMMOGRAM WITH TOMO AND CAD

[L MLO synth-2D]
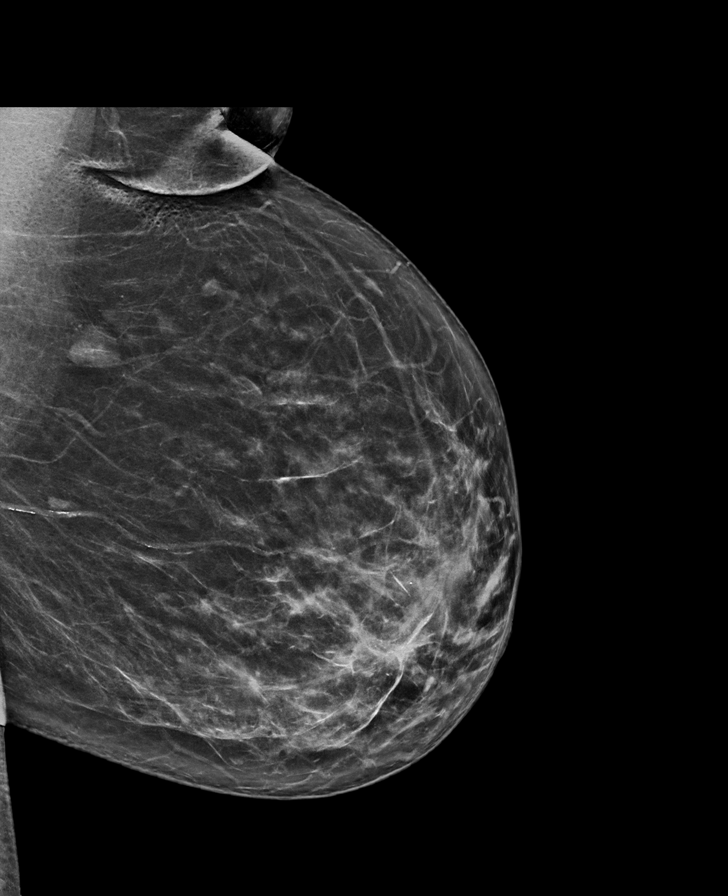

[R MLO synth-2D (1 of 2)]
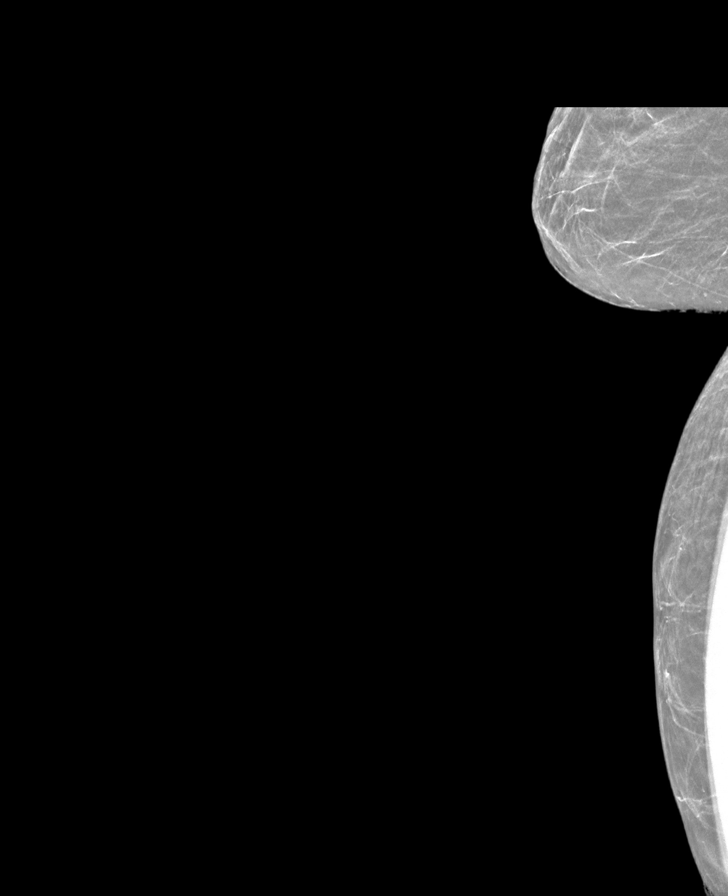

[L XCCL synth-2D]
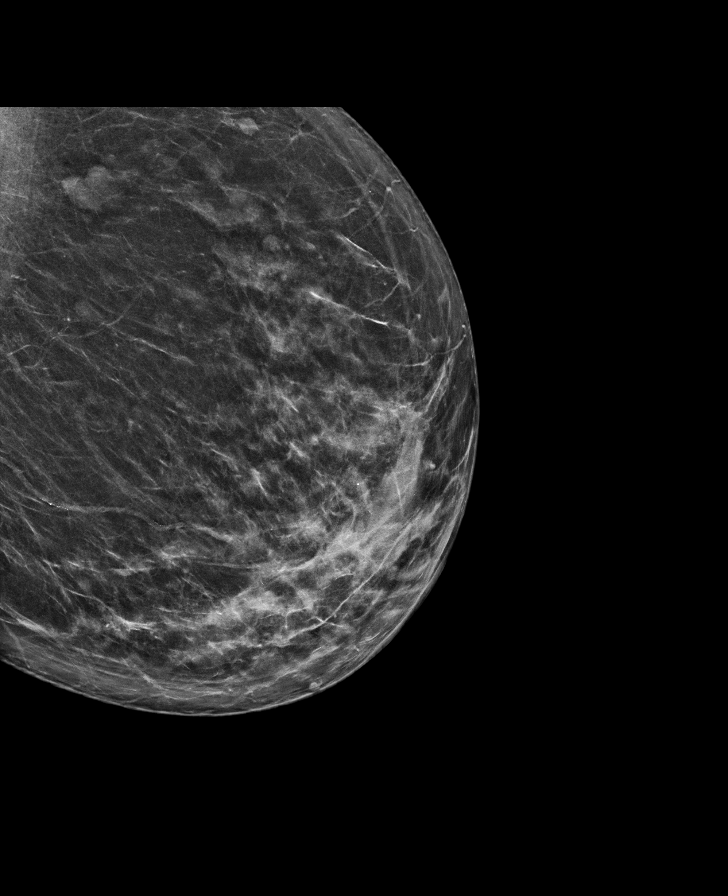

[R MLO synth-2D (2 of 2)]
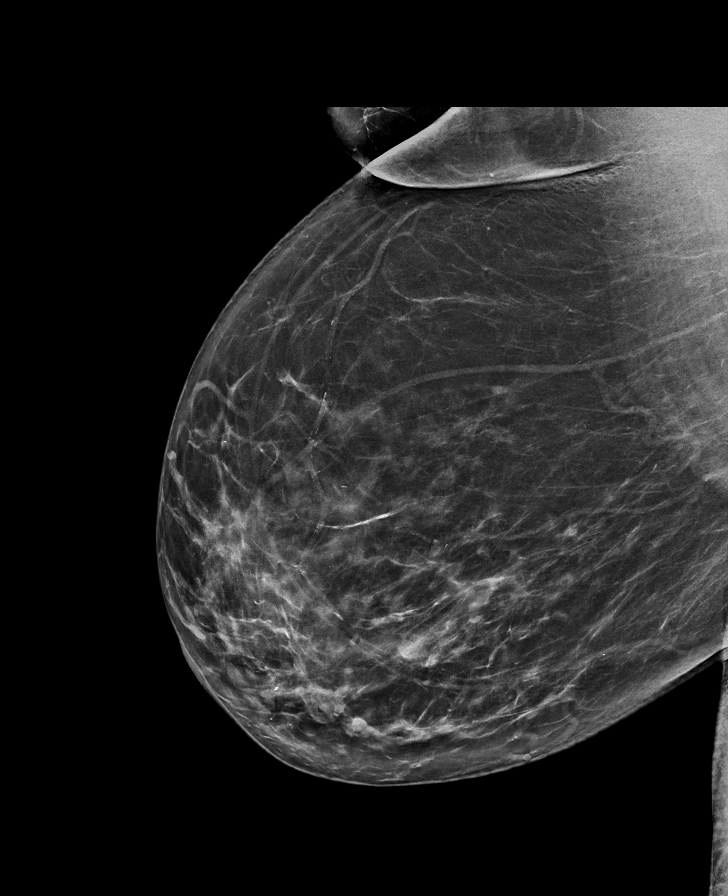

[R CC synth-2D]
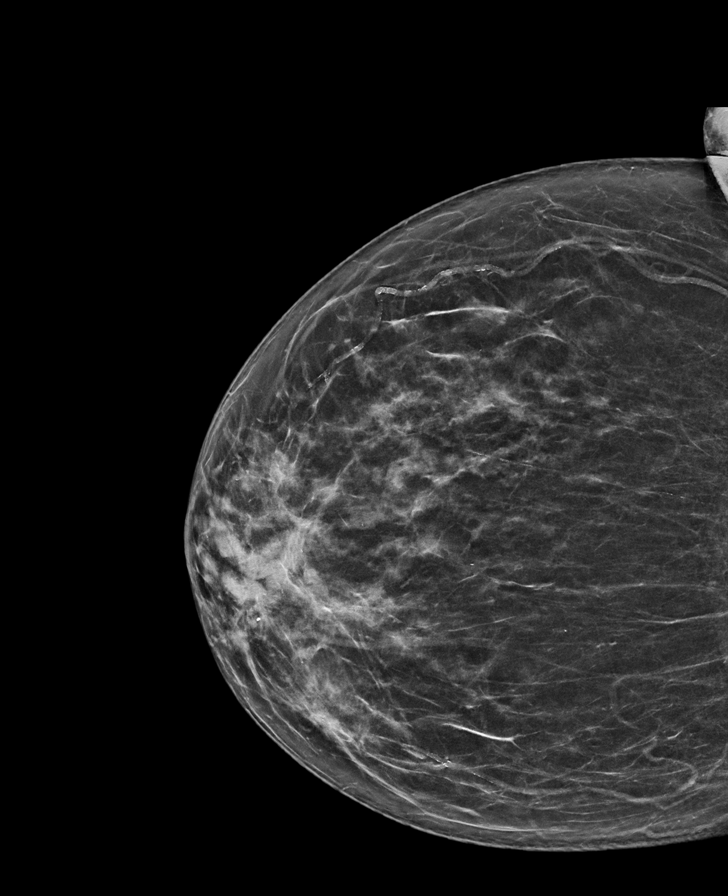

[L CC synth-2D]
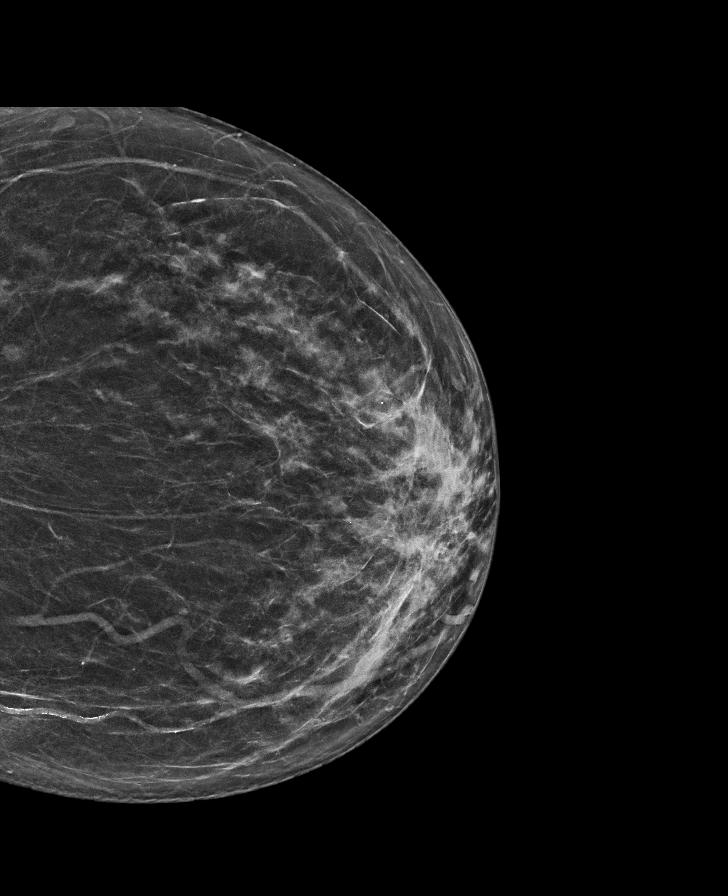

[L MLO tomo · 2 of 2 frames shown]
[frame 1/2]
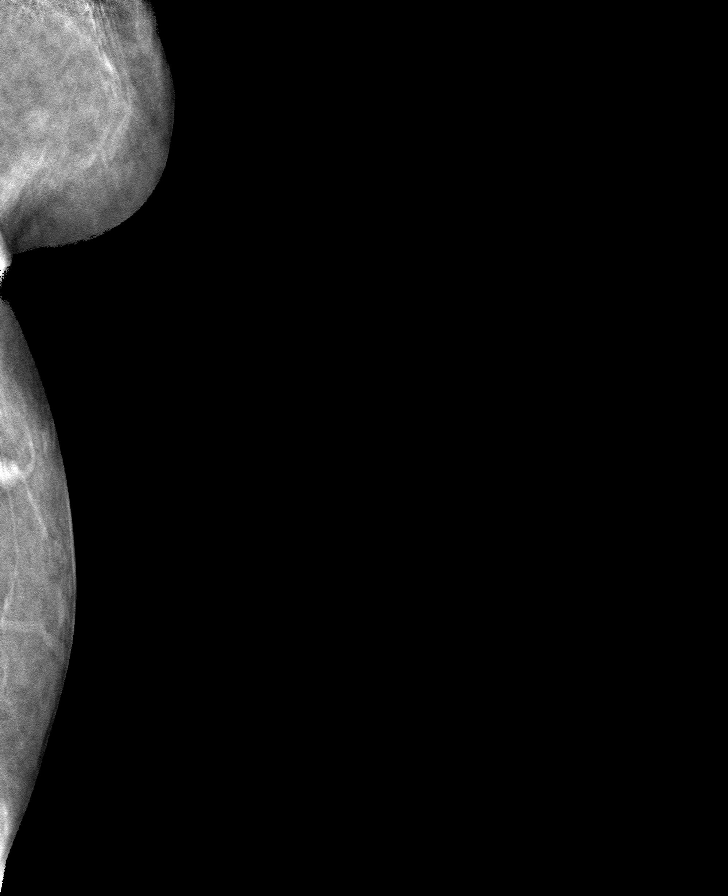
[frame 2/2]
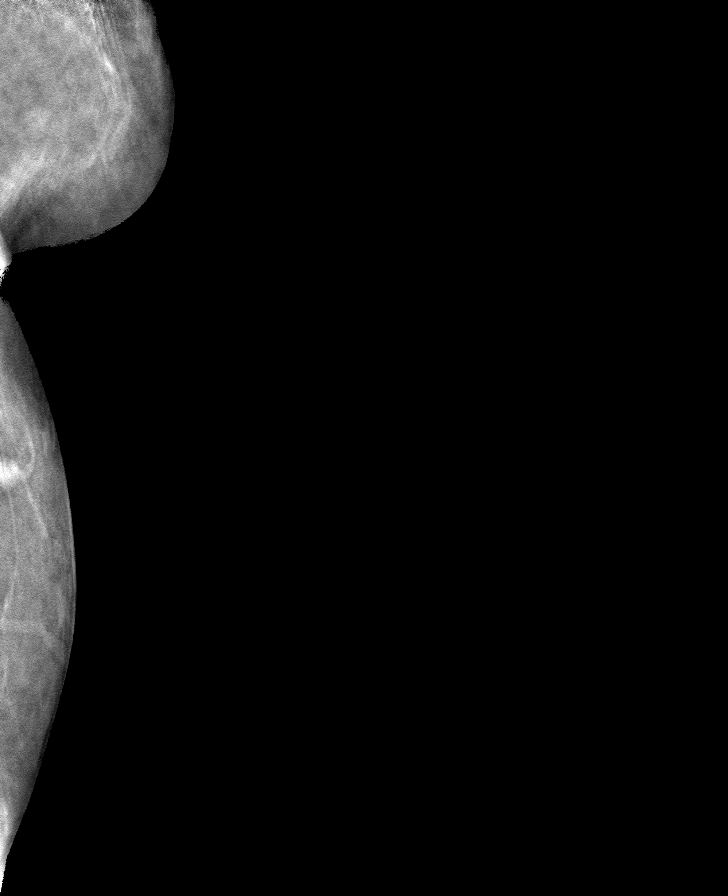

[8 of 38 positions shown; findings below may reference images not displayed]

ACR Breast Density Category c: The breast tissue is heterogeneously
dense, which may obscure small masses.
FINDINGS: In the left breast, possible masses warrant further evaluation. In
the right breast, no findings suspicious for malignancy.

Images were processed with CAD.
IMPRESSION: Further evaluation is suggested for possible masses in the left
breast.

RECOMMENDATION:
Diagnostic mammogram and possibly ultrasound of the left breast.
(Code:[id number])

The patient will be contacted regarding the findings, and additional
imaging will be scheduled.

BI-RADS CATEGORY  0: Incomplete. Need additional imaging evaluation
and/or prior mammograms for comparison.

## 2020-10-16 LAB — COLOGUARD: COLOGUARD: NEGATIVE

## 2021-06-03 ENCOUNTER — Other Ambulatory Visit: Payer: Self-pay

## 2021-06-03 ENCOUNTER — Emergency Department (HOSPITAL_BASED_OUTPATIENT_CLINIC_OR_DEPARTMENT_OTHER)
Admission: EM | Admit: 2021-06-03 | Discharge: 2021-06-03 | Disposition: A | Discharge status: Home / Self Care | Payer: Self-pay | Attending: Emergency Medicine | Admitting: Emergency Medicine

## 2021-06-03 ENCOUNTER — Encounter (HOSPITAL_BASED_OUTPATIENT_CLINIC_OR_DEPARTMENT_OTHER): Payer: Self-pay

## 2021-06-03 ENCOUNTER — Emergency Department (HOSPITAL_BASED_OUTPATIENT_CLINIC_OR_DEPARTMENT_OTHER): Payer: Self-pay

## 2021-06-03 DIAGNOSIS — D559 Anemia due to enzyme disorder, unspecified: Secondary | ICD-10-CM | POA: Insufficient documentation

## 2021-06-03 DIAGNOSIS — L309 Dermatitis, unspecified: Secondary | ICD-10-CM

## 2021-06-03 DIAGNOSIS — L03818 Cellulitis of other sites: Secondary | ICD-10-CM | POA: Insufficient documentation

## 2021-06-03 DIAGNOSIS — Z79899 Other long term (current) drug therapy: Secondary | ICD-10-CM | POA: Insufficient documentation

## 2021-06-03 DIAGNOSIS — N7689 Other specified inflammation of vagina and vulva: Secondary | ICD-10-CM | POA: Insufficient documentation

## 2021-06-03 LAB — BASIC METABOLIC PANEL
Anion gap: 10 (ref 5–15)
BUN: 8 mg/dL (ref 8–23)
CO2: 27 mmol/L (ref 22–32)
Calcium: 9.9 mg/dL (ref 8.9–10.3)
Chloride: 100 mmol/L (ref 98–111)
Creatinine, Ser: 0.5 mg/dL (ref 0.44–1.00)
GFR, Estimated: >60 mL/min (ref >60)
Glucose, Bld: 185 mg/dL — ABNORMAL HIGH (ref 70–99)
Potassium: 4.1 mmol/L (ref 3.5–5.1)
Sodium: 137 mmol/L (ref 135–145)

## 2021-06-03 LAB — URINALYSIS, ROUTINE W REFLEX MICROSCOPIC
Bilirubin Urine: NEGATIVE
Glucose, UA: NEGATIVE mg/dL
Hgb urine dipstick: NEGATIVE
Ketones, ur: NEGATIVE mg/dL
Nitrite: NEGATIVE
Specific Gravity, Urine: 1.014 (ref 1.005–1.030)
pH: 8 (ref 5.0–8.0)

## 2021-06-03 LAB — CBC WITH DIFFERENTIAL/PLATELET
Abs Immature Granulocytes: 0.12 10*3/uL — ABNORMAL HIGH (ref 0.00–0.07)
Basophils Absolute: 0 10*3/uL (ref 0.0–0.1)
Basophils Relative: 1 %
Eosinophils Absolute: 0.2 10*3/uL (ref 0.0–0.5)
Eosinophils Relative: 2 %
HCT: 38.8 % (ref 36.0–46.0)
Hemoglobin: 11.6 g/dL — ABNORMAL LOW (ref 12.0–15.0)
Immature Granulocytes: 1 %
Lymphocytes Relative: 52 %
Lymphs Abs: 4.5 10*3/uL — ABNORMAL HIGH (ref 0.7–4.0)
MCH: 22.4 pg — ABNORMAL LOW (ref 26.0–34.0)
MCHC: 29.9 g/dL — ABNORMAL LOW (ref 30.0–36.0)
MCV: 74.8 fL — ABNORMAL LOW (ref 80.0–100.0)
Monocytes Absolute: 0.8 10*3/uL (ref 0.1–1.0)
Monocytes Relative: 9 %
Neutro Abs: 3.1 10*3/uL (ref 1.7–7.7)
Neutrophils Relative %: 35 %
Platelet Morphology: NORMAL
Platelets: 348 10*3/uL (ref 150–400)
RBC: 5.19 MIL/uL — ABNORMAL HIGH (ref 3.87–5.11)
RDW: 15.9 % — ABNORMAL HIGH (ref 11.5–15.5)
WBC: 8.9 10*3/uL (ref 4.0–10.5)
nRBC: 0 % (ref 0.0–0.2)

## 2021-06-03 MED ORDER — HYDROCODONE-ACETAMINOPHEN 5-325 MG PO TABS: 1.0000 | ORAL_TABLET | Freq: Four times a day (QID) | ORAL | 0 refills | Status: DC | PRN

## 2021-06-03 MED ORDER — LIDOCAINE 5 % EX OINT: 1.0000 "application " | TOPICAL_OINTMENT | CUTANEOUS | 0 refills | Status: DC | PRN

## 2021-06-03 MED ORDER — IOHEXOL 300 MG/ML  SOLN
100.0000 mL | Freq: Once | INTRAMUSCULAR | Status: AC | PRN
  Administered 2021-06-03: 100 mL via INTRAVENOUS

## 2021-06-03 MED ORDER — CEPHALEXIN 500 MG PO CAPS: 500.0000 mg | ORAL_CAPSULE | Freq: Four times a day (QID) | ORAL | 0 refills | Status: DC

## 2021-06-03 MED ORDER — HYDROCODONE-ACETAMINOPHEN 5-325 MG PO TABS
2.0000 | ORAL_TABLET | Freq: Once | ORAL | Status: AC
  Administered 2021-06-03: 1 via ORAL
  Filled 2021-06-03: qty 2

## 2021-06-03 NOTE — ED Notes (Signed)
Assisted provider with pelvic exam.  Unable to do speculum exam due to pain and hx of vaginal surgery.  Left labia noted to be swollen and red with small amount of drainage.

## 2021-06-03 NOTE — Discharge Instructions (Signed)
As discussed you do not have any evidence of a urinary tract infection, however you do have what looks like some skin soft tissue infection of the vulva and perirectal region.  I do not see any evidence of an abscess on my exam, your CT shows no evidence of a communicating abscess.  We will start you on some antibiotics, as well as topical lidocaine for pain.  Please use Tylenol or ibuprofen for pain.  You may use 600 mg ibuprofen every 6 hours or 1000 mg of Tylenol every 6 hours.  You may choose to alternate between the 2.  This would be most effective.  Not to exceed 4 g of Tylenol within 24 hours.  Not to exceed 3200 mg ibuprofen 24 hours.  You can use the stronger pain medication in place of the tylenol above.  As we discussed I recommend that you follow-up with your OB/GYN within the next week to ensure that your symptoms are improving.

## 2021-06-03 NOTE — ED Provider Notes (Signed)
[facility] EMERGENCY DEPT Provider Note   CSN: [id number] Arrival date & time: 06/03/21  1737     History  No chief complaint on file.   Ellen Ryan is a 66 y.o. female with a past medical history significant for vaginal ulceration, vaginal pain, Paget's disease of the vulva, treated for urinary tract infection in December who presents with complaints of feeling of urine blockage, vaginal pain, pressure.  Patient denies any nausea, vomiting.  Patient denies any pain in the abdomen.  Patient denies any fever, chills. 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   HPI     Home Medications Prior to Admission medications   Medication Sig Start Date End Date Taking? Authorizing Provider  cephALEXin (KEFLEX) 500 MG capsule Take 1 capsule (500 mg total) by mouth 4 (four) times daily. 06/03/21  Yes Hannah Strader H, PA-C  HYDROcodone-acetaminophen (NORCO/VICODIN) 5-325 MG tablet Take 1-2 tablets by mouth every 6 (six) hours as needed. 06/03/21  Yes Damarious Holtsclaw H, PA-C  lidocaine (XYLOCAINE) 5 % ointment Apply 1 application topically as needed. 06/03/21  Yes Izzabell Klasen H, PA-C  acetaminophen (TYLENOL) 325 MG tablet Take 650 mg by mouth 2 (two) times daily.    [provider]  amLODipine (NORVASC) 10 MG tablet Take 1 tablet (10 mg total) by mouth daily. 07/01/18   Charlott Rakes, MD  atorvastatin (LIPITOR) 40 MG tablet Take 1 tablet (40 mg total) by mouth daily. 07/01/18   Charlott Rakes, MD  Blood Glucose Monitoring Suppl (TRUE METRIX METER) w/Device KIT Use as directed 12/27/16   Tresa Garter, MD  clobetasol ointment (TEMOVATE) 3.41 % Apply 1 application topically at bedtime. Apply to skin of opening of vagina 11/15/18   Everitt Amber, MD  glucose blood (TRUE METRIX BLOOD GLUCOSE TEST) test strip Use as instructed daily 07/01/18   Charlott Rakes, MD  ibuprofen (ADVIL,MOTRIN) 200 MG tablet Take 200 mg by mouth  2 (two) times daily. Reminded to stop 48 hours prior to surgery    [provider]  metFORMIN (GLUCOPHAGE) 500 MG tablet Take 1 tablet (500 mg total) by mouth 2 (two) times daily with a meal. 07/01/18   Charlott Rakes, MD  mirabegron ER (MYRBETRIQ) 25 MG TB24 tablet Take 1 tablet (25 mg total) by mouth daily. 09/05/18   Charlott Rakes, MD  Multiple Vitamin (MULTIVITAMIN WITH MINERALS) TABS tablet Take 1 tablet by mouth daily. Centrum    [provider]  polyethylene glycol (MIRALAX / GLYCOLAX) packet Take 17 g by mouth daily as needed. As needed    [provider]  senna (SENOKOT) 8.6 MG TABS tablet Take 1 tablet (8.6 mg total) by mouth at bedtime. Patient not taking: Reported on 08/30/2018 10/19/17   Everitt Amber, MD  tolterodine (DETROL) 2 MG tablet Take 1 tablet (2 mg total) by mouth 2 (two) times daily. Must have office visit for refills 01/10/19   Charlott Rakes, MD  TRUEplus Lancets 28G MISC Use daily 07/01/18   Charlott Rakes, MD      Allergies    Patient has no known allergies.    Review of Systems   Review of Systems  Genitourinary:  Positive for difficulty urinating, dysuria and pelvic pain.  All other systems reviewed and are negative.  Physical Exam Updated Vital Signs BP 132/74    Pulse 77    Temp 99.4 F (37.4 C) (Oral)    Resp 20    Ht 5' (1.524  m)    Wt 77.1 kg    SpO2 99%    BMI 33.20 kg/m  Physical Exam Vitals and nursing note reviewed.  Constitutional:      General: She is not in acute distress.    Appearance: Normal appearance.  HENT:     Head: Normocephalic and atraumatic.  Eyes:     General:        Right eye: No discharge.        Left eye: No discharge.  Cardiovascular:     Rate and Rhythm: Normal rate and regular rhythm.     Heart sounds: No murmur heard.   No friction rub. No gallop.  Pulmonary:     Effort: Pulmonary effort is normal.     Breath sounds: Normal breath sounds.  Abdominal:     General: Bowel sounds are normal.      Palpations: Abdomen is soft.  Genitourinary:    Comments: Chaperone Present:  Patient with normal appearance of urethra, below this the vulva is fused, the fusion looks well-healed, and is nontender.  At the lower end of the labia there is round red skin, some minimal swelling of the left labia with small amount of purulent drainage.  The redness extends into the peritoneum.  There is no additional cellulitis noted in the inner thighs bilaterally. Skin:    General: Skin is warm and dry.     Capillary Refill: Capillary refill takes less than 2 seconds.  Neurological:     Mental Status: She is alert and oriented to person, place, and time.  Psychiatric:        Mood and Affect: Mood normal.        Behavior: Behavior normal.    ED Results / Procedures / Treatments   Labs (all labs ordered are listed, but only abnormal results are displayed) Labs Reviewed  CBC WITH DIFFERENTIAL/PLATELET - Abnormal; Notable for the following components:      Result Value   RBC 5.19 (*)    Hemoglobin 11.6 (*)    MCV 74.8 (*)    MCH 22.4 (*)    MCHC 29.9 (*)    RDW 15.9 (*)    Lymphs Abs 4.5 (*)    Abs Immature Granulocytes 0.12 (*)    All other components within normal limits  BASIC METABOLIC PANEL - Abnormal; Notable for the following components:   Glucose, Bld 185 (*)    All other components within normal limits  URINALYSIS, ROUTINE W REFLEX MICROSCOPIC - Abnormal; Notable for the following components:   Protein, ur TRACE (*)    Leukocytes,Ua TRACE (*)    All other components within normal limits  URINE CULTURE    EKG None  Radiology CT PELVIS W CONTRAST  Result Date: 06/03/2021 CLINICAL DATA:  Dysuria, recent UTI EXAM: CT PELVIS WITH CONTRAST TECHNIQUE: Multidetector CT imaging of the pelvis was performed using the standard protocol following the bolus administration of intravenous contrast. RADIATION DOSE REDUCTION: This exam was performed according to the departmental dose-optimization  program which includes automated exposure control, adjustment of the mA and/or kV according to patient size and/or use of iterative reconstruction technique. CONTRAST:  157m OMNIPAQUE IOHEXOL 300 MG/ML  SOLN COMPARISON:  CT abdomen/pelvis dated 04/11/2016 FINDINGS: Urinary Tract:  Bladder is underdistended but unremarkable. Bowel: Visualized bowel is unremarkable, noting a normal appendix (series 2/image 14). Vascular/Lymphatic: No evidence of aneurysm. Atherosclerotic calcifications. No suspicious pelvic lymphadenopathy. Reproductive: 3.1 cm calcified intramural left uterine body fibroid (series 2/image 24). Bilateral ovaries  are within normal limits. Other:  No pelvic ascites. Small fat containing periumbilical hernia (series 2/image 27). No peritoneal fluid collection or mass is visualized. Musculoskeletal: Mild degenerative changes of the lower lumbar spine. IMPRESSION: Uterine fibroids. Otherwise negative CT pelvis. Electronically Signed   By: Julian Hy M.D.   On: 06/03/2021 20:09    Procedures Procedures    Medications Ordered in ED Medications  iohexol (OMNIPAQUE) 300 MG/ML solution 100 mL (100 mLs Intravenous Contrast Given 06/03/21 1954)  HYDROcodone-acetaminophen (NORCO/VICODIN) 5-325 MG per tablet 2 tablet (1 tablet Oral Given 06/03/21 2056)    ED Course/ Medical Decision Making/ A&P                           Medical Decision Making Amount and/or Complexity of Data Reviewed Labs: ordered. Radiology: ordered.  Risk Prescription drug management.   This patient presents to the ED for concern of difficulty urinating, pain with urination, vaginal pain, this involves an extensive number of treatment options, and is a complaint that carries with it a high risk of complications and morbidity. The emergent differential diagnosis includes, but is not limited to,  vulvovaginal abscess, UTI, pyelo, acute urinary obstruction, nephrolithiasis, ovarian torsion, tubo-ovarian abscess, versus  other.  This is not an exhaustive differential.   Co morbidities that complicate the patient evaluation: History of vaginal ulceration, vaginal pain, Paget's disease of the vulva, previous vulvar neoplasia, surgical history of vaginal ablation, fusion. Social Determinants of Health: Patient speaks a different language, 7 translation provided by patient's daughter who is a Designer, jewellery and feels comfortable with this.  Additional history obtained from patient's daughter. External records from outside source obtained and reviewed including outpatient OB/GYN visits.  Physical Exam: Physical exam performed. The pertinent findings include: patient was able to urinate, normal appearance of urethra, minimal clinical concern for acute obstruction of the urethra.  Patient with some irritation, swelling, drainage from the left lower labia.  Unable to perform formal speculum exam secondary to vulvar fusion.  I do not palpate a fluctuant abscess left labia.  There is some irritation extending into the perineum as well.  Lab Tests: I Ordered, and personally interpreted labs.  The pertinent results include: Elevated blood glucose 185.  CBC shows a mild microcytic anemia, hemoglobin 11.6, MCV 74.8.  She has no white count.  Urinalysis shows trace protein, trace leukocytes.  Urine culture is in progress.   Imaging Studies: I ordered imaging studies including CT pelvis with contrast. I independently visualized and interpreted imaging which showed uterine fibroids, no evidence of abscess or other fluid collection in the lower pelvis. I agree with the radiologist interpretation.   Based on my physical exam I see evidence of some vulvar irritation with the appearance of a secondary infection.  We will treat the skin and soft tissue infection with no evidence of abscess with Keflex for the next week.  Encourage close follow-up with OB/GYN for resolution of symptoms.  We will provide lidocaine cream to apply  topically to the affected area.  Encourage patient to return if symptoms worsen or fail to improve despite treatment.  Patient understands and agrees to plan, return precautions given.  Final Clinical Impression(s) / ED Diagnoses Final diagnoses:  Vulvar dermatitis  Cellulitis of other specified site    Rx / DC Orders ED Discharge Orders          Ordered    cephALEXin (KEFLEX) 500 MG capsule  4 times daily  06/03/21 2047    lidocaine (XYLOCAINE) 5 % ointment  As needed        06/03/21 2047    HYDROcodone-acetaminophen (NORCO/VICODIN) 5-325 MG tablet  Every 6 hours PRN        06/03/21 2047              Dorien Chihuahua 06/03/21 2122    Luna Fuse, MD 06/08/21 [phone]

## 2021-06-03 NOTE — ED Triage Notes (Signed)
Pt came in c/o of having urination trouble x3 days. Pt describes it feels like something is blocking her urine form coming out. Pt was just treated for a UTI in December. Pt does state when the urine does come out it does burn and she feels that the vaginal area is sore.

## 2021-06-05 LAB — URINE CULTURE: Culture: <10000 — AB

## 2021-06-29 ENCOUNTER — Ambulatory Visit (INDEPENDENT_AMBULATORY_CARE_PROVIDER_SITE_OTHER): Payer: 59 | Admitting: Physician Assistant

## 2021-06-29 ENCOUNTER — Encounter: Payer: Self-pay | Admitting: Physician Assistant

## 2021-06-29 VITALS — BP 120/72 | HR 107 | Temp 97.6°F | Ht 58.27 in | Wt 164.8 lb

## 2021-06-29 DIAGNOSIS — E1165 Type 2 diabetes mellitus with hyperglycemia: Secondary | ICD-10-CM | POA: Diagnosis not present

## 2021-06-29 DIAGNOSIS — D071 Carcinoma in situ of vulva: Secondary | ICD-10-CM

## 2021-06-29 DIAGNOSIS — R102 Pelvic and perineal pain: Secondary | ICD-10-CM

## 2021-06-29 DIAGNOSIS — I10 Essential (primary) hypertension: Secondary | ICD-10-CM

## 2021-06-29 DIAGNOSIS — C519 Malignant neoplasm of vulva, unspecified: Secondary | ICD-10-CM

## 2021-06-29 DIAGNOSIS — E1169 Type 2 diabetes mellitus with other specified complication: Secondary | ICD-10-CM

## 2021-06-29 LAB — POCT GLYCOSYLATED HEMOGLOBIN (HGB A1C): Hemoglobin A1C: 5.9 % — AB (ref 4.0–5.6)

## 2021-06-29 MED ORDER — BLOOD GLUCOSE MONITOR KIT: PACK | 0 refills | Status: DC

## 2021-06-29 MED ORDER — GABAPENTIN 300 MG PO CAPS: 300.0000 mg | ORAL_CAPSULE | Freq: Every day | ORAL | 2 refills | Status: DC

## 2021-06-29 MED ORDER — ATORVASTATIN CALCIUM 40 MG PO TABS: 40.0000 mg | ORAL_TABLET | Freq: Every day | ORAL | 3 refills | Status: DC

## 2021-06-29 NOTE — Patient Instructions (Signed)
Very good to meet you today! ?Your Ha1c was 5.9 - this is stable. Keep up good work. ?I have sent a referral to GYN Dr. Denman George. You may call for appt as well. ? ?Trial Gabapentin for pain at bedtime. Let me know in a few weeks how this is working for you. ? ?Call sooner if any concerns.  ?

## 2021-06-29 NOTE — Progress Notes (Signed)
? ?Subjective:  ? ? Patient ID: Ellen Ryan, female    DOB: May 15, 1955, 66 y.o.   MRN: [id number] ? ?Chief Complaint  ?Patient presents with  ? Establish Care  ? Vaginal Pain  ? ? ?HPI ?66 y.o. patient presents today for new patient establishment with me.  Patient was previously established with Dr. Margarita Rana, but insurance changed. ? ?Current Care Team: ?Dr. Polly Cobia - Vulvar dysplasia  ? ?Acute Concerns: ?Needs A1c rechecked ? ?Chronic Concerns: ?See PMH listed below, as well as A/P for details on issues we specifically discussed during today's visit.   ? ? ? ?Past Medical History:  ?Diagnosis Date  ? Aortic valve stenosis   ? per echo 01-16-2017  very mild AV stenosis w/ valve area 1.85cm^2  ? Arthritis   ? both knees  ? Disc degeneration, lumbar   ? L4-L5  ? Grade I diastolic dysfunction 95/28/4132  ? Noted on ECHO  ? History of dysplasia of vulva   ? 09-21-2017  VIN3  ? Hyperlipidemia   ? Hypertension   ? 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[place])   ? extramammary-- s/p complete vulvectomy 09-21-2016  ? Type 2 diabetes mellitus ([place]) 12/27/2016  ? Uterine fibroid   ? 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 ? ?Past Surgical History:  ?Procedure Laterality Date  ? CO2 LASER APPLICATION N/A 4/40/1027  ? Procedure: CO2 LASER APPLICATION OF THE VULVA;  Surgeon: Everitt Amber, MD;  Location: [facility];  Service: Gynecology;  Laterality: N/A;  ? EUA/ VULVA BIOPSY  09-01-2016  [name]  [facility] [address]  ? RADICAL VULVECTOMY N/A 09/21/2016  ? Procedure: RADICAL VULVECTOMY COMPLETE;  Surgeon: Everitt Amber, MD;  Location: [facility];  Service: Gynecology;  Laterality: N/A;  ? TRANSTHORACIC ECHOCARDIOGRAM  01/16/2017  ? ef 60-65%,  grade 1 diastolic dysfunction/  very mild AV stenosis with moderately thickened and calcified leaflets (valve area 1.85cm^2)/  moderate LAE  ? VULVA /PERINEUM BIOPSY N/A 09/13/2017  ? Procedure: VULVAR BIOPSY;  Surgeon: Everitt Amber, MD;  Location: [facility];  Service:  Gynecology;  Laterality: N/A;  ? ? ?Family History  ?Problem Relation Age of Onset  ? Stroke Mother   ? Arthritis Mother   ? Diabetes Father   ? Cancer Daughter   ? ? ?Social History  ? ?Tobacco Use  ? Smoking status: Never  ? Smokeless tobacco: Never  ?Vaping Use  ? Vaping Use: Never used  ?Substance Use Topics  ? Alcohol use: Not Currently  ? Drug use: No  ?  ? ?No Known Allergies ? ?Review of Systems ?NEGATIVE UNLESS OTHERWISE INDICATED IN HPI ? ? ?   ?Objective:  ?  ? ?BP 120/72   Pulse (!) 107   Temp 97.6 ?F (36.4 ?C) (Temporal)   Ht 4' 10.27" (1.48 m)   Wt 164 lb 12.8 oz (74.8 kg)   SpO2 97%   BMI 34.13 kg/m?  ? ?Wt Readings from Last 3 Encounters:  ?06/29/21 164 lb 12.8 oz (74.8 kg)  ?06/03/21 170 lb (77.1 kg)  ?11/15/18 173 lb (78.5 kg)  ? ? ?BP Readings from Last 3 Encounters:  ?06/29/21 120/72  ?06/03/21 132/74  ?11/15/18 (!) 164/100  ?  ? ?Physical Exam ?Vitals and nursing note reviewed.  ?Constitutional:   ?   Appearance: Normal appearance. She is normal weight. She is not toxic-appearing.  ?HENT:  ?   Head: Normocephalic and atraumatic.  ?   Right Ear: External ear normal.  ?  Left Ear: External ear normal.  ?   Nose: Nose normal.  ?   Mouth/Throat:  ?   Mouth: Mucous membranes are moist.  ?Eyes:  ?   Extraocular Movements: Extraocular movements intact.  ?   Conjunctiva/sclera: Conjunctivae normal.  ?   Pupils: Pupils are equal, round, and reactive to light.  ?Cardiovascular:  ?   Rate and Rhythm: Normal rate and regular rhythm.  ?   Pulses: Normal pulses.  ?   Heart sounds: Normal heart sounds.  ?Pulmonary:  ?   Effort: Pulmonary effort is normal.  ?   Breath sounds: Normal breath sounds.  ?Musculoskeletal:     ?   General: Normal range of motion.  ?   Cervical back: Normal range of motion and neck supple.  ?Skin: ?   General: Skin is warm and dry.  ?Neurological:  ?   General: No focal deficit present.  ?   Mental Status: She is alert and oriented to person, place, and time.  ?Psychiatric:      ?   Mood and Affect: Mood normal.     ?   Behavior: Behavior normal.     ?   Thought Content: Thought content normal.     ?   Judgment: Judgment normal.  ? ? ?   ?Assessment & Plan:  ? ?Problem List Items Addressed This Visit   ? ?  ? Cardiovascular and Mediastinum  ? Essential hypertension  ? Relevant Medications  ? atorvastatin (LIPITOR) 40 MG tablet  ?  ? Musculoskeletal and Integument  ? 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[place])  ? Relevant Orders  ? Ambulatory referral to Gynecology  ?  ? Genitourinary  ? VIN III (vulvar intraepithelial neoplasia III) - Primary  ? Relevant Orders  ? Ambulatory referral to Gynecology  ?  ? Other  ? Vaginal pain  ? Relevant Orders  ? Ambulatory referral to Gynecology  ? ?Other Visit Diagnoses   ? ? Type 2 diabetes mellitus with hyperglycemia, without long-term current use of insulin ([place])      ? Relevant Medications  ? atorvastatin (LIPITOR) 40 MG tablet  ? blood glucose meter kit and supplies KIT  ? Other Relevant Orders  ? POCT glycosylated hemoglobin (Hb A1C) (Completed)  ? Hyperlipidemia associated with type 2 diabetes mellitus ([place])      ? Relevant Medications  ? atorvastatin (LIPITOR) 40 MG tablet  ? blood glucose meter kit and supplies KIT  ? Dyslipidemia associated with type 2 diabetes mellitus ([place])      ? Relevant Medications  ? atorvastatin (LIPITOR) 40 MG tablet  ? ?  ? ? ? ?Meds ordered this encounter  ?Medications  ? gabapentin (NEURONTIN) 300 MG capsule  ?  Sig: Take 1 capsule (300 mg total) by mouth at bedtime.  ?  Dispense:  30 capsule  ?  Refill:  2  ? atorvastatin (LIPITOR) 40 MG tablet  ?  Sig: Take 1 tablet (40 mg total) by mouth daily.  ?  Dispense:  90 tablet  ?  Refill:  3  ? blood glucose meter kit and supplies KIT  ?  Sig: Dispense based on patient and insurance preference. Use up to four times daily as directed.  ?  Dispense:  1 each  ?  Refill:  0  ?  Order Specific Question:   Number of strips  ?  Answer:   100  ?  Order Specific Question:   Number of lancets   ?  Answer:   100  ? ? ?Plan: ?-New referral to GYN Dr. Denman George sent today for hx of chronic vulvar pain ?-Trial Gabapentin 300 mg at bedtime to help with pain; Pt aware of risks vs benefits and possible adverse reactions. Daughter to MyChart message in a few weeks with update. ?-10/27/19 - Ha1c was 5.9; today is 5.9; stable; renewed Rx for new glucometer and testing supplies ?-Refilled Lipitor 40 mg ?-BP is stable  ? ?F/up 3 months for fasting labs and recheck or prn  ? ?Shakora Nordquist M Worthington Cruzan, PA-C ?

## 2021-06-30 ENCOUNTER — Ambulatory Visit: Payer: Self-pay | Admitting: Physician Assistant

## 2021-06-30 ENCOUNTER — Telehealth: Payer: Self-pay | Admitting: *Deleted

## 2021-06-30 NOTE — Telephone Encounter (Signed)
Called and spoke with the patient's daughter and scheduled a follow up with Dr Delsa Sale on 3/16 ?

## 2021-07-06 ENCOUNTER — Encounter: Payer: Self-pay | Admitting: Obstetrics & Gynecology

## 2021-07-07 ENCOUNTER — Other Ambulatory Visit: Payer: Self-pay

## 2021-07-07 ENCOUNTER — Inpatient Hospital Stay: Payer: 59 | Admitting: Obstetrics & Gynecology

## 2021-07-07 VITALS — BP 148/73 | HR 108 | Temp 98.2°F | Resp 20 | Wt 168.0 lb

## 2021-07-07 DIAGNOSIS — C519 Malignant neoplasm of vulva, unspecified: Secondary | ICD-10-CM

## 2021-07-11 ENCOUNTER — Encounter: Payer: Self-pay | Admitting: Physician Assistant

## 2021-07-12 ENCOUNTER — Telehealth: Payer: Self-pay | Admitting: Physician Assistant

## 2021-07-12 NOTE — Telephone Encounter (Signed)
Pt's daughter states pt has been doing well with taking 2 gabapentin a day with no side effects. She is going to try taking a third capsule tomorrow to see how she can handle it.  ?

## 2021-07-13 NOTE — Telephone Encounter (Signed)
Pt's daughter called in to state pt is doing well with taking 3 doses a day of the gabapentin. Daughter and pt are wanting to continue the 3 doses. Please advise ?

## 2021-07-13 NOTE — Telephone Encounter (Signed)
Noted and agreed, thank you. 

## 2021-07-14 ENCOUNTER — Other Ambulatory Visit: Payer: Self-pay

## 2021-07-14 MED ORDER — GABAPENTIN 300 MG PO CAPS: 300.0000 mg | ORAL_CAPSULE | Freq: Three times a day (TID) | ORAL | 1 refills | Status: DC

## 2021-07-14 NOTE — Telephone Encounter (Signed)
Noted.  Rx sent.

## 2021-07-24 ENCOUNTER — Other Ambulatory Visit: Payer: Self-pay | Admitting: Physician Assistant

## 2021-08-17 ENCOUNTER — Inpatient Hospital Stay (HOSPITAL_BASED_OUTPATIENT_CLINIC_OR_DEPARTMENT_OTHER): Payer: 59 | Admitting: Gynecologic Oncology

## 2021-08-17 ENCOUNTER — Encounter: Payer: Self-pay | Admitting: Obstetrics & Gynecology

## 2021-08-17 ENCOUNTER — Other Ambulatory Visit: Payer: Self-pay

## 2021-08-17 ENCOUNTER — Inpatient Hospital Stay: Payer: 59 | Attending: Obstetrics & Gynecology | Admitting: Obstetrics & Gynecology

## 2021-08-17 DIAGNOSIS — N9089 Other specified noninflammatory disorders of vulva and perineum: Secondary | ICD-10-CM

## 2021-08-17 DIAGNOSIS — C519 Malignant neoplasm of vulva, unspecified: Secondary | ICD-10-CM

## 2021-08-17 NOTE — Patient Instructions (Signed)
Preparing for your Surgery ? ?Plan for surgery on Aug 31, 2021 with Dr. Lahoma Crocker at [facility]. You will be scheduled for an examination under anesthesia with vulvar biopsies.  ? ?Pre-operative Testing ?-You will receive a phone call from presurgical testing at [facility] to discuss surgery instructions and arrange for lab work if needed. ? ?-Bring your insurance card, copy of an advanced directive if applicable, medication list. ? ?-You should not be taking blood thinners or aspirin at least ten days prior to surgery unless instructed by your surgeon. ? ?-Do not take supplements such as fish oil (omega 3), red yeast rice, turmeric before your surgery. You want to avoid medications with aspirin in them including headache powders such as BC or Goody's), Excedrin migraine. ? ?Day Before Surgery at Home ?-You will be advised you can have clear liquids up until 3 hours before your surgery.   ? ?Your role in recovery ?Your role is to become active as soon as directed by your doctor, while still giving yourself time to heal.  Rest when you feel tired. You will be asked to do the following in order to speed your recovery: ? ?- Cough and breathe deeply. This helps to clear and expand your lungs and can prevent pneumonia after surgery.  ?- STAY ACTIVE WHEN YOU GET HOME. Do mild physical activity. Walking or moving your legs help your circulation and body functions return to normal. Do not try to get up or walk alone the first time after surgery.   ?-If you develop swelling on one leg or the other, pain in the back of your leg, redness/warmth in one of your legs, please call the office or go to the Emergency Room to have a doppler to rule out a blood clot. For shortness of breath, chest pain-seek care in the Emergency Room as soon as possible. ?- Actively manage your pain. Managing your pain lets you move in comfort. We will ask you to rate your pain on a scale of zero to 10. It is  your responsibility to tell your doctor or nurse where and how much you hurt so your pain can be treated. ? ?Special Considerations ?-Your final pathology results from surgery should be available around one week after surgery and the results will be relayed to you when available. ? ?-FMLA forms can be faxed to [phone] and please allow 5-7 business days for completion. ? ?Pain Management After Surgery ?-Make sure that you have Tylenol and Ibuprofen IF YOU ARE ABLE TO TAKE THESE MEDICATIONS at home to use on a regular basis after surgery for pain control. We recommend alternating the medications every hour to six hours since they work differently and are processed in the body differently for pain relief. ? ?-Review the attached handout on narcotic use and their risks and side effects.  ? ?Bowel Regimen ?-It is important to prevent constipation and drink adequate amounts of liquids.  ? ?Risks of Surgery ?Risks of surgery are low but include bleeding, infection, damage to surrounding structures, re-operation, blood clots, and very rarely death. ? ?AFTER SURGERY INSTRUCTIONS ? ?Return to work:  1-2 weeks if applicable ? ?Activity: ?1. Be up and out of the bed during the day.  Take a nap if needed.  You may walk up steps but be careful and use the hand rail.  Stair climbing will tire you more than you think, you may need to stop part way and rest.  ? ?2. No  lifting or straining for 2 weeks over 10 pounds. No pushing, pulling, straining for 2 weeks. ? ?3. No driving for minimum 24 HOURS after surgery.  Do not drive if you are taking narcotic pain medicine and make sure that your reaction time has returned.  ? ?4. You can shower as soon as the next day after surgery. Shower daily. No tub baths or submerging your body in water until cleared by your surgeon. If you have the soap that was given to you by pre-surgical testing that was used before surgery, you do not need to use it afterwards because this can irritate your  incisions.  ? ?5. No sexual activity and nothing in the vagina for 4 weeks. ? ?6. You may experience vaginal spotting and discharge after surgery.  The spotting is normal but if you experience heavy bleeding, call our office. ? ?7. Take Tylenol or ibuprofen first for pain and only use narcotic pain medication for severe pain not relieved by the Tylenol or Ibuprofen.  Monitor your Tylenol intake to a max of 4,000 mg in a 24 hour period. You can alternate these medications after surgery. ? ?Diet: ?1. Low sodium Heart Healthy Diet is recommended but you are cleared to resume your normal (before surgery) diet after your procedure. ? ?2. It is safe to use a laxative, such as Miralax or Colace, if you have difficulty moving your bowels. You have been prescribed Sennakot at bedtime every evening to keep bowel movements regular and to prevent constipation.   ? ?Wound Care: ?1. Keep clean and dry.  Shower daily. ? ?Reasons to call the Doctor: ?Fever - Oral temperature greater than 100.4 degrees Fahrenheit ?Foul-smelling vaginal discharge ?Difficulty urinating ?Nausea and vomiting ?Increased pain at the site of the incision that is unrelieved with pain medicine. ?Difficulty breathing with or without chest pain ?New calf pain especially if only on one side ?Sudden, continuing increased vaginal bleeding with or without clots. ?  ?Contacts: ?For questions or concerns you should contact: ? ?Dr. Lahoma Crocker at [phone] ? ?Joylene John, NP at [phone] ? ?After Hours: call [phone] and have the GYN Oncologist paged/contacted (after 5 pm or on the weekends). ? ?Messages sent via mychart are for non-urgent matters and are not responded to after hours so for urgent needs, please call the after hours number. ? ? ? ?  ?

## 2021-08-17 NOTE — Assessment & Plan Note (Addendum)
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  Exam for possible recurrence of Paget disease, neoplastic process.  The pt's pain precludes adequate exam with biopsies in the office. ?? ?>Plan to proceed with EUA with biopsies. Procedure, risks, reasons, benefits and complications (including bleeding, infection, hematoma) were reviewed in detail.  Instructions were reviewed, including NPO after midnight.   ?

## 2021-08-17 NOTE — Progress Notes (Signed)
Follow Up Note: Gyn-Onc ? ?Ellen Ryan 66 y.o. female ? ?CC: She presents for a f/u visit ? ? ?HPI: Ellen Ryan is 66 year old female initially seen at the request of Dr Ellen Ryan for extramammary pagets disease.  The patient is visiting her daughter now and lives in [place]. She reported a 2 year history of vulvar ulceration. She was seen by Dr Ellen Ryan for this problem in April, 2018 and was taken to the OR for an examination under anesthesia on 09/01/16. Extensive ulceration was present. ?This was biopsied and returned as extramammary pagets disease.   ?  ?On 09/21/16, she underwent a complete simple vulvectomy.  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 No invasive carcinoma, negative margins. Post-operative course was uneventful and she was discharged with a foley catheter for one week post-op. ?  ?Given that she did not tolerate an office examination she was taken to the operating room on Sep 13, 2017 for examination under anesthesia and vulvar biopsies.  Intraoperative findings were significant for subtle acetowhite changes immediately lateral on the right side of the urethral meatus.  There also acetowhite changes to the midline distal vagina.  Vagina was completely agglutinated.  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  The posterior vaginal introitus biopsy revealed negative benign normal squamous mucosa. ?  ?On 10/19/17 she underwent CO2 laser to the urethral meatus ? ?Interval History: She was prescribed a 3 month course of topical clobetasol at the last visit.  She recently traveled to [place].  Since her return, has the sensation of "skin crawling".  She denies any concomitant sxs. No prior similar episodes.  She self-treated for possible pinworms w/o symptomatic improvement.   ? ?Review of Systems  ?Review of Systems  ?Constitutional:  Negative for malaise/fatigue and weight loss.  ?Respiratory:  Negative for shortness of breath and wheezing.   ?Cardiovascular:   Negative for chest pain and leg swelling.  ?Gastrointestinal:  Negative for abdominal pain, blood in stool, constipation, nausea and vomiting.  ?Genitourinary:  Negative for dysuria, frequency, hematuria and urgency.  ?Musculoskeletal:  Negative for joint pain and myalgias.  ?Neurological:  Negative for weakness.  ?Psychiatric/Behavioral:  Negative for depression. The patient does not have insomnia.   ? ?Current medications, allergy, social history, past surgical history, past medical history, family history were all reviewed. ? ? ? ?Vitals:  BP (!) 151/73 (BP Location: Left Arm, Patient Position: Sitting)   Pulse (!) 111   Temp 98.7 ?F (37.1 ?C) (Tympanic)   Resp 18   Ht '4\' 10"'$  (1.473 m)   Wt 169 lb 3.2 oz (76.7 kg)   SpO2 95%   BMI 35.36 kg/m?   ? ?Physical Exam:  ?Physical Exam ?Exam conducted with a chaperone present.  ?Constitutional:   ?   General: She is not in acute distress. ?Cardiovascular:  ?   Rate and Rhythm: Normal rate and regular rhythm.  ?Pulmonary:  ?   Effort: Pulmonary effort is normal.  ?   Breath sounds: Normal breath sounds. No wheezing or rhonchi.  ?Abdominal:  ?   Palpations: Abdomen is soft.  ?   Tenderness: There is no abdominal tenderness. There is no right CVA tenderness or left CVA tenderness.  ?   Hernia: No hernia is present.  ?Genitourinary: ?   General: Normal vulva. Limited exam 2/2 pt discomfort. B/L perineal, kissing ulcerative lesions ?   Musculoskeletal:  ?   Cervical back: Neck supple.  ?  Right lower leg: No edema.  ?   Left lower leg: No edema.  ?Lymphadenopathy:  ?   Upper Body:  ?   Right upper body: No supraclavicular adenopathy.  ?   Left upper body: No supraclavicular adenopathy.  ?   Lower Body: No right inguinal adenopathy. No left inguinal adenopathy.  ?Skin: ?   Findings: No rash.  ?Neurological:  ?   Mental Status: She is oriented to person, place, and time.  ? 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  Exam for possible recurrence of Paget disease, neoplastic process.  The pt's pain precludes adequate exam with biopsies in the office. ?  ?>Plan to proceed with EUA with biopsies. Procedure, risks, reasons, benefits and complications (including bleeding, infection, hematoma) were reviewed in detail.  Instructions were reviewed, including NPO after midnight.    ? ? ?Lahoma Crocker, MD  ?

## 2021-08-29 ENCOUNTER — Encounter (HOSPITAL_BASED_OUTPATIENT_CLINIC_OR_DEPARTMENT_OTHER): Payer: Self-pay | Admitting: Obstetrics & Gynecology

## 2021-08-29 ENCOUNTER — Other Ambulatory Visit: Payer: Self-pay

## 2021-08-29 NOTE — Progress Notes (Signed)
Spoke w/ via phone for pre-op interview: daughter, Loyal Gambler ?Lab results: Echo 01/16/17 EF 60-65% ?COVID test: patient states asymptomatic no test needed. ?Arrive at [facility] 08/31/21 ?NPO after MN except clear liquids.Clear liquids from MN until 0530 ?Med rec completed. ?Medications to take morning of surgery: gabapentin and amlodipine ?Diabetic medication: do not take Metformin morning of sx prior to sx ?Patient instructed no nail polish to be worn day of surgery. ?Patient instructed to bring photo id and insurance card day of surgery. ?Patient aware to have Driver (ride ) / caregiver for 24 hours after surgery. (Daughter, Loyal Gambler to drive) ?Patient Special Instructions: NA ?Pre-Op special Istructions: patient's primary language is Igbo; daughter states that patient does understand English pretty well but patient stated she could not understand me so I will place interpreter request ?Patient verbalized understanding of instructions that were given at this phone interview. ?Patient denies shortness of breath, chest pain, fever, cough at this phone interview.  ?

## 2021-08-29 NOTE — Progress Notes (Signed)
Interpreter request denied d/t no Igbo interpreters available. Please use video remote interpreter or Language Line (Ph # [phone]) as needed. ?

## 2021-08-30 ENCOUNTER — Telehealth: Payer: Self-pay | Admitting: *Deleted

## 2021-08-30 ENCOUNTER — Encounter (HOSPITAL_BASED_OUTPATIENT_CLINIC_OR_DEPARTMENT_OTHER): Payer: Self-pay | Admitting: Obstetrics & Gynecology

## 2021-08-30 NOTE — H&P (Signed)
Subjective:  ? ? Patient is a 66 y.o. female scheduled for EUA with biopsies.  ? 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  Exam for possible recurrence of Paget disease, neoplastic process.  She reports onset sensation of "skin crawling" on the vulva e 3 mos ago.   ? ?Pertinent Gynecological History: ?See above ? ? ?Discussed Blood/Blood Products: not applicable ? ?  ? ?The following portions of the patient's history were reviewed and updated as appropriate: allergies, current medications, past family history, past medical history, past social history, past surgical history, and problem list. ? ?Review of Systems ? Review of Systems  ?Constitutional:  Negative for malaise/fatigue and weight loss.  ?Respiratory:  Negative for shortness of breath and wheezing.   ?Cardiovascular:  Negative for chest pain and leg swelling.  ?Gastrointestinal:  Negative for abdominal pain, blood in stool, constipation, nausea and vomiting.  ?Genitourinary:  Negative for dysuria, frequency, hematuria and urgency.  ?Musculoskeletal:  Negative for joint pain and myalgias.  ?Neurological:  Negative for weakness.  ?Psychiatric/Behavioral:  Negative for depression. The patient does not have insomnia.   ?  ?Objective:  ? ? Ht '4\' 10"'$  (1.473 m)   Wt 76.7 kg   BMI 35.34 kg/m?  ?Physical Exam ?Exam conducted with a chaperone present.  ?Constitutional:   ?   General: She is not in acute distress. ?Cardiovascular:  ?   Rate and Rhythm: Normal rate and regular rhythm.  ?Pulmonary:  ?   Effort: Pulmonary effort is normal.  ?   Breath sounds: Normal breath sounds. No wheezing or rhonchi.  ?Abdominal:  ?   Palpations: Abdomen is soft.  ?   Tenderness: There is no abdominal tenderness. There is no right CVA tenderness or left CVA tenderness.  ?   Hernia: No hernia is present.  ?Genitourinary: ?    General: Normal vulva. Limited exam 2/2 pt discomfort. B/L perineal, kissing ulcerative lesions ?   Musculoskeletal:  ?   Cervical back: Neck supple.  ?   Right lower leg: No edema.  ?   Left lower leg: No edema.  ?Lymphadenopathy:  ?   Upper Body:  ?   Right upper body: No supraclavicular adenopathy.  ?   Left upper body: No supraclavicular adenopathy.  ?   Lower Body: No right inguinal adenopathy. No left inguinal adenopathy.  ?Skin: ?   Findings: No rash.  ?Neurological:  ?   Mental Status: She is oriented to person, place, and time. ?  ? ?Assessment:  ?  ? 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  Exam for possible recurrence of Paget disease, neoplastic process.  The pt's pain precludes adequate exam with biopsies in the office.    ?  ?Plan:  ?Plan to proceed with EUA with biopsies. Procedure, risks, reasons, benefits and complications (including bleeding, infection, hematoma) were reviewed in detail.  Instructions were reviewed, including NPO after midnight ? ?   ?

## 2021-08-30 NOTE — Telephone Encounter (Signed)
Telephone call to check on pre-operative status.  Patient's daughter compliant with pre-operative instructions.  Reinforced nothing to eat after midnight. Clear liquids until 0530. Patient to arrive at 0630.  No questions or concerns voiced.  Instructed to call for any needs. Pt's daughter stated that pt complains of something biting her in the uterus/pelvic area. It's random at time in different places on the inside of her lower abdomen area. Denies any bleeding. Joylene John, NP notified.  ?

## 2021-08-30 NOTE — Anesthesia Preprocedure Evaluation (Addendum)
Anesthesia Evaluation  ?Patient identified by MRN, date of birth, ID band ?Patient awake ? ? ? ?Reviewed: ?Allergy & Precautions, NPO status , Patient's Chart, lab work & pertinent test results ? ?Airway ?Mallampati: II ? ?TM Distance: >3 FB ?Neck ROM: Full ? ? ? Dental ? ?(+) Chipped, Dental Advisory Given,  ?  ?Pulmonary ?neg pulmonary ROS,  ?  ?Pulmonary exam normal ? ? ? ? ? ? ? Cardiovascular ?hypertension, Pt. on medications ? ?Rhythm:Regular Rate:Normal ? ? ?  ?Neuro/Psych ?negative neurological ROS ? negative psych ROS  ? GI/Hepatic ?negative GI ROS, Neg liver ROS,   ?Endo/Other  ?diabetes, Type 2, Oral Hypoglycemic Agents ? Renal/GU ?negative Renal ROS  ? ?  ?Musculoskeletal ? ?(+) Arthritis ,  ? Abdominal ?Normal abdominal exam  (+)   ?Peds ? Hematology ?negative hematology ROS ?(+)   ?Anesthesia Other Findings ? ? Reproductive/Obstetrics ? ?  ? ? ? ? ? ? ? ? ? ? ? ? ? ?  ?  ? ? ? ? ? ? ? ?Anesthesia Physical ?Anesthesia Plan ? ?ASA: 2 ? ?Anesthesia Plan: General  ? ?Post-op Pain Management:   ? ?Induction: Intravenous ? ?PONV Risk Score and Plan: 4 or greater and Ondansetron, Midazolam, Dexamethasone and Treatment may vary due to age or medical condition ? ?Airway Management Planned: LMA ? ?Additional Equipment: None ? ?Intra-op Plan:  ? ?Post-operative Plan: Extubation in OR ? ?Informed Consent: I have reviewed the patients History and Physical, chart, labs and discussed the procedure including the risks, benefits and alternatives for the proposed anesthesia with the patient or authorized representative who has indicated his/her understanding and acceptance.  ? ? ? ?Dental advisory given ? ?Plan Discussed with: CRNA ? ?Anesthesia Plan Comments:   ? ? ? ? ? ?Anesthesia Quick Evaluation ? ?

## 2021-08-31 ENCOUNTER — Other Ambulatory Visit: Payer: Self-pay

## 2021-08-31 ENCOUNTER — Ambulatory Visit (HOSPITAL_BASED_OUTPATIENT_CLINIC_OR_DEPARTMENT_OTHER): Payer: 59 | Admitting: Anesthesiology

## 2021-08-31 ENCOUNTER — Encounter (HOSPITAL_BASED_OUTPATIENT_CLINIC_OR_DEPARTMENT_OTHER)
Admission: RE | Disposition: A | Discharge status: Home / Self Care | Payer: Self-pay | Source: Home / Self Care | Attending: Obstetrics & Gynecology

## 2021-08-31 ENCOUNTER — Encounter (HOSPITAL_BASED_OUTPATIENT_CLINIC_OR_DEPARTMENT_OTHER): Payer: Self-pay | Admitting: Obstetrics & Gynecology

## 2021-08-31 ENCOUNTER — Ambulatory Visit (HOSPITAL_BASED_OUTPATIENT_CLINIC_OR_DEPARTMENT_OTHER)
Admission: RE | Admit: 2021-08-31 | Discharge: 2021-08-31 | Disposition: A | Discharge status: Home / Self Care | Payer: 59 | Attending: Obstetrics & Gynecology | Admitting: Obstetrics & Gynecology

## 2021-08-31 DIAGNOSIS — N9089 Other specified noninflammatory disorders of vulva and perineum: Secondary | ICD-10-CM | POA: Diagnosis not present

## 2021-08-31 DIAGNOSIS — C519 Malignant neoplasm of vulva, unspecified: Secondary | ICD-10-CM

## 2021-08-31 DIAGNOSIS — E119 Type 2 diabetes mellitus without complications: Secondary | ICD-10-CM | POA: Diagnosis not present

## 2021-08-31 DIAGNOSIS — I10 Essential (primary) hypertension: Secondary | ICD-10-CM | POA: Diagnosis not present

## 2021-08-31 DIAGNOSIS — D071 Carcinoma in situ of vulva: Secondary | ICD-10-CM | POA: Insufficient documentation

## 2021-08-31 DIAGNOSIS — Z7984 Long term (current) use of oral hypoglycemic drugs: Secondary | ICD-10-CM | POA: Insufficient documentation

## 2021-08-31 HISTORY — PX: VULVA /PERINEUM BIOPSY: SHX319

## 2021-08-31 LAB — POCT I-STAT, CHEM 8
BUN: 6 mg/dL — ABNORMAL LOW (ref 8–23)
Calcium, Ion: 1.12 mmol/L — ABNORMAL LOW (ref 1.15–1.40)
Chloride: 101 mmol/L (ref 98–111)
Creatinine, Ser: 0.4 mg/dL — ABNORMAL LOW (ref 0.44–1.00)
Glucose, Bld: 147 mg/dL — ABNORMAL HIGH (ref 70–99)
HCT: 38 % (ref 36.0–46.0)
Hemoglobin: 12.9 g/dL (ref 12.0–15.0)
Potassium: 3.6 mmol/L (ref 3.5–5.1)
Sodium: 140 mmol/L (ref 135–145)
TCO2: 26 mmol/L (ref 22–32)

## 2021-08-31 LAB — GLUCOSE, CAPILLARY: Glucose-Capillary: 113 mg/dL — ABNORMAL HIGH (ref 70–99)

## 2021-08-31 SURGERY — EXAM UNDER ANESTHESIA
Anesthesia: General | Site: Vulva

## 2021-08-31 MED ORDER — PHENYLEPHRINE 80 MCG/ML (10ML) SYRINGE FOR IV PUSH (FOR BLOOD PRESSURE SUPPORT)
PREFILLED_SYRINGE | INTRAVENOUS | Status: DC | PRN
  Administered 2021-08-31 (×5): 80 ug via INTRAVENOUS

## 2021-08-31 MED ORDER — KETOROLAC TROMETHAMINE 30 MG/ML IJ SOLN
INTRAMUSCULAR | Status: DC | PRN
  Administered 2021-08-31: 30 mg via INTRAVENOUS

## 2021-08-31 MED ORDER — LACTATED RINGERS IV SOLN
INTRAVENOUS | Status: DC
  Administered 2021-08-31: 1000 mL via INTRAVENOUS

## 2021-08-31 MED ORDER — FENTANYL CITRATE (PF) 100 MCG/2ML IJ SOLN
INTRAMUSCULAR | Status: DC | PRN
  Administered 2021-08-31 (×2): 50 ug via INTRAVENOUS

## 2021-08-31 MED ORDER — PROPOFOL 10 MG/ML IV BOLUS
INTRAVENOUS | Status: AC
  Filled 2021-08-31: qty 20

## 2021-08-31 MED ORDER — OXYCODONE HCL 5 MG PO TABS: 5.0000 mg | ORAL_TABLET | ORAL | 0 refills | Status: DC | PRN

## 2021-08-31 MED ORDER — LIDOCAINE HCL (PF) 2 % IJ SOLN
INTRAMUSCULAR | Status: AC
  Filled 2021-08-31: qty 5

## 2021-08-31 MED ORDER — ONDANSETRON HCL 4 MG/2ML IJ SOLN
INTRAMUSCULAR | Status: AC
  Filled 2021-08-31: qty 2

## 2021-08-31 MED ORDER — ACETAMINOPHEN 500 MG PO TABS
ORAL_TABLET | ORAL | Status: AC
  Filled 2021-08-31: qty 2

## 2021-08-31 MED ORDER — FENTANYL CITRATE (PF) 250 MCG/5ML IJ SOLN
INTRAMUSCULAR | Status: AC
  Filled 2021-08-31: qty 5

## 2021-08-31 MED ORDER — DEXAMETHASONE SODIUM PHOSPHATE 10 MG/ML IJ SOLN
INTRAMUSCULAR | Status: DC | PRN
Start: 2021-08-31 — End: 2021-08-31

## 2021-08-31 MED ORDER — ACETAMINOPHEN 500 MG PO TABS
1000.0000 mg | ORAL_TABLET | ORAL | Status: AC
  Administered 2021-08-31: 1000 mg via ORAL

## 2021-08-31 MED ORDER — MIDAZOLAM HCL 2 MG/2ML IJ SOLN
INTRAMUSCULAR | Status: DC | PRN
  Administered 2021-08-31 (×2): 1 mg via INTRAVENOUS

## 2021-08-31 MED ORDER — 0.9 % SODIUM CHLORIDE (POUR BTL) OPTIME
TOPICAL | Status: DC | PRN
  Administered 2021-08-31: 500 mL

## 2021-08-31 MED ORDER — LIDOCAINE HCL (PF) 1 % IJ SOLN
INTRAMUSCULAR | Status: DC | PRN
  Administered 2021-08-31: 10 mL

## 2021-08-31 MED ORDER — ONDANSETRON HCL 4 MG/2ML IJ SOLN
INTRAMUSCULAR | Status: DC | PRN
  Administered 2021-08-31: 4 mg via INTRAVENOUS

## 2021-08-31 MED ORDER — LIDOCAINE 2% (20 MG/ML) 5 ML SYRINGE
INTRAMUSCULAR | Status: DC | PRN
  Administered 2021-08-31: 100 mg via INTRAVENOUS

## 2021-08-31 MED ORDER — BUPIVACAINE HCL 0.25 % IJ SOLN
INTRAMUSCULAR | Status: DC | PRN
Start: 2021-08-31 — End: 2021-08-31
  Administered 2021-08-31: 10 mL

## 2021-08-31 MED ORDER — DEXAMETHASONE SODIUM PHOSPHATE 10 MG/ML IJ SOLN
4.0000 mg | INTRAMUSCULAR | Status: AC
  Administered 2021-08-31: 4 mg via INTRAVENOUS

## 2021-08-31 MED ORDER — MIDAZOLAM HCL 2 MG/2ML IJ SOLN
INTRAMUSCULAR | Status: AC
  Filled 2021-08-31: qty 2

## 2021-08-31 MED ORDER — SILVER SULFADIAZINE 1 % EX CREA
TOPICAL_CREAM | CUTANEOUS | Status: DC | PRN
  Administered 2021-08-31: 1 via TOPICAL

## 2021-08-31 MED ORDER — DEXAMETHASONE SODIUM PHOSPHATE 10 MG/ML IJ SOLN
INTRAMUSCULAR | Status: AC
  Filled 2021-08-31: qty 1

## 2021-08-31 MED ORDER — PROPOFOL 10 MG/ML IV BOLUS
INTRAVENOUS | Status: DC | PRN
  Administered 2021-08-31: 200 mg via INTRAVENOUS

## 2021-08-31 SURGICAL SUPPLY — 28 items
BLADE CLIPPER SENSICLIP SURGIC (BLADE) IMPLANT
BLADE SURG 15 STRL LF DISP TIS (BLADE) ×1 IMPLANT
BLADE SURG 15 STRL SS (BLADE) ×2
CATH ROBINSON RED A/P 16FR (CATHETERS) ×2 IMPLANT
GAUZE 4X4 16PLY ~~LOC~~+RFID DBL (SPONGE) ×3 IMPLANT
GLOVE BIO SURGEON STRL SZ 6 (GLOVE) ×4 IMPLANT
GOWN STRL REUS W/TWL LRG LVL3 (GOWN DISPOSABLE) ×2 IMPLANT
KIT TURNOVER CYSTO (KITS) ×2 IMPLANT
NDL HYPO 25X1 1.5 SAFETY (NEEDLE) ×1 IMPLANT
NEEDLE HYPO 25X1 1.5 SAFETY (NEEDLE) ×2 IMPLANT
NS IRRIG 500ML POUR BTL (IV SOLUTION) ×3 IMPLANT
PACK PERINEAL COLD (PAD) ×2 IMPLANT
PACK VAGINAL WOMENS (CUSTOM PROCEDURE TRAY) ×2 IMPLANT
PENCIL BUTTON HOLSTER BLD 10FT (ELECTRODE) ×2 IMPLANT
PUNCH BIOPSY DERMAL 3 (INSTRUMENTS) IMPLANT
PUNCH BIOPSY DERMAL 3MM (INSTRUMENTS)
PUNCH BIOPSY DERMAL 4MM (INSTRUMENTS) IMPLANT
SUT VIC AB 0 SH 27 (SUTURE) ×2 IMPLANT
SUT VIC AB 2-0 CT2 27 (SUTURE) IMPLANT
SUT VIC AB 2-0 SH 27 (SUTURE)
SUT VIC AB 2-0 SH 27XBRD (SUTURE) IMPLANT
SUT VIC AB 3-0 SH 27 (SUTURE) ×4
SUT VIC AB 3-0 SH 27X BRD (SUTURE) ×1 IMPLANT
SUT VIC AB 4-0 PS2 18 (SUTURE) ×2 IMPLANT
SWAB OB GYN 8IN STERILE 2PK (MISCELLANEOUS) IMPLANT
SYR BULB IRRIG 60ML STRL (SYRINGE) ×2 IMPLANT
TOWEL OR 17X26 10 PK STRL BLUE (TOWEL DISPOSABLE) ×2 IMPLANT
WATER STERILE IRR 500ML POUR (IV SOLUTION) IMPLANT

## 2021-08-31 NOTE — Interval H&P Note (Deleted)
History and Physical Interval Note: ? ?08/31/2021 ?7:33 AM ? ?Ellen Ryan  has presented today for surgery, with the diagnosis of VULVAR LESIONS ?HISTORY PAGETS OF VULVA.  The various methods of treatment have been discussed with the patient and family. After consideration of risks, benefits and other options for treatment, the patient has consented to  Procedure(s): ?EXAM UNDER ANESTHESIA (N/A) ?VULVAR BIOPSY (N/A) as a surgical intervention.  The patient's history has been reviewed, patient examined, no change in status, stable for surgery.  I have reviewed the patient's chart and labs.  Questions were answered to the patient's satisfaction.   ? ? ?Lahoma Crocker ? ? ?

## 2021-08-31 NOTE — Op Note (Signed)
? ?  PREOPERATIVE DIAGNOSIS: Vulva lesion, possible recurrent Paget disease ? ?POSTOPERATIVE DIAGNOSIS: Same ? ?OPERATION PERFORMED:  Exam under anesthesia, vulva biopsies. ? ?SPECIMENS: Perineal biopsies. ? ?EBL: 5 ml ? ?ANESTHESIA: MAC, local ? 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 ? ? ?FINDINGS: During the examination under anesthesia, the labia majora are agglutinate.  There is a small posterior opening admits a fingertip.  There are bilateral erythematous patches in a butterfly configuration with discrete borders. No other obvious lesions were seen.  ? ? ? ?PROCEDURE: The patient was prepped and draped.  The skin was infiltrated with a mixture of 1% lidocaine and 0.25% Marcaine.  A scalpel was used to incise the skin on the both sides of the perineum. A 3 mm ellipse was removed bilaterally.  The resulting ellipses were closed with horizontal mattress sutures of 3 - 0 Vicryl.  Adequate hemostasis was noted. Silvadene was applied. The final sponge, needle, and instrument counts were correct at the completion of the procedure. The patient was then taken to the [facility] in stable condition. ?

## 2021-08-31 NOTE — Interval H&P Note (Signed)
History and Physical Interval Note: ? ?08/31/2021 ?7:35 AM ? ?Ellen Ryan  has presented today for surgery, with the diagnosis of VULVAR LESIONS ?HISTORY PAGETS OF VULVA.  The various methods of treatment have been discussed with the patient and family. After consideration of risks, benefits and other options for treatment, the patient has consented to  Procedure(s): ?EXAM UNDER ANESTHESIA (N/A) ?VULVAR BIOPSY (N/A) as a surgical intervention.  The patient's history has been reviewed, patient examined, no change in status, stable for surgery.  I have reviewed the patient's chart and labs.  Questions were answered to the patient's satisfaction.   ? ? ?Lahoma Crocker ? ? ?

## 2021-08-31 NOTE — Brief Op Note (Signed)
08/31/2021 ? ?9:23 AM ? ?PATIENT:  Ellen Ryan  66 y.o. female ? ?PRE-OPERATIVE DIAGNOSIS:  VULVAR LESIONS ?HISTORY PAGETS OF VULVA ? ?POST-OPERATIVE DIAGNOSIS:  VULVAR LESIONS ?HISTORY PAGETS OF VULVA ? ?PROCEDURE:  Procedure(s): ?EXAM UNDER ANESTHESIA (N/A) ?VULVAR BIOPSY (N/A) ? ?SURGEON:  Surgeon(s) and Role: ?   Lahoma Crocker, MD - Primary ? ?  ? ?ANESTHESIA:   local and MAC ? ?EBL:  5 mL  ? ?BLOOD ADMINISTERED:none ? ?DRAINS: none  ? ?LOCAL MEDICATIONS USED:  MARCAINE    and LIDOCAINE  ? ?SPECIMEN:  Source of Specimen:  vulva ? ?DISPOSITION OF SPECIMEN:  PATHOLOGY ? ?COUNTS:  YES ? ? ? ?DICTATION: .Note written in EPIC ? ?PLAN OF CARE: Discharge to home after PACU ? ?PATIENT DISPOSITION:  PACU - hemodynamically stable. ?  ?Delay start of Pharmacological VTE agent (>24hrs) due to surgical blood loss or risk of bleeding: not applicable ? ?

## 2021-08-31 NOTE — Anesthesia Procedure Notes (Signed)
Procedure Name: LMA Insertion ?Date/Time: 08/31/2021 8:43 AM ?Performed by: Mechele Claude, CRNA ?Pre-anesthesia Checklist: Patient identified, Emergency Drugs available, Suction available and Patient being monitored ?Patient Re-evaluated:Patient Re-evaluated prior to induction ?Oxygen Delivery Method: Circle system utilized ?Preoxygenation: Pre-oxygenation with 100% oxygen ?Induction Type: IV induction ?Ventilation: Mask ventilation without difficulty ?LMA: LMA inserted ?LMA Size: 4.0 ?Number of attempts: 1 ?Airway Equipment and Method: Bite block ?Placement Confirmation: positive ETCO2 ?Tube secured with: Tape ?Dental Injury: Teeth and Oropharynx as per pre-operative assessment  ? ? ? ? ?

## 2021-08-31 NOTE — Discharge Instructions (Addendum)
Apply vaseline twice/day x 3 days. ?Post Anesthesia Home Care Instructions ? ?Activity: ?Get plenty of rest for the remainder of the day. A responsible adult should stay with you for 24 hours following the procedure.  ?For the next 24 hours, DO NOT: ?-Drive a car ?-Paediatric nurse ?-Drink alcoholic beverages ?-Take any medication unless instructed by your physician ?-Make any legal decisions or sign important papers. ? ?Meals: ?Start with liquid foods such as gelatin or soup. Progress to regular foods as tolerated. Avoid greasy, spicy, heavy foods. If nausea and/or vomiting occur, drink only clear liquids until the nausea and/or vomiting subsides. Call your physician if vomiting continues. ? ?Special Instructions/Symptoms: ?Your throat may feel dry or sore from the anesthesia or the breathing tube placed in your throat during surgery. If this causes discomfort, gargle with warm salt water. The discomfort should disappear within 24 hours. ? ? ?

## 2021-08-31 NOTE — Transfer of Care (Signed)
Immediate Anesthesia Transfer of Care Note ? ?Patient: Ellen Ryan ? ?Procedure(s) Performed: Procedure(s) (LRB): ?EXAM UNDER ANESTHESIA (N/A) ?VULVAR BIOPSY (N/A) ? ?Patient Location: PACU ? ?Anesthesia Type: General ? ?Level of Consciousness: drowsy ? ?Airway & Oxygen Therapy: Patient Spontanous Breathing and Patient connected to face mask oxygen, Oral airway remaining in place. ? ?Post-op Assessment: Report given to PACU RN and Post -op Vital signs reviewed and stable ? ?Post vital signs: Reviewed and stable ? ?Complications: No apparent anesthesia complications ? ?Last Vitals:  ?Vitals Value Taken Time  ?BP 94/67 08/31/21 0930  ?Temp 36.6 ?C 08/31/21 0930  ?Pulse 74 08/31/21 0934  ?Resp 15 08/31/21 0934  ?SpO2 95 % 08/31/21 0934  ?Vitals shown include unvalidated device data. ? ?Last Pain:  ?Vitals:  ? 08/31/21 0728  ?TempSrc: Oral  ?PainSc: 4   ?   ? ?Patients Stated Pain Goal: 6 (08/31/21 5038) ? ?Complications: No notable events documented. ?

## 2021-08-31 NOTE — Anesthesia Postprocedure Evaluation (Signed)
Anesthesia Post Note ? ?Patient: Ellen Ryan ? ?Procedure(s) Performed: EXAM UNDER ANESTHESIA (Vulva) ?VULVAR BIOPSY (Vulva) ? ?  ? ?Patient location during evaluation: PACU ?Anesthesia Type: General ?Level of consciousness: awake and alert ?Pain management: pain level controlled ?Vital Signs Assessment: post-procedure vital signs reviewed and stable ?Respiratory status: spontaneous breathing, nonlabored ventilation, respiratory function stable and patient connected to nasal cannula oxygen ?Cardiovascular status: blood pressure returned to baseline and stable ?Postop Assessment: no apparent nausea or vomiting ?Anesthetic complications: no ? ? ?No notable events documented. ? ?Last Vitals:  ?Vitals:  ? 08/31/21 1025 08/31/21 1036  ?BP:    ?Pulse: 65 78  ?Resp: 18 (!) 22  ?Temp:  36.6 ?C  ?SpO2: 98% (!) 89%  ?  ?Last Pain:  ?Vitals:  ? 08/31/21 1025  ?TempSrc:   ?PainSc: 0-No pain  ? ? ?  ?  ?  ?  ?  ?  ? ?Effie Berkshire ? ? ? ? ?

## 2021-09-01 ENCOUNTER — Telehealth: Payer: Self-pay | Admitting: *Deleted

## 2021-09-01 ENCOUNTER — Encounter (HOSPITAL_BASED_OUTPATIENT_CLINIC_OR_DEPARTMENT_OTHER): Payer: Self-pay | Admitting: Obstetrics & Gynecology

## 2021-09-01 NOTE — Telephone Encounter (Signed)
Spoke with Ellen Ryan this morning. She states she is eating, drinking and urinating well. She has had a BM yet and is passing gas. She is taking senokot as prescribed and encouraged her to drink plenty of water. She denies fever or chills. Incisions are dry and intact. She rates her pain 4/10. Her pain is controlled with Tylenol and Oxy.   ? ?Instructed to call office with any fever, chills, purulent drainage, uncontrolled pain or any other questions or concerns. Patient verbalizes understanding.  ? ?Pt aware of post op appointments as well as the office number [phone] and after hours number [phone] to call if she has any questions or concerns  ?

## 2021-09-05 LAB — SURGICAL PATHOLOGY

## 2021-09-05 NOTE — Progress Notes (Signed)
Patient here for evaluation with Dr. Lahoma Crocker and for a pre-operative discussion prior to her scheduled surgery on Aug 31, 2021. She is scheduled for examination under anesthesia with vulvar biopsies. The surgery was discussed in detail.  See after visit summary for additional details.  ?  ?Discussed post-op pain management in detail including the aspects of the enhanced recovery pathway.  We discussed the use of tylenol post-op and to monitor for a maximum of 4,000 mg in a 24 hour period. Discussed bowel regimen in detail.   ?  ?Discussed the use of SCDs and measures to take at home to prevent DVT including frequent mobility.  Reportable signs and symptoms of DVT discussed. Post-operative instructions discussed and expectations for after surgery.  ?   ?5 minutes spent with the patient and daughter.  Verbalizing understanding of material discussed. No needs or concerns voiced at the end of the visit.   Advised patient and family to call for any needs.  ? ?This appointment is included in the global surgical bundle as pre-operative teaching and has no charge.     ?

## 2021-09-05 NOTE — Patient Instructions (Signed)
Preparing for your Surgery ?  ?Plan for surgery on Aug 31, 2021 with Dr. Lahoma Crocker at [facility]. You will be scheduled for an examination under anesthesia with vulvar biopsies.  ?  ?Pre-operative Testing ?-You will receive a phone call from presurgical testing at [facility] to discuss surgery instructions and arrange for lab work if needed. ?  ?-Bring your insurance card, copy of an advanced directive if applicable, medication list. ?  ?-You should not be taking blood thinners or aspirin at least ten days prior to surgery unless instructed by your surgeon. ?  ?-Do not take supplements such as fish oil (omega 3), red yeast rice, turmeric before your surgery. You want to avoid medications with aspirin in them including headache powders such as BC or Goody's), Excedrin migraine. ?  ?Day Before Surgery at Home ?-You will be advised you can have clear liquids up until 3 hours before your surgery.   ?  ?Your role in recovery ?Your role is to become active as soon as directed by your doctor, while still giving yourself time to heal.  Rest when you feel tired. You will be asked to do the following in order to speed your recovery: ?  ?- Cough and breathe deeply. This helps to clear and expand your lungs and can prevent pneumonia after surgery.  ?- STAY ACTIVE WHEN YOU GET HOME. Do mild physical activity. Walking or moving your legs help your circulation and body functions return to normal. Do not try to get up or walk alone the first time after surgery.   ?-If you develop swelling on one leg or the other, pain in the back of your leg, redness/warmth in one of your legs, please call the office or go to the Emergency Room to have a doppler to rule out a blood clot. For shortness of breath, chest pain-seek care in the Emergency Room as soon as possible. ?- Actively manage your pain. Managing your pain lets you move in comfort. We will ask you to rate your pain on a scale of zero to 10.  It is your responsibility to tell your doctor or nurse where and how much you hurt so your pain can be treated. ?  ?Special Considerations ?-Your final pathology results from surgery should be available around one week after surgery and the results will be relayed to you when available. ?  ?-FMLA forms can be faxed to [phone] and please allow 5-7 business days for completion. ?  ?Pain Management After Surgery ?-Make sure that you have Tylenol and Ibuprofen IF YOU ARE ABLE TO TAKE THESE MEDICATIONS at home to use on a regular basis after surgery for pain control. We recommend alternating the medications every hour to six hours since they work differently and are processed in the body differently for pain relief. ?  ?-Review the attached handout on narcotic use and their risks and side effects.  ?  ?Bowel Regimen ?-It is important to prevent constipation and drink adequate amounts of liquids.  ?  ?Risks of Surgery ?Risks of surgery are low but include bleeding, infection, damage to surrounding structures, re-operation, blood clots, and very rarely death. ?  ?AFTER SURGERY INSTRUCTIONS ?  ?Return to work:  1-2 weeks if applicable ?  ?Activity: ?1. Be up and out of the bed during the day.  Take a nap if needed.  You may walk up steps but be careful and use the hand rail.  Stair climbing will tire you  more than you think, you may need to stop part way and rest.  ?  ?2. No lifting or straining for 2 weeks over 10 pounds. No pushing, pulling, straining for 2 weeks. ?  ?3. No driving for minimum 24 HOURS after surgery.  Do not drive if you are taking narcotic pain medicine and make sure that your reaction time has returned.  ?  ?4. You can shower as soon as the next day after surgery. Shower daily. No tub baths or submerging your body in water until cleared by your surgeon. If you have the soap that was given to you by pre-surgical testing that was used before surgery, you do not need to use it afterwards because this  can irritate your incisions.  ?  ?5. No sexual activity and nothing in the vagina for 4 weeks. ?  ?6. You may experience vaginal spotting and discharge after surgery.  The spotting is normal but if you experience heavy bleeding, call our office. ?  ?7. Take Tylenol or ibuprofen first for pain and only use narcotic pain medication for severe pain not relieved by the Tylenol or Ibuprofen.  Monitor your Tylenol intake to a max of 4,000 mg in a 24 hour period. You can alternate these medications after surgery. ?  ?Diet: ?1. Low sodium Heart Healthy Diet is recommended but you are cleared to resume your normal (before surgery) diet after your procedure. ?  ?2. It is safe to use a laxative, such as Miralax or Colace, if you have difficulty moving your bowels. You have been prescribed Sennakot at bedtime every evening to keep bowel movements regular and to prevent constipation.   ?  ?Wound Care: ?1. Keep clean and dry.  Shower daily. ?  ?Reasons to call the Doctor: ?Fever - Oral temperature greater than 100.4 degrees Fahrenheit ?Foul-smelling vaginal discharge ?Difficulty urinating ?Nausea and vomiting ?Increased pain at the site of the incision that is unrelieved with pain medicine. ?Difficulty breathing with or without chest pain ?New calf pain especially if only on one side ?Sudden, continuing increased vaginal bleeding with or without clots. ?  ?Contacts: ?For questions or concerns you should contact: ?  ?Dr. Lahoma Crocker at [phone] ?  ?Joylene John, NP at [phone] ?  ?After Hours: call [phone] and have the GYN Oncologist paged/contacted (after 5 pm or on the weekends). ?  ?Messages sent via mychart are for non-urgent matters and are not responded to after hours so for urgent needs, please call the after hours number. ?

## 2021-09-07 ENCOUNTER — Other Ambulatory Visit: Payer: Self-pay | Admitting: Gynecologic Oncology

## 2021-09-07 ENCOUNTER — Telehealth: Payer: Self-pay | Admitting: Gynecologic Oncology

## 2021-09-07 ENCOUNTER — Inpatient Hospital Stay: Payer: 59 | Admitting: Obstetrics & Gynecology

## 2021-09-07 DIAGNOSIS — D071 Carcinoma in situ of vulva: Secondary | ICD-10-CM

## 2021-09-07 DIAGNOSIS — R102 Pelvic and perineal pain: Secondary | ICD-10-CM

## 2021-09-07 MED ORDER — LIDOCAINE 5 % EX OINT: 1.0000 "application " | TOPICAL_OINTMENT | CUTANEOUS | 1 refills | Status: DC | PRN

## 2021-09-07 MED ORDER — OXYCODONE HCL 5 MG PO TABS: 5.0000 mg | ORAL_TABLET | ORAL | 0 refills | Status: DC | PRN

## 2021-09-07 NOTE — Progress Notes (Signed)
See telephone note.

## 2021-09-07 NOTE — Telephone Encounter (Signed)
Called and spoke with the daughter. 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 She states her mother continue to report the feeling of something moving inside her, specifically in the vagina and the perineal region. She is wondering if a scan is necessary to further evaluate and she wants to make sure this is addressed and the patient's concerns are listened to.  ? ?She reports her mother is still having moderate vulvar discomfort and will need a refill on her pain medication. Also discussed the use of topical lidocaine and she would like to have this sent in. Advised she would be contacted with additional recommendations moving forward and to call for any needs. Questions answered. Advised to call for any needs.   ?

## 2021-09-08 ENCOUNTER — Other Ambulatory Visit: Payer: Self-pay | Admitting: Gynecologic Oncology

## 2021-09-08 ENCOUNTER — Telehealth: Payer: Self-pay | Admitting: *Deleted

## 2021-09-08 DIAGNOSIS — M25559 Pain in unspecified hip: Secondary | ICD-10-CM

## 2021-09-08 DIAGNOSIS — R102 Pelvic and perineal pain: Secondary | ICD-10-CM

## 2021-09-08 MED ORDER — LIDOCAINE HCL 2.5 % EX GEL: 1.0000 "application " | CUTANEOUS | 1 refills | Status: DC | PRN

## 2021-09-08 NOTE — Progress Notes (Signed)
See RN note. Plan for [place] per Dr. Merrilee Jansky to further evaluate pain and sensation of something crawling in the vagina and pelvic region.

## 2021-09-08 NOTE — Telephone Encounter (Signed)
Spoke with Loyal Gambler to inform her that we can get her mother in to see Dr.Tucker on Monday 5/22 at 2:15pm. She was agreeable to appointment. Also informed her that we can get a transabdominal U/S for her mother's symptoms. She was agreeable to that and also asked if we can do an MRI. Informed her that I would ask the doctor and let her know. Joylene John, NP notified.

## 2021-09-09 ENCOUNTER — Ambulatory Visit (HOSPITAL_COMMUNITY): Admission: RE | Admit: 2021-09-09 | Payer: 59 | Source: Ambulatory Visit

## 2021-09-09 NOTE — Telephone Encounter (Signed)
[place] scheduled for today at 11:30 am. Called and left a message with appt date/time on both phone number for the chart

## 2021-09-09 NOTE — Telephone Encounter (Signed)
Per voicemail from the patient's daughter scheduled the [place] scan for Monday morning at 8 am, with an 7:45 am arrival. Called and left the patient's daughter a message with the new date/time.

## 2021-09-09 NOTE — Telephone Encounter (Signed)
Patient's daughter called back and confirmed appts for Monday

## 2021-09-09 NOTE — Telephone Encounter (Signed)
Left message for patients daughter Loyal Gambler. Requested return call regarding patients ultrasound appointment. Ultrasound has been scheduled for 11:30 today, as this is very short notice and we understand this appointment time may not work with their schedule. Advised to contact the office to arrange an appointment time that works for them.

## 2021-09-12 ENCOUNTER — Inpatient Hospital Stay: Payer: 59 | Attending: Obstetrics & Gynecology | Admitting: Gynecologic Oncology

## 2021-09-12 ENCOUNTER — Encounter: Payer: Self-pay | Admitting: Gynecologic Oncology

## 2021-09-12 ENCOUNTER — Ambulatory Visit (HOSPITAL_COMMUNITY)
Admission: RE | Admit: 2021-09-12 | Discharge: 2021-09-12 | Disposition: A | Discharge status: Home / Self Care | Payer: 59 | Source: Ambulatory Visit | Attending: Gynecologic Oncology | Admitting: Gynecologic Oncology

## 2021-09-12 ENCOUNTER — Inpatient Hospital Stay (HOSPITAL_BASED_OUTPATIENT_CLINIC_OR_DEPARTMENT_OTHER): Payer: 59 | Admitting: Gynecologic Oncology

## 2021-09-12 VITALS — BP 147/79 | HR 105 | Temp 98.1°F | Resp 16 | Ht <= 58 in | Wt 176.0 lb

## 2021-09-12 DIAGNOSIS — N898 Other specified noninflammatory disorders of vagina: Secondary | ICD-10-CM | POA: Diagnosis not present

## 2021-09-12 DIAGNOSIS — M25559 Pain in unspecified hip: Secondary | ICD-10-CM | POA: Insufficient documentation

## 2021-09-12 DIAGNOSIS — G893 Neoplasm related pain (acute) (chronic): Secondary | ICD-10-CM

## 2021-09-12 DIAGNOSIS — Z9079 Acquired absence of other genital organ(s): Secondary | ICD-10-CM | POA: Diagnosis not present

## 2021-09-12 DIAGNOSIS — R102 Pelvic and perineal pain: Secondary | ICD-10-CM | POA: Diagnosis not present

## 2021-09-12 DIAGNOSIS — D071 Carcinoma in situ of vulva: Secondary | ICD-10-CM | POA: Diagnosis present

## 2021-09-12 DIAGNOSIS — C519 Malignant neoplasm of vulva, unspecified: Secondary | ICD-10-CM | POA: Diagnosis present

## 2021-09-12 DIAGNOSIS — R3915 Urgency of urination: Secondary | ICD-10-CM | POA: Diagnosis not present

## 2021-09-12 MED ORDER — HYDROCODONE-ACETAMINOPHEN 5-325 MG PO TABS: 1.0000 | ORAL_TABLET | Freq: Four times a day (QID) | ORAL | 0 refills | Status: DC | PRN

## 2021-09-12 NOTE — Progress Notes (Signed)
Gynecologic Oncology Return Clinic Visit  09/12/21  Reason for Visit: follow-up in the setting of vulvar dysplasia, history of Paget's  Treatment History: Ellen Ryan is 66 year old female initially seen at the request of Dr Dove for extramammary pagets disease.  The patient is visiting her daughter now and lives in [place]. She reported a 2 year history of vulvar ulceration. She was seen by Dr Dove for this problem in April, 2018 and was taken to the OR for an examination under anesthesia on 09/01/16. Extensive ulceration was present. 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     On 09/21/16, she underwent a complete simple vulvectomy.  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 No invasive carcinoma, negative margins. Post-operative course was uneventful and she was discharged with a foley catheter for one week post-op.    She has urinary urgency not resolved by medications. She feels her urine deviates in stream. Since early April, 2019 she developed irritation of the vulva made better with silvadine cream.   Given that she did not tolerate an office examination she was taken to the operating room on Sep 13, 2017 for examination under anesthesia and vulvar biopsies.  Intraoperative findings were significant for subtle acetowhite changes immediately lateral on the right side of the urethral meatus.  There also acetowhite changes to the midline distal vagina.  Vagina was completely agglutinated.  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  The posterior vaginal introitus biopsy revealed benign normal squamous mucosa.   On 10/19/17 she underwent CO2 laser to the urethral meatus.  In May, 2020 she started experiencing a burning sensation at the skin of her introitus/vulva. She had been prescribed clobetasol but had been using silvadine instead of clobetasol. In 10/2018 was recommended to use nightly clobetasol.  Patient represented for care  in 07/2021 with sensation of "skin crawling". Had self treated for possible pinworms. Due to pain limiting exam, EUA and biopsies performed on 08/31/21. Bilateral biopsies were taken showing VIN3 with associated erosion and inflammation. No Paget's disease identified in these biopsies.   Interval History: Continues to endorse significant vulvar pain.  Also feels that there is something moving around in her vagina, now thinks that there is more than one "crawling" creature.  Continues to feel that her urine stream is deviated and sometimes is unable to urinate adequately.  Goes to the bathroom frequently and this difficulty.  Denies any bleeding.  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    Past Surgical History:  Procedure Laterality Date   CO2 LASER APPLICATION N/A 10/19/2017   Procedure: CO2 LASER APPLICATION OF THE VULVA;  Surgeon: Rossi, Emma, MD;  Location: [facility];  Service: Gynecology;  Laterality: N/A;   EUA/ VULVA BIOPSY  09-01-2016  dr dove  [facility]     RADICAL VULVECTOMY N/A 09/21/2016   Procedure: RADICAL VULVECTOMY COMPLETE;  Surgeon: Rossi, Emma, MD;  Location: [facility];  Service: Gynecology;  Laterality: N/A;   TRANSTHORACIC ECHOCARDIOGRAM  01/16/2017   ef 60-65%,  grade 1 diastolic dysfunction/  very mild AV stenosis with moderately thickened and calcified leaflets (valve area 1.85cm^2)/  moderate LAE   VULVA /PERINEUM BIOPSY N/A  09/13/2017   Procedure: VULVAR BIOPSY;  Surgeon: Rossi, Emma, MD;  Location: [facility];  Service: Gynecology;  Laterality: N/A;   VULVA /PERINEUM BIOPSY N/A 08/31/2021   Procedure: VULVAR BIOPSY;  Surgeon: Jackson-Moore, Lisa, MD;  Location: [facility];  Service: Gynecology;  Laterality: N/A;    Family History  Problem Relation Age of Onset   Stroke Mother    Arthritis Mother    Diabetes Father    Cancer Daughter     Social History   Socioeconomic History   Marital status: Widowed    Spouse name: Not on file   Number of children: Not on file   Years of education: Not on file   Highest education level: Not on file  Occupational History   Not on file  Tobacco Use   Smoking status: Never   Smokeless tobacco: Never  Vaping Use   Vaping Use: Never used  Substance and Sexual Activity   Alcohol use: Not Currently   Drug use: No   Sexual activity: Not on file  Other Topics Concern   Not on file  Social History Narrative   Not on file   Social Determinants of Health   Financial Resource Strain: Not on file  Food Insecurity: Not on file  Transportation Needs: Not on file  Physical Activity: Not on file  Stress: Not on file  Social Connections: Not on file    Current Medications:  Current Outpatient Medications:    HYDROcodone-acetaminophen (NORCO/VICODIN) 5-325 MG tablet, Take 1 tablet by mouth every 6 (six) hours as needed for moderate pain or severe pain. Do not take and drive, do not take with oxycodone, Disp: 15 tablet, Rfl: 0   acetaminophen (TYLENOL) 325 MG tablet, Take 650 mg by mouth every 6 (six) hours as needed., Disp: , Rfl:    amLODipine (NORVASC) 10 MG tablet, Take 1 tablet (10 mg total) by mouth daily., Disp: 30 tablet, Rfl: 6   atorvastatin (LIPITOR) 40 MG tablet, Take 1 tablet (40 mg total) by mouth daily., Disp: 90 tablet, Rfl: 3   blood glucose meter kit and supplies KIT, Dispense based on patient and insurance preference.  Use up to four times daily as directed., Disp: 1 each, Rfl: 0   Blood Glucose Monitoring Suppl (TRUE METRIX METER) w/Device KIT, Use as directed, Disp: 1 kit, Rfl: 0   gabapentin (NEURONTIN) 300 MG capsule, Take 1 capsule (300 mg total) by mouth 3 (three) times daily., Disp: 270 capsule, Rfl: 1   glucose blood (FREESTYLE LITE) test strip, USE ONE STRIP TO TEST FOUR TIMES A DAY, Disp: 100 strip, Rfl: 0   ibuprofen (ADVIL,MOTRIN) 200 MG tablet, Take 200 mg by mouth 2 (two) times daily. Reminded to stop 48 hours prior to surgery, Disp: , Rfl:    Lancets (FREESTYLE) lancets, USE ONE LANCET TO TEST FOUR TIMES A DAY, Disp: 100 each, Rfl: 0   Lidocaine HCl 2.5 % GEL, Apply 1 application. topically as needed. Apply to vulva as needed, Disp: 57 g, Rfl: 1   MELATONIN PO, Take by mouth., Disp: , Rfl:    metFORMIN (GLUCOPHAGE)   500 MG tablet, Take 1 tablet (500 mg total) by mouth 2 (two) times daily with a meal., Disp: 60 tablet, Rfl: 6   Multiple Vitamin (MULTIVITAMIN WITH MINERALS) TABS tablet, Take 1 tablet by mouth daily. Centrum, Disp: , Rfl:    Omega-3 Fatty Acids (FISH OIL) 1000 MG CAPS, Take by mouth., Disp: , Rfl:    polyethylene glycol (MIRALAX / GLYCOLAX) packet, Take 17 g by mouth daily as needed. As needed, Disp: , Rfl:    senna (SENOKOT) 8.6 MG TABS tablet, Take 1 tablet (8.6 mg total) by mouth at bedtime., Disp: 30 each, Rfl: 0  Review of Systems: Denies appetite changes, fevers, chills, fatigue, unexplained weight changes. Denies hearing loss, neck lumps or masses, mouth sores, ringing in ears or voice changes. Denies cough or wheezing.  Denies shortness of breath. Denies chest pain or palpitations. Denies leg swelling. Denies abdominal distention, pain, blood in stools, constipation, diarrhea, nausea, vomiting, or early satiety. Denies joint pain, back pain or muscle pain/cramps. Denies itching, rash, or wounds. Denies dizziness, headaches, numbness or seizures. Denies swollen lymph nodes  or glands, denies easy bruising or bleeding. Denies anxiety, depression, confusion, or decreased concentration.  Physical Exam: BP (!) 147/79 (BP Location: Left Arm, Patient Position: Sitting)   Pulse (!) 105   Temp 98.1 F (36.7 C) (Oral)   Resp 16   Ht 4' 10" (1.473 m)   Wt 176 lb (79.8 kg)   SpO2 96%   BMI 36.78 kg/m  General: Alert, oriented, no acute distress. HEENT: Normocephalic, atraumatic, sclera anicteric. Chest: Clear to auscultation bilaterally.  No wheezes or rhonchi. Cardiovascular: Regular rate and rhythm, no murmurs. Extremities: Grossly normal range of motion.  Warm, well perfused.  No edema bilaterally. GU: Deferred.  Laboratory & Radiologic Studies: Pelvic ultrasound on 5/22: IMPRESSION: 1. Patient's known fibroids are difficult to accurately delineate on this transabdominal exam, coarse calcifications corresponding to calcified fibroids on prior imaging, approximately 2.8 cm greatest dimension. 2. No endometrial thickening. 3. Neither ovary is visualized. No evidence of adnexal mass. No pelvic free fluid.  CT Pelvis 06/03/21: Uterine fibroids. Otherwise negative CT pelvis.   Assessment & Plan: Ellen Ryan is a 66 y.o. woman with history of Paget's disease of the vulva as well as high-grade dysplasia, requiring prior excisional procedures as well as laser ablation now with significant vulvar symptoms again and biopsies revealing VIN 3.  Reviewed biopsy results with the patient and her daughter.  Given difficulty with her toleration of exam in clinic, I have recommended repeat exam under anesthesia with treatment of this high-grade dysplasia.  I think given her anatomy, excision is not feasible and I would recommend that we proceed with laser ablation as well as some additional biopsies of the area to rule out underlying Paget's or invasive cancer.  We discussed her symptoms of something crawling within her vagina.  We discussed her normal CT scan from  February of this year as well as her ultrasound from earlier this morning.  Her vagina is quite agglutinated, but I think will allow passage of 1 digit or a hysteroscope.  We discussed washing out the vaginal passage and performing vaginoscopy.  It is difficult to tell from media picture of recent procedure whether her urethra is completely covered by her agglutinated upper vulva.   We also discussed the role of topical treatment for high-grade dysplasia.  Given her anatomy as well as other complaints, I think that surgical treatment at this time is recommended.  Topical treatment   may be an option moving forward if she has recurrence of dysplasia.  36 minutes of total time was spent for this patient encounter, including preparation, face-to-face counseling with the patient and coordination of care, and documentation of the encounter.  Eilee Schader, MD  Division of Gynecologic Oncology  Department of Obstetrics and Gynecology  [facility]   

## 2021-09-12 NOTE — Patient Instructions (Addendum)
Preparing for your Surgery   Plan for surgery on Sep 21, 2021 with Dr. Jeral Pinch at [facility]. You will be scheduled for vaginoscopy, vulvar biopsies, vulvar laser.    Pre-operative Testing -You will receive a phone call from presurgical testing at [facility] to discuss surgery instructions and arrange for lab work if needed.   -Bring your insurance card, copy of an advanced directive if applicable, medication list.   -You should not be taking blood thinners or aspirin at least ten days prior to surgery unless instructed by your surgeon.   -Do not take supplements such as fish oil (omega 3), red yeast rice, turmeric before your surgery. You want to avoid medications with aspirin in them including headache powders such as BC or Goody's), Excedrin migraine.   Day Before Surgery at [place] will be advised you can have clear liquids up until 3 hours before your surgery.     Your role in recovery Your role is to become active as soon as directed by your doctor, while still giving yourself time to heal.  Rest when you feel tired. You will be asked to do the following in order to speed your recovery:   - Cough and breathe deeply. This helps to clear and expand your lungs and can prevent pneumonia after surgery.  - [place]. Do mild physical activity. Walking or moving your legs help your circulation and body functions return to normal. Do not try to get up or walk alone the first time after surgery.   -If you develop swelling on one leg or the other, pain in the back of your leg, redness/warmth in one of your legs, please call the office or go to the Emergency Room to have a doppler to rule out a blood clot. For shortness of breath, chest pain-seek care in the Emergency Room as soon as possible. - Actively manage your pain. Managing your pain lets you move in comfort. We will ask you to rate your pain on a scale of zero to 10. It is your  responsibility to tell your doctor or nurse where and how much you hurt so your pain can be treated.   Special Considerations -Your final pathology results from surgery should be available around one week after surgery and the results will be relayed to you when available.   -FMLA forms can be faxed to [phone] and please allow 5-7 business days for completion.   Pain Management After Surgery -Make sure that you have Tylenol and Ibuprofen IF YOU ARE ABLE TO TAKE THESE MEDICATIONS at home to use on a regular basis after surgery for pain control. We recommend alternating the medications every hour to six hours since they work differently and are processed in the body differently for pain relief.   -Review the attached handout on narcotic use and their risks and side effects.    Bowel Regimen -It is important to prevent constipation and drink adequate amounts of liquids.    Risks of Surgery Risks of surgery are low but include bleeding, infection, damage to surrounding structures, re-operation, blood clots, and very rarely death.   AFTER SURGERY INSTRUCTIONS   Return to work:  1-2 weeks if applicable  We recommend purchasing several bags of frozen green peas and dividing them into ziploc bags. You will want to keep these in the freezer and have them ready to use as ice packs to the vulvar incision. Once the ice pack  is no longer cold, you can get another from the freezer. The frozen peas mold to your body better than a regular ice pack.    Activity: 1. Be up and out of the bed during the day.  Take a nap if needed.  You may walk up steps but be careful and use the hand rail.  Stair climbing will tire you more than you think, you may need to stop part way and rest.    2. No lifting or straining for 2 weeks over 10 pounds. No pushing, pulling, straining for 2 weeks.   3. No driving for minimum 24 HOURS after surgery.  Do not drive if you are taking narcotic pain medicine and make sure  that your reaction time has returned.    4. You can shower as soon as the next day after surgery. Shower daily. No tub baths or submerging your body in water until cleared by your surgeon. If you have the soap that was given to you by pre-surgical testing that was used before surgery, you do not need to use it afterwards because this can irritate your incisions.    5. No sexual activity and nothing in the vagina for 4 weeks.   6. You may experience vaginal spotting and discharge after surgery.  The spotting is normal but if you experience heavy bleeding, call our office.   7. Take Tylenol or ibuprofen first for pain and only use narcotic pain medication for severe pain not relieved by the Tylenol or Ibuprofen.  Monitor your Tylenol intake to a max of 4,000 mg in a 24 hour period. You can alternate these medications after surgery.   Diet: 1. Low sodium Heart Healthy Diet is recommended but you are cleared to resume your normal (before surgery) diet after your procedure.   2. It is safe to use a laxative, such as Miralax or Colace, if you have difficulty moving your bowels. You have been prescribed Sennakot at bedtime every evening to keep bowel movements regular and to prevent constipation.     Wound Care: 1. Keep clean and dry.  Shower daily.   Reasons to call the Doctor: Fever - Oral temperature greater than 100.4 degrees Fahrenheit Foul-smelling vaginal discharge Difficulty urinating Nausea and vomiting Increased pain at the site of the incision that is unrelieved with pain medicine. Difficulty breathing with or without chest pain New calf pain especially if only on one side Sudden, continuing increased vaginal bleeding with or without clots.   Contacts: For questions or concerns you should contact:   Dr. Jeral Pinch at [phone]   Joylene John, NP at [phone]   After Hours: call [phone] and have the GYN Oncologist paged/contacted (after 5 pm or on the  weekends).   Messages sent via mychart are for non-urgent matters and are not responded to after hours so for urgent needs, please call the after hours number.

## 2021-09-12 NOTE — H&P (View-Only) (Signed)
Gynecologic Oncology Return Clinic Visit  09/12/21  Reason for Visit: follow-up in the setting of vulvar dysplasia, history of Paget's  Treatment History: Ellen Ryan is 66 year old female initially seen at the request of [name] for extramammary pagets disease.  The patient is visiting her daughter now and lives in [place]. She reported a 2 year history of vulvar ulceration. She was seen by [name] for this problem in April, 2018 and was taken to the OR for an examination under anesthesia on 09/01/16. Extensive ulceration was present. 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     On 09/21/16, she underwent a complete simple vulvectomy.  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 No invasive carcinoma, negative margins. Post-operative course was uneventful and she was discharged with a foley catheter for one week post-op.    She has urinary urgency not resolved by medications. She feels her urine deviates in stream. Since early April, 2019 she developed irritation of the vulva made better with silvadine cream.   Given that she did not tolerate an office examination she was taken to the operating room on Sep 13, 2017 for examination under anesthesia and vulvar biopsies.  Intraoperative findings were significant for subtle acetowhite changes immediately lateral on the right side of the urethral meatus.  There also acetowhite changes to the midline distal vagina.  Vagina was completely agglutinated.  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  The posterior vaginal introitus biopsy revealed benign normal squamous mucosa.   On 10/19/17 she underwent CO2 laser to the urethral meatus.  In May, 2020 she started experiencing a burning sensation at the skin of her introitus/vulva. She had been prescribed clobetasol but had been using silvadine instead of clobetasol. In 10/2018 was recommended to use nightly clobetasol.  Patient represented for care  in 07/2021 with sensation of "skin crawling". Had self treated for possible pinworms. Due to pain limiting exam, EUA and biopsies performed on 08/31/21. Bilateral biopsies were taken showing VIN3 with associated erosion and inflammation. No Paget's disease identified in these biopsies.   Interval History: Continues to endorse significant vulvar pain.  Also feels that there is something moving around in her vagina, now thinks that there is more than one "crawling" creature.  Continues to feel that her urine stream is deviated and sometimes is unable to urinate adequately.  Goes to the bathroom frequently and this difficulty.  Denies any bleeding.  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    Past Surgical History:  Procedure Laterality Date   CO2 LASER APPLICATION N/A 9/67/8938   Procedure: CO2 LASER APPLICATION OF THE VULVA;  Surgeon: Everitt Amber, MD;  Location: [facility];  Service: Gynecology;  Laterality: N/A;   EUA/ VULVA BIOPSY  09-01-2016  [name]  [facility]  RADICAL VULVECTOMY N/A 09/21/2016   Procedure: RADICAL VULVECTOMY COMPLETE;  Surgeon: Rossi, Emma, MD;  Location: [facility];  Service: Gynecology;  Laterality: N/A;   TRANSTHORACIC ECHOCARDIOGRAM  01/16/2017   ef 60-65%,  grade 1 diastolic dysfunction/  very mild AV stenosis with moderately thickened and calcified leaflets (valve area 1.85cm^2)/  moderate LAE   VULVA /PERINEUM BIOPSY N/A  09/13/2017   Procedure: VULVAR BIOPSY;  Surgeon: Rossi, Emma, MD;  Location: [facility];  Service: Gynecology;  Laterality: N/A;   VULVA /PERINEUM BIOPSY N/A 08/31/2021   Procedure: VULVAR BIOPSY;  Surgeon: Jackson-Moore, Lisa, MD;  Location: [facility];  Service: Gynecology;  Laterality: N/A;    Family History  Problem Relation Age of Onset   Stroke Mother    Arthritis Mother    Diabetes Father    Cancer Daughter     Social History   Socioeconomic History   Marital status: Widowed    Spouse name: Not on file   Number of children: Not on file   Years of education: Not on file   Highest education level: Not on file  Occupational History   Not on file  Tobacco Use   Smoking status: Never   Smokeless tobacco: Never  Vaping Use   Vaping Use: Never used  Substance and Sexual Activity   Alcohol use: Not Currently   Drug use: No   Sexual activity: Not on file  Other Topics Concern   Not on file  Social History Narrative   Not on file   Social Determinants of Health   Financial Resource Strain: Not on file  Food Insecurity: Not on file  Transportation Needs: Not on file  Physical Activity: Not on file  Stress: Not on file  Social Connections: Not on file    Current Medications:  Current Outpatient Medications:    HYDROcodone-acetaminophen (NORCO/VICODIN) 5-325 MG tablet, Take 1 tablet by mouth every 6 (six) hours as needed for moderate pain or severe pain. Do not take and drive, do not take with oxycodone, Disp: 15 tablet, Rfl: 0   acetaminophen (TYLENOL) 325 MG tablet, Take 650 mg by mouth every 6 (six) hours as needed., Disp: , Rfl:    amLODipine (NORVASC) 10 MG tablet, Take 1 tablet (10 mg total) by mouth daily., Disp: 30 tablet, Rfl: 6   atorvastatin (LIPITOR) 40 MG tablet, Take 1 tablet (40 mg total) by mouth daily., Disp: 90 tablet, Rfl: 3   blood glucose meter kit and supplies KIT, Dispense based on patient and insurance preference.  Use up to four times daily as directed., Disp: 1 each, Rfl: 0   Blood Glucose Monitoring Suppl (TRUE METRIX METER) w/Device KIT, Use as directed, Disp: 1 kit, Rfl: 0   gabapentin (NEURONTIN) 300 MG capsule, Take 1 capsule (300 mg total) by mouth 3 (three) times daily., Disp: 270 capsule, Rfl: 1   glucose blood (FREESTYLE LITE) test strip, USE ONE STRIP TO TEST FOUR TIMES A DAY, Disp: 100 strip, Rfl: 0   ibuprofen (ADVIL,MOTRIN) 200 MG tablet, Take 200 mg by mouth 2 (two) times daily. Reminded to stop 48 hours prior to surgery, Disp: , Rfl:    Lancets (FREESTYLE) lancets, USE ONE LANCET TO TEST FOUR TIMES A DAY, Disp: 100 each, Rfl: 0   Lidocaine HCl 2.5 % GEL, Apply 1 application. topically as needed. Apply to vulva as needed, Disp: 57 g, Rfl: 1   MELATONIN PO, Take by mouth., Disp: , Rfl:    metFORMIN (GLUCOPHAGE)   500 MG tablet, Take 1 tablet (500 mg total) by mouth 2 (two) times daily with a meal., Disp: 60 tablet, Rfl: 6   Multiple Vitamin (MULTIVITAMIN WITH MINERALS) TABS tablet, Take 1 tablet by mouth daily. Centrum, Disp: , Rfl:    Omega-3 Fatty Acids (FISH OIL) 1000 MG CAPS, Take by mouth., Disp: , Rfl:    polyethylene glycol (MIRALAX / GLYCOLAX) packet, Take 17 g by mouth daily as needed. As needed, Disp: , Rfl:    senna (SENOKOT) 8.6 MG TABS tablet, Take 1 tablet (8.6 mg total) by mouth at bedtime., Disp: 30 each, Rfl: 0  Review of Systems: Denies appetite changes, fevers, chills, fatigue, unexplained weight changes. Denies hearing loss, neck lumps or masses, mouth sores, ringing in ears or voice changes. Denies cough or wheezing.  Denies shortness of breath. Denies chest pain or palpitations. Denies leg swelling. Denies abdominal distention, pain, blood in stools, constipation, diarrhea, nausea, vomiting, or early satiety. Denies joint pain, back pain or muscle pain/cramps. Denies itching, rash, or wounds. Denies dizziness, headaches, numbness or seizures. Denies swollen lymph nodes  or glands, denies easy bruising or bleeding. Denies anxiety, depression, confusion, or decreased concentration.  Physical Exam: BP (!) 147/79 (BP Location: Left Arm, Patient Position: Sitting)   Pulse (!) 105   Temp 98.1 F (36.7 C) (Oral)   Resp 16   Ht 4' 10" (1.473 m)   Wt 176 lb (79.8 kg)   SpO2 96%   BMI 36.78 kg/m  General: Alert, oriented, no acute distress. HEENT: Normocephalic, atraumatic, sclera anicteric. Chest: Clear to auscultation bilaterally.  No wheezes or rhonchi. Cardiovascular: Regular rate and rhythm, no murmurs. Extremities: Grossly normal range of motion.  Warm, well perfused.  No edema bilaterally. GU: Deferred.  Laboratory & Radiologic Studies: Pelvic ultrasound on 5/22: IMPRESSION: 1. Patient's known fibroids are difficult to accurately delineate on this transabdominal exam, coarse calcifications corresponding to calcified fibroids on prior imaging, approximately 2.8 cm greatest dimension. 2. No endometrial thickening. 3. Neither ovary is visualized. No evidence of adnexal mass. No pelvic free fluid.  CT Pelvis 06/03/21: Uterine fibroids. Otherwise negative CT pelvis.   Assessment & Plan: Ellen Ryan is a 66 y.o. woman with history of Paget's disease of the vulva as well as high-grade dysplasia, requiring prior excisional procedures as well as laser ablation now with significant vulvar symptoms again and biopsies revealing VIN 3.  Reviewed biopsy results with the patient and her daughter.  Given difficulty with her toleration of exam in clinic, I have recommended repeat exam under anesthesia with treatment of this high-grade dysplasia.  I think given her anatomy, excision is not feasible and I would recommend that we proceed with laser ablation as well as some additional biopsies of the area to rule out underlying Paget's or invasive cancer.  We discussed her symptoms of something crawling within her vagina.  We discussed her normal CT scan from  February of this year as well as her ultrasound from earlier this morning.  Her vagina is quite agglutinated, but I think will allow passage of 1 digit or a hysteroscope.  We discussed washing out the vaginal passage and performing vaginoscopy.  It is difficult to tell from media picture of recent procedure whether her urethra is completely covered by her agglutinated upper vulva.   We also discussed the role of topical treatment for high-grade dysplasia.  Given her anatomy as well as other complaints, I think that surgical treatment at this time is recommended.  Topical treatment   may be an option moving forward if she has recurrence of dysplasia.  36 minutes of total time was spent for this patient encounter, including preparation, face-to-face counseling with the patient and coordination of care, and documentation of the encounter.  Jeral Pinch, MD  Division of Gynecologic Oncology  Department of Obstetrics and Gynecology  [facility]

## 2021-09-14 ENCOUNTER — Ambulatory Visit: Payer: 59 | Admitting: Obstetrics & Gynecology

## 2021-09-14 DIAGNOSIS — C519 Malignant neoplasm of vulva, unspecified: Secondary | ICD-10-CM

## 2021-09-16 NOTE — Progress Notes (Signed)
Patient here with her daughter for follow up with Dr. Jeral Pinch and for a pre-operative discussion prior to her scheduled surgery on Sep 21, 2021. She is scheduled for vaginoscopy, vulvar biopsies, vulvar laser. The surgery was discussed in detail.  See after visit summary for additional details.    Discussed post-op pain management in detail including the aspects of the enhanced recovery pathway.  Advised her that a new prescription would be sent in for hydrocodone/APAP.  We discussed the use of tylenol post-op and to monitor for a maximum of 4,000 mg in a 24 hour period.    Post-operative instructions discussed and expectations for after surgery.     5 minutes spent with the patient.  Verbalizing understanding of material discussed. No needs or concerns voiced at the end of the visit.   Advised patient and family to call for any needs.   This appointment is included in the global surgical bundle as pre-operative teaching and has no charge.

## 2021-09-16 NOTE — Patient Instructions (Signed)
Preparing for your Surgery   Plan for surgery on Sep 21, 2021 with Dr. Jeral Pinch at [facility]. You will be scheduled for vaginoscopy, vulvar biopsies, vulvar laser.    Pre-operative Testing -You will receive a phone call from presurgical testing at [facility] to discuss surgery instructions and arrange for lab work if needed.   -Bring your insurance card, copy of an advanced directive if applicable, medication list.   -You should not be taking blood thinners or aspirin at least ten days prior to surgery unless instructed by your surgeon.   -Do not take supplements such as fish oil (omega 3), red yeast rice, turmeric before your surgery. You want to avoid medications with aspirin in them including headache powders such as BC or Goody's), Excedrin migraine.   Day Before Surgery at [facility] will be advised you can have clear liquids up until 3 hours before your surgery.     Your role in recovery Your role is to become active as soon as directed by your doctor, while still giving yourself time to heal.  Rest when you feel tired. You will be asked to do the following in order to speed your recovery:   - Cough and breathe deeply. This helps to clear and expand your lungs and can prevent pneumonia after surgery.  - [place]. Do mild physical activity. Walking or moving your legs help your circulation and body functions return to normal. Do not try to get up or walk alone the first time after surgery.   -If you develop swelling on one leg or the other, pain in the back of your leg, redness/warmth in one of your legs, please call the office or go to the Emergency Room to have a doppler to rule out a blood clot. For shortness of breath, chest pain-seek care in the Emergency Room as soon as possible. - Actively manage your pain. Managing your pain lets you move in comfort. We will ask you to rate your pain on a scale of zero to 10. It is your  responsibility to tell your doctor or nurse where and how much you hurt so your pain can be treated.   Special Considerations -Your final pathology results from surgery should be available around one week after surgery and the results will be relayed to you when available.   -FMLA forms can be faxed to [phone] and please allow 5-7 business days for completion.   Pain Management After Surgery -Make sure that you have Tylenol and Ibuprofen IF YOU ARE ABLE TO TAKE THESE MEDICATIONS at home to use on a regular basis after surgery for pain control. We recommend alternating the medications every hour to six hours since they work differently and are processed in the body differently for pain relief.   -Review the attached handout on narcotic use and their risks and side effects.    Bowel Regimen -It is important to prevent constipation and drink adequate amounts of liquids.    Risks of Surgery Risks of surgery are low but include bleeding, infection, damage to surrounding structures, re-operation, blood clots, and very rarely death.   AFTER SURGERY INSTRUCTIONS   Return to work:  1-2 weeks if applicable   We recommend purchasing several bags of frozen green peas and dividing them into ziploc bags. You will want to keep these in the freezer and have them ready to use as ice packs to the vulvar incision. Once the ice  pack is no longer cold, you can get another from the freezer. The frozen peas mold to your body better than a regular ice pack.    Activity: 1. Be up and out of the bed during the day.  Take a nap if needed.  You may walk up steps but be careful and use the hand rail.  Stair climbing will tire you more than you think, you may need to stop part way and rest.    2. No lifting or straining for 2 weeks over 10 pounds. No pushing, pulling, straining for 2 weeks.   3. No driving for minimum 24 HOURS after surgery.  Do not drive if you are taking narcotic pain medicine and make sure  that your reaction time has returned.    4. You can shower as soon as the next day after surgery. Shower daily. No tub baths or submerging your body in water until cleared by your surgeon. If you have the soap that was given to you by pre-surgical testing that was used before surgery, you do not need to use it afterwards because this can irritate your incisions.    5. No sexual activity and nothing in the vagina for 4 weeks.   6. You may experience vaginal spotting and discharge after surgery.  The spotting is normal but if you experience heavy bleeding, call our office.   7. Take Tylenol or ibuprofen first for pain and only use narcotic pain medication for severe pain not relieved by the Tylenol or Ibuprofen.  Monitor your Tylenol intake to a max of 4,000 mg in a 24 hour period. You can alternate these medications after surgery.   Diet: 1. Low sodium Heart Healthy Diet is recommended but you are cleared to resume your normal (before surgery) diet after your procedure.   2. It is safe to use a laxative, such as Miralax or Colace, if you have difficulty moving your bowels. You have been prescribed Sennakot at bedtime every evening to keep bowel movements regular and to prevent constipation.     Wound Care: 1. Keep clean and dry.  Shower daily.   Reasons to call the Doctor: Fever - Oral temperature greater than 100.4 degrees Fahrenheit Foul-smelling vaginal discharge Difficulty urinating Nausea and vomiting Increased pain at the site of the incision that is unrelieved with pain medicine. Difficulty breathing with or without chest pain New calf pain especially if only on one side Sudden, continuing increased vaginal bleeding with or without clots.   Contacts: For questions or concerns you should contact:   Dr. Jeral Pinch at [phone]   Joylene John, NP at [phone]   After Hours: call [phone] and have the GYN Oncologist paged/contacted (after 5 pm or on the  weekends).   Messages sent via mychart are for non-urgent matters and are not responded to after hours so for urgent needs, please call the after hours number.

## 2021-09-20 ENCOUNTER — Encounter (HOSPITAL_BASED_OUTPATIENT_CLINIC_OR_DEPARTMENT_OTHER): Payer: Self-pay | Admitting: Gynecologic Oncology

## 2021-09-20 ENCOUNTER — Telehealth: Payer: Self-pay

## 2021-09-20 ENCOUNTER — Other Ambulatory Visit: Payer: Self-pay

## 2021-09-20 NOTE — Progress Notes (Signed)
Spoke w/ via phone for pre-op interview--- pt's daughter, Loyal Gambler Lab needs dos----   [employer] results------ current ekg in epic/ chart COVID test -----patient states asymptomatic no test needed Arrive at ------- 1200 on 09-21-2021 NPO after MN NO Solid Food.  Clear liquids from MN until--- Med rec completed Medications to take morning of surgery ----- gabapentin Diabetic medication ----- do no take metformin morning of surgery Patient instructed no nail polish to be worn day of surgery Patient instructed to bring photo id and insurance card day of surgery Patient aware to have Driver (ride ) / caregiver for 24 hours after surgery ---daughter(stated she will be dropping off and picking up) Patient Special Instructions ----- n/a  Pre-Op special Istructions -----  pt's primary language is [place] ([place]), daughter stated that pt does understanding English pretty well but asked questions pt could not understand.  Will need to use Ipad video for interpreting because there are no in person interpreter for this language , [place], per [place] interpreting.  Patient verbalized understanding of instructions that were given at this phone interview. Patient denies shortness of breath, chest pain, fever, cough at this phone interview.

## 2021-09-20 NOTE — Telephone Encounter (Signed)
Attempted to reach patient to check in with her pre-operatively. Unable to reach patient. Left message requesting return call.  

## 2021-09-20 NOTE — Telephone Encounter (Signed)
Telephone call to check on pre-operative status.  Spoke with patients daughter Loyal Gambler, patient is compliant with pre-operative instructions.  Reinforced nothing to eat after midnight. Clear liquids until 11am. Patient to arrive at 11:45am.  No questions or concerns voiced.  Instructed to call for any needs.

## 2021-09-21 ENCOUNTER — Other Ambulatory Visit: Payer: Self-pay

## 2021-09-21 ENCOUNTER — Ambulatory Visit (HOSPITAL_BASED_OUTPATIENT_CLINIC_OR_DEPARTMENT_OTHER)
Admission: RE | Admit: 2021-09-21 | Discharge: 2021-09-21 | Disposition: A | Discharge status: Home / Self Care | Payer: 59 | Attending: Gynecologic Oncology | Admitting: Gynecologic Oncology

## 2021-09-21 ENCOUNTER — Encounter (HOSPITAL_BASED_OUTPATIENT_CLINIC_OR_DEPARTMENT_OTHER): Payer: Self-pay | Admitting: Gynecologic Oncology

## 2021-09-21 ENCOUNTER — Ambulatory Visit (HOSPITAL_BASED_OUTPATIENT_CLINIC_OR_DEPARTMENT_OTHER): Payer: 59 | Admitting: Anesthesiology

## 2021-09-21 ENCOUNTER — Encounter (HOSPITAL_BASED_OUTPATIENT_CLINIC_OR_DEPARTMENT_OTHER)
Admission: RE | Disposition: A | Discharge status: Home / Self Care | Payer: Self-pay | Source: Home / Self Care | Attending: Gynecologic Oncology

## 2021-09-21 DIAGNOSIS — D071 Carcinoma in situ of vulva: Secondary | ICD-10-CM | POA: Insufficient documentation

## 2021-09-21 DIAGNOSIS — C519 Malignant neoplasm of vulva, unspecified: Secondary | ICD-10-CM

## 2021-09-21 DIAGNOSIS — N903 Dysplasia of vulva, unspecified: Secondary | ICD-10-CM | POA: Diagnosis present

## 2021-09-21 DIAGNOSIS — Z01818 Encounter for other preprocedural examination: Secondary | ICD-10-CM

## 2021-09-21 HISTORY — PX: CO2 LASER APPLICATION: SHX5778

## 2021-09-21 LAB — POCT I-STAT, CHEM 8
BUN: 8 mg/dL (ref 8–23)
Calcium, Ion: 1.27 mmol/L (ref 1.15–1.40)
Chloride: 99 mmol/L (ref 98–111)
Creatinine, Ser: 0.5 mg/dL (ref 0.44–1.00)
Glucose, Bld: 115 mg/dL — ABNORMAL HIGH (ref 70–99)
HCT: 40 % (ref 36.0–46.0)
Hemoglobin: 13.6 g/dL (ref 12.0–15.0)
Potassium: 3.8 mmol/L (ref 3.5–5.1)
Sodium: 140 mmol/L (ref 135–145)
TCO2: 28 mmol/L (ref 22–32)

## 2021-09-21 LAB — GLUCOSE, CAPILLARY: Glucose-Capillary: 99 mg/dL (ref 70–99)

## 2021-09-21 SURGERY — CO2 LASER APPLICATION
Anesthesia: General | Site: Vulva

## 2021-09-21 MED ORDER — CEPHALEXIN 500 MG PO CAPS: 500.0000 mg | ORAL_CAPSULE | Freq: Four times a day (QID) | ORAL | 0 refills | Status: AC

## 2021-09-21 MED ORDER — 0.9 % SODIUM CHLORIDE (POUR BTL) OPTIME
TOPICAL | Status: DC | PRN
  Administered 2021-09-21: 1000 mL

## 2021-09-21 MED ORDER — ACETAMINOPHEN 500 MG PO TABS
500.0000 mg | ORAL_TABLET | Freq: Once | ORAL | Status: AC
  Administered 2021-09-21: 500 mg via ORAL

## 2021-09-21 MED ORDER — MIDAZOLAM HCL 5 MG/5ML IJ SOLN
INTRAMUSCULAR | Status: DC | PRN
  Administered 2021-09-21: 1 mg via INTRAVENOUS

## 2021-09-21 MED ORDER — LIDOCAINE HCL (PF) 2 % IJ SOLN
INTRAMUSCULAR | Status: AC
  Filled 2021-09-21: qty 5

## 2021-09-21 MED ORDER — LACTATED RINGERS IV SOLN: INTRAVENOUS | Status: DC

## 2021-09-21 MED ORDER — FENTANYL CITRATE (PF) 100 MCG/2ML IJ SOLN
INTRAMUSCULAR | Status: AC
  Filled 2021-09-21: qty 2

## 2021-09-21 MED ORDER — LIDOCAINE HCL 1 % IJ SOLN
INTRAMUSCULAR | Status: DC | PRN
  Administered 2021-09-21: 10 mL

## 2021-09-21 MED ORDER — DEXAMETHASONE SODIUM PHOSPHATE 10 MG/ML IJ SOLN
INTRAMUSCULAR | Status: DC | PRN
  Administered 2021-09-21: 5 mg via INTRAVENOUS

## 2021-09-21 MED ORDER — MIDAZOLAM HCL 2 MG/2ML IJ SOLN
INTRAMUSCULAR | Status: AC
  Filled 2021-09-21: qty 2

## 2021-09-21 MED ORDER — PROPOFOL 10 MG/ML IV BOLUS
INTRAVENOUS | Status: DC | PRN
  Administered 2021-09-21: 50 mg via INTRAVENOUS
  Administered 2021-09-21: 150 mg via INTRAVENOUS

## 2021-09-21 MED ORDER — LIDOCAINE 2% (20 MG/ML) 5 ML SYRINGE
INTRAMUSCULAR | Status: DC | PRN
  Administered 2021-09-21: 60 mg via INTRAVENOUS

## 2021-09-21 MED ORDER — ACETAMINOPHEN 500 MG PO TABS: 1000.0000 mg | ORAL_TABLET | ORAL | Status: DC

## 2021-09-21 MED ORDER — ACETAMINOPHEN 500 MG PO TABS
ORAL_TABLET | ORAL | Status: AC
  Filled 2021-09-21: qty 2

## 2021-09-21 MED ORDER — SILVER SULFADIAZINE 1 % EX CREA: TOPICAL_CREAM | Freq: Two times a day (BID) | CUTANEOUS | Status: DC

## 2021-09-21 MED ORDER — FENTANYL CITRATE (PF) 100 MCG/2ML IJ SOLN
25.0000 ug | INTRAMUSCULAR | Status: DC | PRN
  Administered 2021-09-21 (×2): 50 ug via INTRAVENOUS

## 2021-09-21 MED ORDER — FENTANYL CITRATE (PF) 100 MCG/2ML IJ SOLN
INTRAMUSCULAR | Status: DC | PRN
  Administered 2021-09-21 (×2): 50 ug via INTRAVENOUS
  Administered 2021-09-21: 25 ug via INTRAVENOUS

## 2021-09-21 MED ORDER — EPHEDRINE SULFATE-NACL 50-0.9 MG/10ML-% IV SOSY
PREFILLED_SYRINGE | INTRAVENOUS | Status: DC | PRN
  Administered 2021-09-21: 4 mg via INTRAVENOUS

## 2021-09-21 MED ORDER — KETOROLAC TROMETHAMINE 15 MG/ML IJ SOLN
INTRAMUSCULAR | Status: DC | PRN
  Administered 2021-09-21: 15 mg via INTRAVENOUS

## 2021-09-21 MED ORDER — DEXAMETHASONE SODIUM PHOSPHATE 10 MG/ML IJ SOLN: 4.0000 mg | INTRAMUSCULAR | Status: DC

## 2021-09-21 MED ORDER — PROPOFOL 10 MG/ML IV BOLUS
INTRAVENOUS | Status: AC
  Filled 2021-09-21: qty 20

## 2021-09-21 SURGICAL SUPPLY — 19 items
CATH ROBINSON RED A/P 14FR (CATHETERS) ×1 IMPLANT
DEPRESSOR TONGUE BLADE STERILE (MISCELLANEOUS) ×2 IMPLANT
GAUZE 4X4 16PLY ~~LOC~~+RFID DBL (SPONGE) ×2 IMPLANT
GLOVE BIO SURGEON STRL SZ 6 (GLOVE) ×3 IMPLANT
GLOVE BIO SURGEON STRL SZ 6.5 (GLOVE) ×1 IMPLANT
GLOVE BIOGEL PI IND STRL 7.0 (GLOVE) IMPLANT
GLOVE BIOGEL PI INDICATOR 7.0 (GLOVE) ×3
GOWN STRL REUS W/TWL LRG LVL3 (GOWN DISPOSABLE) ×4 IMPLANT
KIT TURNOVER CYSTO (KITS) ×2 IMPLANT
MANIFOLD NEPTUNE II (INSTRUMENTS) ×1 IMPLANT
NS IRRIG 1000ML POUR BTL (IV SOLUTION) ×1 IMPLANT
PACK VAGINAL WOMENS (CUSTOM PROCEDURE TRAY) ×2 IMPLANT
PAD PREP 24X48 CUFFED NSTRL (MISCELLANEOUS) ×2 IMPLANT
SEAL ROD LENS SCOPE MYOSURE (ABLATOR) ×1 IMPLANT
SOL PREP POV-IOD 4OZ 10% (MISCELLANEOUS) ×1 IMPLANT
SPIKE FLUID TRANSFER (MISCELLANEOUS) ×1 IMPLANT
SWAB OB GYN 8IN STERILE 2PK (MISCELLANEOUS) ×4 IMPLANT
TOWEL OR 17X26 10 PK STRL BLUE (TOWEL DISPOSABLE) ×2 IMPLANT
VACUUM HOSE 7/8X10 W/ WAND (MISCELLANEOUS) ×1 IMPLANT

## 2021-09-21 NOTE — Progress Notes (Signed)
Called daughter.  Discussed in detail findings from surgery.  Patient has had almost complete fusion of her labia.  There is a small, 1-2 cm opening at the posterior aspect of what I think was her vagina/posterior fourchette.  With some manipulation, I can place my finger and only 1-2 cm.  There is no urethra evident.  What I suspect has happened is that the sensation she had of something "crawling" in her vagina is that what ever urine she is able to pass through her urethra is trapped within the space created by her fused labia.  I am surprised that she is able to empty her bladder at all.  In terms of next steps, we talked about 2 options.  One would be to plan for suprapubic catheter placement and consideration of a skinning vulvectomy for removal of the area involved by dysplasia.  I performed several more biopsies today to rule out underlying cancer or Paget's disease given her history.  The other option would be to have her see a urogynecologist who specializes in reconstructive surgery.  After discussing, the patient's daughter was interested in whether suprapubic catheter could be placed today to help resolve her symptoms and help her with emptying her bladder.  I spoke with Dr. Louis Meckel, who graciously offered to help with suprapubic catheter placement today.  The daughter understands that she will have to give consent on behalf of her mom as her power of attorney because the patient is still waking up from anesthesia.  Jeral Pinch MD Gynecologic Oncology

## 2021-09-21 NOTE — Discharge Instructions (Addendum)
AFTER SURGERY INSTRUCTIONS   Return to work:  1-2 days if applicable  TODAY DR. Berline Lopes TOOK BIOPSIES OF THE VULVA.    We recommend purchasing several bags of frozen green peas and dividing them into ziploc bags. You will want to keep these in the freezer and have them ready to use as ice packs to the vulvar incision. Once the ice pack is no longer cold, you can get another from the freezer. The frozen peas mold to your body better than a regular ice pack.    Activity: 1. Be up and out of the bed during the day.  Take a nap if needed.  You may walk up steps but be careful and use the hand rail.  Stair climbing will tire you more than you think, you may need to stop part way and rest.    2. No lifting or straining for 2 weeks over 10 pounds. No pushing, pulling, straining for 2 weeks.   3. No driving for minimum 24 HOURS after surgery.  Do not drive if you are taking narcotic pain medicine and make sure that your reaction time has returned.    4. You can shower as soon as the next day after surgery. Shower daily. No tub baths or submerging your body in water until cleared by your surgeon. If you have the soap that was given to you by pre-surgical testing that was used before surgery, you do not need to use it afterwards because this can irritate your incisions.    5. No sexual activity and nothing in the vagina for 4 weeks.   6. You may experience vaginal spotting and discharge after surgery.  The spotting is normal but if you experience heavy bleeding, call our office.   7. Take Tylenol or ibuprofen first for pain and only use narcotic pain medication for severe pain not relieved by the Tylenol or Ibuprofen.  Monitor your Tylenol intake to a max of 4,000 mg in a 24 hour period. You can alternate these medications after surgery.   Diet: 1. Low sodium Heart Healthy Diet is recommended but you are cleared to resume your normal (before surgery) diet after your procedure.   2. It is safe to use a  laxative, such as Miralax or Colace, if you have difficulty moving your bowels. You have been prescribed Sennakot at bedtime every evening to keep bowel movements regular and to prevent constipation.     Wound Care: 1. Keep clean and dry.  Shower daily.   Reasons to call the Doctor: Fever - Oral temperature greater than 100.4 degrees Fahrenheit Foul-smelling vaginal discharge Difficulty urinating Nausea and vomiting Increased pain at the site of the incision that is unrelieved with pain medicine. Difficulty breathing with or without chest pain New calf pain especially if only on one side Sudden, continuing increased vaginal bleeding with or without clots.   Contacts: For questions or concerns you should contact:   Dr. Jeral Pinch at [phone]   Joylene John, NP at [phone]   After Hours: call [phone] and have the GYN Oncologist paged/contacted (after 5 pm or on the weekends).   Messages sent via mychart are for non-urgent matters and are not responded to after hours so for urgent needs, please call the after hours number.   Post Anesthesia Home Care Instructions  Activity: Get plenty of rest for the remainder of the day. A responsible individual must stay with you for 24 hours following the procedure.  For the next 24 hours,  DO NOT: -Drive a car -Paediatric nurse -Drink alcoholic beverages -Take any medication unless instructed by your physician -Make any legal decisions or sign important papers.  Meals: Start with liquid foods such as gelatin or soup. Progress to regular foods as tolerated. Avoid greasy, spicy, heavy foods. If nausea and/or vomiting occur, drink only clear liquids until the nausea and/or vomiting subsides. Call your physician if vomiting continues.  Special Instructions/Symptoms: Your throat may feel dry or sore from the anesthesia or the breathing tube placed in your throat during surgery. If this causes discomfort, gargle with warm salt  water. The discomfort should disappear within 24 hours.   Follow-up with Dr. Louis Meckel (Urologist) in 5 weeks or as scheduled with [facility] [phone]

## 2021-09-21 NOTE — Brief Op Note (Signed)
09/21/2021  3:42 PM  PATIENT:  Margeart Serratore  66 y.o. female  PRE-OPERATIVE DIAGNOSIS:  VULVAR INTRAEPITHELIAL NEOPLASIA THREE  POST-OPERATIVE DIAGNOSIS:  VULVAR INTRAEPITHELIAL NEOPLASIA THREE  PROCEDURE:  Procedure(s): VAGINOSCOPY, VULVAR BIOPSIES (N/A)  SURGEON:  Surgeon(s) and Role:    * Lafonda Mosses, MD - Primary    * Lahoma Crocker, MD - Assisting  EBL:  5 mL   BLOOD ADMINISTERED:none  DRAINS: none   LOCAL MEDICATIONS USED:  MARCAINE     SPECIMEN:  vulvar biopsies  DISPOSITION OF SPECIMEN:  PATHOLOGY  COUNTS:  YES  TOURNIQUET:  * No tourniquets in log *  DICTATION: .Note written in EPIC  PLAN OF CARE: Discharge to home after PACU  PATIENT DISPOSITION:  PACU - hemodynamically stable.   Delay start of Pharmacological VTE agent (>24hrs) due to surgical blood loss or risk of bleeding: not applicable

## 2021-09-21 NOTE — Op Note (Signed)
PATIENT: Ellen Ryan  ENCOUNTER DATE: 09/21/21  Preop Diagnosis: Remote history of Paget's disease, recent history of vulvar high-grade dysplasia does not tolerate office exams  Postoperative Diagnosis: Almost complete fusion of labia completely obscuring the urethra  Surgery: Exam Under anesthesia, vulvar biopsies  Surgeons:  Valarie Cones, MD  Anesthesia: LMA  Estimated blood loss: 40 ml  IVF:  see I&O flowsheet   Urine output: n/a   Complications: None apparent  Pathology: Multiple vulvar biopsies  Operative findings: Picture below.  There is been complete fusion from the superior aspect of the labia to almost the posterior vagina.  There is a small, 1-2 cm opening at what appears to be the posterior fourchette.  I am able to dilate this minimally.  There is an area of somewhat ulcerated tissue in a butterfly pattern that extends superiorly and laterally to this defect.  Multiple biopsies were taken from 3 and 7:00.  No urethral meatus was noted.  Given findings, I suspect that the patient's symptoms have been related to what ever small amount of urine she is able to pass from her urethra that is trapped behind her fused labia.    Procedure: The patient was identified in the preoperative holding area. Informed consent was signed on the chart. Patient was seen history was reviewed and exam was performed.   The patient was then taken to the operating room and placed in the supine position with SCD hose on. General anesthesia was then induced without difficulty. She was then placed in the dorsolithotomy position. The perineum was prepped with Betadine.   Timeout was performed the patient, procedure, antibiotic, allergy, and length of procedure.   Exam was performed with findings noted above.  Gentle dilation was attempted with what appeared to be a small vaginal opening.  This was unsuccessful.  On rectovaginal exam, no nodularity was palpated.  Several biopsies were taken from the  ulcerated lesion to be sent for pathology.  Hemostasis was achieved with monopolar electrocautery.  Given findings, and what will be a much more extensive surgery, procedure was completed at this time.  Daughter was called and findings were reviewed with her as detailed in a separate note.  All instrument, suture, laparotomy, Ray-Tec, and needle counts were correct x2. The patient tolerated the procedure well and was taken recovery room in stable condition.   Jeral Pinch MD Gynecologic Oncology

## 2021-09-21 NOTE — Procedures (Cosign Needed)
Bedside Suprapubic Insertion Note   Indications: 66 y.o. female with chronic difficulty urinating and exam under anesthesia today which showed completely fused vulva from likely malignancy. Urology was consulted in the PACU to place a suprapubic tube.   Pre-operative Diagnosis: Urinary retention, vulvar intraepithelial neoplasia   Post-operative Diagnosis: Same  Surgeon:  Louis Meckel MD  Assistants: Aldine Contes, MD  Procedure Details  Consent was obtained from the patient's daughter prior to the procedure. 10cc of 1% lidocaine was instilled into the skin just above the pubic bone. Patient was placed in the supine position, prepped with Betadine and draped in the usual sterile fashion.  We attempted to place a spinal needle just over the pubic bone into the bladder based on feel. When this was unsuccessful we elected to do this under ultrasound guidance. Ultrasound showed a significantly distended bladder approximately 5cm underneath the skin. The finder needle was redirected under ultrasound guidance into the bladder with immediate return of clear yellow urine. We then obtained a 14Fr looped suprapubic tube kit and loaded the suprapubic tube on the insertion needle. Using our finder needle as a guide, we passed the suprapubic tube into the bladder under ultrasound guidance. Removal of the insertion needle resulted in clear yellow urine return and a good curl in the bladder. The string was wrapped around the suprapubic tube and tapped down. The suprapubic tube was sutured into place and stat lock applied. There was return of 400cc clear yellow urine within minutes.                Complications: None; patient tolerated the procedure well.  Plan:   1. Patient will be discharged on 3 days of keflex.   2. Follow up in clinic with Dr. Louis Meckel in 5 weeks 3. Plan for suprapubic tube upsize and then monthly exchanges

## 2021-09-21 NOTE — Transfer of Care (Signed)
Immediate Anesthesia Transfer of Care Note  Patient: Ellen Ryan  Procedure(s) Performed: Procedure(s) (LRB): VAGINOSCOPY, VULVAR BIOPSIES (N/A)  Patient Location: PACU  Anesthesia Type: General  Level of Consciousness: drowsy  Airway & Oxygen Therapy: Patient Spontanous Breathing and Patient connected to face mask oxygen, oral airway remaining  Post-op Assessment: Report given to PACU RN and Post -op Vital signs reviewed and stable  Post vital signs: Reviewed and stable  Complications: No apparent anesthesia complications  Last Vitals:  Vitals Value Taken Time  BP 108/63 09/21/21 1519  Temp    Pulse 63 09/21/21 1522  Resp 15 09/21/21 1522  SpO2 99 % 09/21/21 1522  Vitals shown include unvalidated device data.  Last Pain:  Vitals:   09/21/21 1248  TempSrc: Oral  PainSc: 4       Patients Stated Pain Goal: 4 (08/67/61 9509)  Complications: No notable events documented.

## 2021-09-21 NOTE — Interval H&P Note (Signed)
History and Physical Interval Note:  09/21/2021 2:12 PM  Ellen Ryan  has presented today for surgery, with the diagnosis of Loco.  The various methods of treatment have been discussed with the patient and family. After consideration of risks, benefits and other options for treatment, the patient has consented to  Procedure(s): CO2 LASER APPLICATION TO VULVA, VAGINOSCOPY, VULVAR BIOPSY (N/A) as a surgical intervention.  The patient's history has been reviewed, patient examined, no change in status, stable for surgery.  I have reviewed the patient's chart and labs.  Questions were answered to the patient's satisfaction.     Lafonda Mosses

## 2021-09-21 NOTE — Anesthesia Preprocedure Evaluation (Signed)
Anesthesia Evaluation  Patient identified by MRN, date of birth, ID band Patient awake    Reviewed: Allergy & Precautions, NPO status , Patient's Chart, lab work & pertinent test results  Airway Mallampati: II  TM Distance: >3 FB Neck ROM: Full    Dental no notable dental hx.    Pulmonary neg pulmonary ROS,    Pulmonary exam normal breath sounds clear to auscultation       Cardiovascular hypertension, Pt. on medications Normal cardiovascular exam Rhythm:Regular Rate:Normal  TTE 2018 - Left ventricle: The cavity size was normal. Systolic function was  normal. The estimated ejection fraction was in the range of 60%  to 65%. Wall motion was normal; there were no regional wall  motion abnormalities. Doppler parameters are consistent with  abnormal left ventricular relaxation (grade 1 diastolic  dysfunction).  - Aortic valve: Trileaflet; moderately thickened, moderately  calcified leaflets. Valve mobility was restricted. There was very  mild stenosis. Peak velocity (S): 222 cm/s. Mean gradient (S): 11  mm Hg. Valve area (VTI): 1.85 cm^2. Valve area (Vmax): 1.65 cm^2.  - Mitral valve: Calcified annulus.  - Left atrium: The atrium was moderately dilated.   Neuro/Psych negative neurological ROS  negative psych ROS   GI/Hepatic negative GI ROS, Neg liver ROS,   Endo/Other  diabetes, Type 2, Oral Hypoglycemic AgentsObese BMI 37  Renal/GU negative Renal ROS  negative genitourinary   Musculoskeletal  (+) Arthritis ,   Abdominal   Peds  Hematology negative hematology ROS (+)   Anesthesia Other Findings Paget's disease of vulva  Reproductive/Obstetrics                             Anesthesia Physical Anesthesia Plan  ASA: 2  Anesthesia Plan: General   Post-op Pain Management: Tylenol PO (pre-op)*   Induction: Intravenous  PONV Risk Score and Plan: 3 and Treatment may vary due  to age or medical condition, Midazolam, Ondansetron and Dexamethasone  Airway Management Planned: LMA  Additional Equipment:   Intra-op Plan:   Post-operative Plan: Extubation in OR  Informed Consent: I have reviewed the patients History and Physical, chart, labs and discussed the procedure including the risks, benefits and alternatives for the proposed anesthesia with the patient or authorized representative who has indicated his/her understanding and acceptance.     Dental advisory given  Plan Discussed with: Anesthesiologist  Anesthesia Plan Comments:         Anesthesia Quick Evaluation

## 2021-09-21 NOTE — Anesthesia Procedure Notes (Signed)
Procedure Name: LMA Insertion Date/Time: 09/21/2021 2:30 PM Performed by: Gwyndolyn Saxon, CRNA Pre-anesthesia Checklist: Patient identified, Emergency Drugs available, Suction available and Patient being monitored Patient Re-evaluated:Patient Re-evaluated prior to induction Oxygen Delivery Method: Circle system utilized Preoxygenation: Pre-oxygenation with 100% oxygen Induction Type: IV induction Ventilation: Mask ventilation without difficulty LMA: LMA inserted LMA Size: 4.0 Number of attempts: 2 Placement Confirmation: positive ETCO2 and breath sounds checked- equal and bilateral Tube secured with: Tape Dental Injury: Teeth and Oropharynx as per pre-operative assessment

## 2021-09-22 ENCOUNTER — Telehealth: Payer: Self-pay | Admitting: *Deleted

## 2021-09-22 ENCOUNTER — Encounter (HOSPITAL_BASED_OUTPATIENT_CLINIC_OR_DEPARTMENT_OTHER): Payer: Self-pay | Admitting: Gynecologic Oncology

## 2021-09-22 LAB — SURGICAL PATHOLOGY

## 2021-09-22 NOTE — Anesthesia Postprocedure Evaluation (Signed)
Anesthesia Post Note  Patient: Ellen Ryan  Procedure(s) Performed: VAGINOSCOPY, VULVAR BIOPSIES (Vulva)     Patient location during evaluation: PACU Anesthesia Type: General Level of consciousness: awake and alert Pain management: pain level controlled Vital Signs Assessment: post-procedure vital signs reviewed and stable Respiratory status: spontaneous breathing, nonlabored ventilation, respiratory function stable and patient connected to nasal cannula oxygen Cardiovascular status: blood pressure returned to baseline and stable Postop Assessment: no apparent nausea or vomiting Anesthetic complications: no   No notable events documented.  Last Vitals:  Vitals:   09/21/21 1715 09/21/21 1803  BP: 116/67 136/75  Pulse: 71 66  Resp: 17 14  Temp: 36.7 C 36.7 C  SpO2: 93% 94%    Last Pain:  Vitals:   09/21/21 1700  TempSrc:   PainSc: 0-No pain                 Jedadiah Abdallah L Ireta Pullman

## 2021-09-22 NOTE — Telephone Encounter (Signed)
Attempted to speak with pt today to let her know that I spoke with [facility] [phone]) today and they stated because the pt is not an established pt with their practice the pt would need to call and schedule a follow up appointment with Dr.Herrick. LVM for pt's daughter for a return call to discuss information.

## 2021-09-22 NOTE — Telephone Encounter (Signed)
Spoke with Ellen Ryan this morning. She states her mother is eating, drinking and urinating well. She has had a BM and is passing gas. She is taking senokot as prescribed and encouraged her to drink plenty of water. She denies fever or chills. Incisions are dry and intact. She states her pain has improved. Her pain is controlled with tylenol and Norco.  She states the suprapubic cathter has been very helpful to the patient and is relieving a lot of pain. It is working and draining well.   Instructed to call office with any fever, chills, purulent drainage, uncontrolled pain or any other questions or concerns. Patient verbalizes understanding.   Pt aware of post op appointments as well as the office number [phone] and after hours number [phone] to call if she has any questions or concerns

## 2021-09-22 NOTE — Telephone Encounter (Signed)
Spoke with Ellen Ryan to inform her that they have to call [facility] to set up a follow up appt with Dr.Herrick. She stated they have the phone number on the discharge paperwork and she will give them a call and set up an appointment.

## 2021-09-23 ENCOUNTER — Ambulatory Visit: Payer: 59 | Admitting: Physician Assistant

## 2021-09-23 ENCOUNTER — Emergency Department (HOSPITAL_BASED_OUTPATIENT_CLINIC_OR_DEPARTMENT_OTHER)
Admission: EM | Admit: 2021-09-23 | Discharge: 2021-09-23 | Disposition: A | Discharge status: Home / Self Care | Payer: 59 | Attending: Emergency Medicine | Admitting: Emergency Medicine

## 2021-09-23 ENCOUNTER — Emergency Department (HOSPITAL_BASED_OUTPATIENT_CLINIC_OR_DEPARTMENT_OTHER): Payer: 59

## 2021-09-23 ENCOUNTER — Other Ambulatory Visit: Payer: Self-pay

## 2021-09-23 ENCOUNTER — Encounter (HOSPITAL_BASED_OUTPATIENT_CLINIC_OR_DEPARTMENT_OTHER): Payer: Self-pay | Admitting: Obstetrics and Gynecology

## 2021-09-23 DIAGNOSIS — T83010A Breakdown (mechanical) of cystostomy catheter, initial encounter: Secondary | ICD-10-CM

## 2021-09-23 DIAGNOSIS — T83098A Other mechanical complication of other indwelling urethral catheter, initial encounter: Secondary | ICD-10-CM | POA: Diagnosis present

## 2021-09-23 DIAGNOSIS — Z7984 Long term (current) use of oral hypoglycemic drugs: Secondary | ICD-10-CM | POA: Insufficient documentation

## 2021-09-23 DIAGNOSIS — E119 Type 2 diabetes mellitus without complications: Secondary | ICD-10-CM | POA: Diagnosis not present

## 2021-09-23 DIAGNOSIS — Z79899 Other long term (current) drug therapy: Secondary | ICD-10-CM | POA: Diagnosis not present

## 2021-09-23 DIAGNOSIS — I10 Essential (primary) hypertension: Secondary | ICD-10-CM | POA: Diagnosis not present

## 2021-09-23 DIAGNOSIS — R39198 Other difficulties with micturition: Secondary | ICD-10-CM | POA: Insufficient documentation

## 2021-09-23 LAB — CBC WITH DIFFERENTIAL/PLATELET
Abs Immature Granulocytes: 0.04 10*3/uL (ref 0.00–0.07)
Basophils Absolute: 0.1 10*3/uL (ref 0.0–0.1)
Basophils Relative: 0 %
Eosinophils Absolute: 0.1 10*3/uL (ref 0.0–0.5)
Eosinophils Relative: 1 %
HCT: 36.1 % (ref 36.0–46.0)
Hemoglobin: 10.8 g/dL — ABNORMAL LOW (ref 12.0–15.0)
Immature Granulocytes: 0 %
Lymphocytes Relative: 34 %
Lymphs Abs: 4.1 10*3/uL — ABNORMAL HIGH (ref 0.7–4.0)
MCH: 22.4 pg — ABNORMAL LOW (ref 26.0–34.0)
MCHC: 29.9 g/dL — ABNORMAL LOW (ref 30.0–36.0)
MCV: 74.7 fL — ABNORMAL LOW (ref 80.0–100.0)
Monocytes Absolute: 1 10*3/uL (ref 0.1–1.0)
Monocytes Relative: 8 %
Neutro Abs: 7 10*3/uL (ref 1.7–7.7)
Neutrophils Relative %: 57 %
Platelets: 333 10*3/uL (ref 150–400)
RBC: 4.83 MIL/uL (ref 3.87–5.11)
RDW: 16.2 % — ABNORMAL HIGH (ref 11.5–15.5)
WBC: 12.3 10*3/uL — ABNORMAL HIGH (ref 4.0–10.5)
nRBC: 0 % (ref 0.0–0.2)

## 2021-09-23 LAB — BASIC METABOLIC PANEL
Anion gap: 11 (ref 5–15)
BUN: 10 mg/dL (ref 8–23)
CO2: 28 mmol/L (ref 22–32)
Calcium: 9.8 mg/dL (ref 8.9–10.3)
Chloride: 102 mmol/L (ref 98–111)
Creatinine, Ser: 0.56 mg/dL (ref 0.44–1.00)
GFR, Estimated: >60 mL/min (ref >60)
Glucose, Bld: 97 mg/dL (ref 70–99)
Potassium: 3.9 mmol/L (ref 3.5–5.1)
Sodium: 141 mmol/L (ref 135–145)

## 2021-09-23 NOTE — ED Notes (Signed)
Called CN Brianna at [facility] ED and gave report on pt being transferred ED to ED, Dr. Zenia Resides is accepting EDP.

## 2021-09-23 NOTE — Discharge Instructions (Signed)
Followup with your doctor as needed

## 2021-09-23 NOTE — ED Provider Notes (Signed)
Patient transferred from med center to be seen by urology due to leaking catheter.  I spoke to urologist on-call, Dr. Diona Fanti, who requested that I remove patient  suprapubic catheter.  This was performed.  Patient to follow-up with urology    Lacretia Leigh, MD 09/23/21 1836

## 2021-09-23 NOTE — ED Provider Notes (Signed)
[facility] EMERGENCY DEPT Provider Note   CSN: [id number] Arrival date & time: 09/23/21  1206     History  Chief Complaint  Patient presents with   Catheter Leaking     Ellen Ryan is a 66 y.o. female.  Patient presents to the hospital complaining of malfunctioning suprapubic catheter.  The patient had a catheter placed on May 31 by Dr. Louis Meckel.  He was consulted by OB/GYN due to a fused urethra.  The patient states that no urine has come out of the catheter since 4 AM this morning.  She was able to pass a small amount of urine through her urethra.  140 mL residual noted on bladder scan immediately after voiding.  Past medical history significant for hyperlipidemia, history of dysplasia of vulva, type II DM, uterine fibroids, Paget's disease of vulva, grade 1 diastolic dysfunction, aortic valve stenosis, for intraepithelial neoplasia III, hypertension  HPI     Home Medications Prior to Admission medications   Medication Sig Start Date End Date Taking? Authorizing Provider  acetaminophen (TYLENOL) 325 MG tablet Take 650 mg by mouth every 6 (six) hours as needed.    [provider]  amLODipine (NORVASC) 10 MG tablet Take 1 tablet (10 mg total) by mouth daily. Patient taking differently: Take 10 mg by mouth at bedtime. 07/01/18   Charlott Rakes, MD  atorvastatin (LIPITOR) 40 MG tablet Take 1 tablet (40 mg total) by mouth daily. Patient taking differently: Take 40 mg by mouth at bedtime. 06/29/21 09/27/21  Allwardt, Randa Evens, PA-C  blood glucose meter kit and supplies KIT Dispense based on patient and insurance preference. Use up to four times daily as directed. 06/29/21   Allwardt, Randa Evens, PA-C  Blood Glucose Monitoring Suppl (TRUE METRIX METER) w/Device KIT Use as directed 12/27/16   Tresa Garter, MD  cephALEXin (KEFLEX) 500 MG capsule Take 1 capsule (500 mg total) by mouth 4 (four) times daily for 3 days. 09/21/21 09/24/21  Ardis Hughs, MD  gabapentin  (NEURONTIN) 300 MG capsule Take 1 capsule (300 mg total) by mouth 3 (three) times daily. 07/14/21 10/12/21  Allwardt, Alyssa M, PA-C  glucose blood (FREESTYLE LITE) test strip USE ONE STRIP TO TEST FOUR TIMES A DAY 07/25/21   Allwardt, Alyssa M, PA-C  HYDROcodone-acetaminophen (NORCO/VICODIN) 5-325 MG tablet Take 1 tablet by mouth every 6 (six) hours as needed for moderate pain or severe pain. Do not take and drive, do not take with oxycodone 09/12/21   Cross, Lenna Sciara D, NP  ibuprofen (ADVIL,MOTRIN) 200 MG tablet Take 200 mg by mouth 2 (two) times daily. Reminded to stop 48 hours prior to surgery    [provider]  Lancets (FREESTYLE) lancets USE ONE LANCET TO TEST FOUR TIMES A DAY 07/25/21   Allwardt, Alyssa M, PA-C  Lidocaine HCl 2.5 % GEL Apply 1 application. topically as needed. Apply to vulva as needed 09/08/21   Joylene John D, NP  MELATONIN PO Take by mouth.    [provider]  metFORMIN (GLUCOPHAGE) 500 MG tablet Take 1 tablet (500 mg total) by mouth 2 (two) times daily with a meal. 07/01/18   Charlott Rakes, MD  Multiple Vitamin (MULTIVITAMIN WITH MINERALS) TABS tablet Take 1 tablet by mouth daily. Centrum    [provider]  Omega-3 Fatty Acids (FISH OIL) 1000 MG CAPS Take by mouth.    [provider]  polyethylene glycol (MIRALAX / GLYCOLAX) packet Take 17 g by mouth daily as needed. As needed  [provider]  senna (SENOKOT) 8.6 MG TABS tablet Take 1 tablet (8.6 mg total) by mouth at bedtime. 10/19/17   Everitt Amber, MD      Allergies    Oxycodone    Review of Systems   Review of Systems  Constitutional:  Negative for fever.  Respiratory:  Negative for shortness of breath.   Cardiovascular:  Negative for chest pain.  Gastrointestinal:  Negative for abdominal pain and nausea.  Genitourinary:  Positive for difficulty urinating. Negative for dysuria, flank pain and frequency.   Physical Exam Updated Vital Signs BP 116/70 (BP Location: Left  Arm)   Pulse 93   Temp 98.3 F (36.8 C)   Resp 16   Ht '4\' 10"'  (1.473 m)   Wt 78 kg   SpO2 93%   BMI 35.94 kg/m  Physical Exam Vitals and nursing note reviewed.  Constitutional:      General: She is not in acute distress. HENT:     Head: Normocephalic and atraumatic.  Eyes:     Conjunctiva/sclera: Conjunctivae normal.  Cardiovascular:     Rate and Rhythm: Normal rate and regular rhythm.     Pulses: Normal pulses.  Pulmonary:     Effort: Pulmonary effort is normal.     Breath sounds: Normal breath sounds.  Abdominal:     Palpations: Abdomen is soft.     Tenderness: There is no abdominal tenderness.     Comments: Suprapubic catheter in place  Musculoskeletal:        General: Normal range of motion.     Cervical back: Normal range of motion and neck supple.  Skin:    General: Skin is warm and dry.     Capillary Refill: Capillary refill takes less than 2 seconds.  Neurological:     Mental Status: She is alert.    ED Results / Procedures / Treatments   Labs (all labs ordered are listed, but only abnormal results are displayed) Labs Reviewed  CBC WITH DIFFERENTIAL/PLATELET - Abnormal; Notable for the following components:      Result Value   WBC 12.3 (*)    Hemoglobin 10.8 (*)    MCV 74.7 (*)    MCH 22.4 (*)    MCHC 29.9 (*)    RDW 16.2 (*)    Lymphs Abs 4.1 (*)    All other components within normal limits  BASIC METABOLIC PANEL    EKG None  Radiology CT PELVIS WO CONTRAST  Result Date: 09/23/2021 CLINICAL DATA:  Dislodged suprapubic catheter. EXAM: CT PELVIS WITHOUT CONTRAST TECHNIQUE: Multidetector CT imaging of the pelvis was performed following the standard protocol without intravenous contrast. RADIATION DOSE REDUCTION: This exam was performed according to the departmental dose-optimization program which includes automated exposure control, adjustment of the mA and/or kV according to patient size and/or use of iterative reconstruction technique. COMPARISON:   CT pelvis dated June 03, 2021. FINDINGS: Urinary Tract: Dislodged suprapubic catheter with tip in the left subcutaneous soft tissues of the left groin. Wall thickening of the urinary bladder and perivesicular fat stranding, most pronounced along the left superior wall with small locule of air seen on series 4, image 285, likely related to suprapubic catheter placement. Bowel:  Unremarkable visualized pelvic bowel loops. Vascular/Lymphatic: No pathologically enlarged lymph nodes. Atherosclerotic disease of the iliofemoral arteries. Reproductive: Fibroid uterus, of the fibroids are calcified. No adnexal masses. Other:  Small fat containing periumbilical hernia. Musculoskeletal: Mild degenerative changes of the lower lumbar spine. No suspicious bone lesions identified.  IMPRESSION: 1. Dislodged suprapubic catheter with tip in the left subcutaneous soft tissues of the left groin. 2. Asymmetric wall thickening and perivesicular fat stranding of the left superior bladder, likely postprocedural. Electronically Signed   By: Yetta Glassman M.D.   On: 09/23/2021 16:38    Procedures Procedures    Medications Ordered in ED Medications - No data to display  ED Course/ Medical Decision Making/ A&P                           Medical Decision Making Amount and/or Complexity of Data Reviewed Labs: ordered. Radiology: ordered.   The patient presents to the hospital with a chief concern of dislodged suprapubic catheter.  I requested consultation with the urologist on-call, Dr.Dahlstedt.  He recommended attempting to flush the catheter to see if it would begin draining.  Upon flushing the catheter saline immediately came out at the skin.  I reconsulted and after talking to a colleague he recommended intervention by interventional radiology.  I ordered labs.  Relevant results include WBC 12.3, hemoglobin 10.8.  I ordered imaging and interpreted including CT pelvis without contrast. 1. Dislodged suprapubic  catheter with tip in the left subcutaneous soft tissues of the left groin. 2. Asymmetric wall thickening and perivesicular fat stranding of the left superior bladder, likely postprocedural. I agree with the radiologist's findings  I consulted interventional radiology, Dr.Henn, who agreed to consult on the patient. Recommended ED to ED transfer  I spoke with emergency physician, Dr. Zenia Resides, who agreed to accept patient at [facility]  Transfer to [facility]. [place] to notify Dr.Henn once patient arrives.  Final Clinical Impression(s) / ED Diagnoses Final diagnoses:  Suprapubic catheter dysfunction, initial encounter [facility])    Rx / DC Orders ED Discharge Orders     None         Ronny Bacon 09/23/21 [place], DO 09/26/21 2022

## 2021-09-23 NOTE — ED Triage Notes (Signed)
Patient presents to the ER for her suprapubic catheter that is leaking. States it was placed on Wednesday

## 2021-09-28 ENCOUNTER — Telehealth: Payer: Self-pay | Admitting: Gynecologic Oncology

## 2021-09-28 NOTE — Telephone Encounter (Signed)
Called patient's daughter. Discussed biopsy results. Also discussed suprapubic catheter falling out. She is able to void, but having similar symptoms to before (urethra is covered by fused labia, urine is sitting within a potential space and then ultimately making its way out). Systems of "crawling" had resolved for period of time suprapubic catheter was in place.  Discussed options again of replacing indwelling suprapubic catheter and plan for laser ablation or vulvectomy/distal vaginectomy and likely closure of remaining small area of posterior vagina that exists versus having her see Urogyn regarding possibility of more reconstructive surgery (separating fused labia). She will discuss with her mother and call me back.  Jeral Pinch MD Gynecologic Oncology

## 2021-10-05 ENCOUNTER — Encounter: Payer: Self-pay | Admitting: *Deleted

## 2021-10-31 ENCOUNTER — Other Ambulatory Visit: Payer: Self-pay

## 2021-10-31 ENCOUNTER — Inpatient Hospital Stay: Payer: 59 | Attending: Obstetrics & Gynecology | Admitting: Gynecologic Oncology

## 2021-10-31 ENCOUNTER — Encounter: Payer: Self-pay | Admitting: Gynecologic Oncology

## 2021-10-31 VITALS — BP 133/74 | HR 102 | Temp 98.1°F | Resp 16 | Ht <= 58 in | Wt 168.0 lb

## 2021-10-31 DIAGNOSIS — Q525 Fusion of labia: Secondary | ICD-10-CM

## 2021-10-31 DIAGNOSIS — Z9889 Other specified postprocedural states: Secondary | ICD-10-CM

## 2021-10-31 DIAGNOSIS — D071 Carcinoma in situ of vulva: Secondary | ICD-10-CM

## 2021-10-31 MED ORDER — IMIQUIMOD 5 % EX CREA: TOPICAL_CREAM | CUTANEOUS | 0 refills | Status: DC

## 2021-10-31 NOTE — Patient Instructions (Signed)
I will send a referral to [facility] for one of the specialists as we discussed. I will also ask the Urology group here if anyone would feel comfortable performing this procedure.  I have sent Imiquimod to your pharmacy. Please use it just in the area that is bothering you. You can start by using it once or twice a week at night and then increase to three times a week. Please see the printed instructions.

## 2021-10-31 NOTE — Progress Notes (Signed)
Gynecologic Oncology Return Clinic Visit  10/31/21  Reason for Visit: follow-up, treatment planning  Treatment History: Ellen Ryan is 66 year old female initially seen at the request of [name] for extramammary pagets disease.  The patient is visiting her daughter now and lives in [place]. She reported a 2 year history of vulvar ulceration. She was seen by [name] for this problem in April, 2018 and was taken to the OR for an examination under anesthesia on 09/01/16. Extensive ulceration was present. 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     On 09/21/16, she underwent a complete simple vulvectomy.  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 No invasive carcinoma, negative margins. Post-operative course was uneventful and she was discharged with a foley catheter for one week post-op.    She has urinary urgency not resolved by medications. She feels her urine deviates in stream. Since early April, 2019 she developed irritation of the vulva made better with silvadine cream.   Given that she did not tolerate an office examination she was taken to the operating room on Sep 13, 2017 for examination under anesthesia and vulvar biopsies.  Intraoperative findings were significant for subtle acetowhite changes immediately lateral on the right side of the urethral meatus.  There also acetowhite changes to the midline distal vagina.  Vagina was completely agglutinated.  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  The posterior vaginal introitus biopsy revealed benign normal squamous mucosa.   On 10/19/17 she underwent CO2 laser to the urethral meatus.   In May, 2020 she started experiencing a burning sensation at the skin of her introitus/vulva. She had been prescribed clobetasol but had been using silvadine instead of clobetasol. In 10/2018 was recommended to use nightly clobetasol.   Patient represented for care in 07/2021 with sensation of  "skin crawling". Had self treated for possible pinworms. Due to pain limiting exam, EUA and biopsies performed on 08/31/21. Bilateral biopsies were taken showing VIN3 with associated erosion and inflammation. No Paget's disease identified in these biopsies.   Underwent exam under anesthesia on 09/21/2021 as well as vulvar biopsies.  Findings notable for them with complete fusion of the superior labia with only a small, 1-2 cm opening at the posterior fourchette.  Given symptoms suspected to be related to urine being trapped under her fused labia, recommendation was made for suprapubic catheter which was placed in the PACU.  Unfortunately, this got dislodged several days after surgery.  1 vulvar biopsy showed mucosal inflammation and granulation tissue, no dysplasia.  Second vulvar biopsy showed VIN 3, no invasive carcinoma.  Interval History: Continues to have vulvar pain.  Also continues to have sensation of something crawling along her right inner thigh and within her vagina.  Is able to urinate, sometimes with a more forceful stream, and other times with very little output.  Using topical lidocaine with some relief on her vulva.  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    Past Surgical History:  Procedure Laterality Date   CO2 LASER APPLICATION N/A 7/58/8325   Procedure: CO2 LASER APPLICATION OF THE VULVA;  Surgeon: Everitt Amber, MD;  Location: [facility];  Service: Gynecology;  Laterality: N/A;   CO2 LASER APPLICATION N/A 4/98/2641   Procedure: VAGINOSCOPY, VULVAR BIOPSIES;  Surgeon: Lafonda Mosses, MD;  Location: [facility];  Service: Gynecology;  Laterality: N/A;   EUA/ VULVA BIOPSY  09-01-2016  [name]  Vass N/A 09/21/2016   Procedure: RADICAL VULVECTOMY COMPLETE;  Surgeon: Everitt Amber, MD;  Location: [facility];  Service: Gynecology;  Laterality: N/A;   TRANSTHORACIC ECHOCARDIOGRAM  01/16/2017   ef 60-65%,  grade 1 diastolic dysfunction/  very mild AV stenosis with moderately thickened and calcified leaflets (valve area 1.85cm^2)/  moderate LAE   VULVA /PERINEUM BIOPSY N/A 09/13/2017   Procedure: VULVAR BIOPSY;  Surgeon: Everitt Amber, MD;  Location: [facility];  Service: Gynecology;  Laterality: N/A;   VULVA Milagros Loll BIOPSY N/A 08/31/2021   Procedure: VULVAR BIOPSY;  Surgeon: Lahoma Crocker, MD;  Location: [facility];  Service: Gynecology;  Laterality: N/A;    Family History  Problem Relation Age of Onset   Stroke Mother    Arthritis Mother    Diabetes Father    Cancer Daughter     Social History   Socioeconomic History   Marital status: Widowed    Spouse name: Not on file   Number of children: Not on file   Years of education: Not on file   Highest education level: Not on file  Occupational History   Not on file  Tobacco Use   Smoking status: Never   Smokeless tobacco: Never  Vaping Use   Vaping Use: Never used  Substance and Sexual Activity   Alcohol use: Not Currently   Drug use: No   Sexual activity: Not on file  Other Topics Concern   Not on file  Social History Narrative   Not on file   Social Determinants of Health   Financial Resource Strain: Not on file  Food Insecurity: Not on file  Transportation Needs: Not on file  Physical Activity: Not on file  Stress: Not on file  Social Connections: Not on file     Current Medications:  Current Outpatient Medications:    acetaminophen (TYLENOL) 325 MG tablet, Take 650 mg by mouth every 6 (six) hours as needed., Disp: , Rfl:    amLODipine (NORVASC) 10 MG tablet, Take 1 tablet (10 mg total) by mouth daily. (Patient taking differently: Take 10 mg by mouth at bedtime.), Disp: 30 tablet, Rfl: 6   blood glucose meter kit and supplies KIT, Dispense based on patient and insurance preference. Use up to four times daily as directed., Disp: 1 each, Rfl: 0   Blood Glucose Monitoring Suppl (TRUE METRIX METER) w/Device KIT, Use as directed, Disp: 1 kit, Rfl: 0   glucose blood (FREESTYLE LITE) test strip, USE ONE STRIP TO TEST FOUR TIMES A DAY, Disp: 100 strip, Rfl: 0   ibuprofen (ADVIL,MOTRIN) 200 MG tablet, Take 200 mg by mouth 2 (two) times daily. Reminded to stop 48 hours prior to surgery, Disp: , Rfl:    Lancets (FREESTYLE) lancets, USE ONE LANCET TO TEST FOUR TIMES A DAY, Disp: 100 each, Rfl: 0  MELATONIN PO, Take by mouth., Disp: , Rfl:    metFORMIN (GLUCOPHAGE) 500 MG tablet, Take 1 tablet (500 mg total) by mouth 2 (two) times daily with a meal., Disp: 60 tablet, Rfl: 6   Multiple Vitamin (MULTIVITAMIN WITH MINERALS) TABS tablet, Take 1 tablet by mouth daily. Centrum, Disp: , Rfl:    Omega-3 Fatty Acids (FISH OIL) 1000 MG CAPS, Take by mouth., Disp: , Rfl:    polyethylene glycol (MIRALAX / GLYCOLAX) packet, Take 17 g by mouth daily as needed. As needed, Disp: , Rfl:    senna (SENOKOT) 8.6 MG TABS tablet, Take 1 tablet (8.6 mg total) by mouth at bedtime., Disp: 30 each, Rfl: 0   atorvastatin (LIPITOR) 40 MG tablet, Take 1 tablet (40 mg total) by mouth daily. (Patient taking differently: Take 40 mg by mouth at bedtime.), Disp: 90 tablet, Rfl: 3   imiquimod (ALDARA) 5 % cream, Apply topically 3 (three) times a week., Disp: 12 each, Rfl: 0  Review of Systems: Denies appetite changes, fevers, chills, fatigue, unexplained weight changes. Denies hearing loss,  neck lumps or masses, mouth sores, ringing in ears or voice changes. Denies cough or wheezing.  Denies shortness of breath. Denies chest pain or palpitations. Denies leg swelling. Denies abdominal distention, pain, blood in stools, constipation, diarrhea, nausea, vomiting, or early satiety. Denies pain with intercourse, dysuria, frequency, hematuria or incontinence. Denies hot flashes, pelvic pain, vaginal bleeding or vaginal discharge.   Denies joint pain, back pain or muscle pain/cramps. Denies itching, rash, or wounds. Denies dizziness, headaches, numbness or seizures. Denies swollen lymph nodes or glands, denies easy bruising or bleeding. Denies anxiety, depression, confusion, or decreased concentration.  Physical Exam: BP 133/74 (BP Location: Right Arm, Patient Position: Sitting)   Pulse (!) 102   Temp 98.1 F (36.7 C) (Oral)   Resp 16   Ht _0  (1.473 m)   Wt 168 lb (76.2 kg)   SpO2 100%   BMI 35.11 kg/m  General: Alert, oriented, no acute distress. HEENT: Normocephalic, atraumatic, sclera anicteric. Chest: Unlabored breathing on room air.  Laboratory & Radiologic Studies: None new  Assessment & Plan: Odester Radde is a 66 y.o. woman with history of vulvar dysplasia, recent biopsy showing high-grade vulvar dysplasia, with almost complete labial fusion and obliteration of her vagina.  Discussed in detail again with the patient and her daughter findings at the time of surgery.  We reviewed biopsies taken, 1 of which showed high-grade dysplasia (from the right vulva).  Also discussed findings consistent with labial fusion, obscuring any identification of the patient's urethra.  I have previously discussed with them that the patient's sensation of crawling or something moving within her vagina I think is secondary to urine being at least partially trapped because of the labial fusion.  Options that I previously discussed for the them included skinning vulvectomy to remove the  area of vulvar dysplasia with plan for graft.  If we take this route, while we could see if the patient continues to be able to empty her bladder somewhat on her own, I also suggested consideration of placing a permanent suprapubic catheter.  The other option we have discussed is to partner with somebody who specializes in labial reconstruction (likely a urogynecologist) to see if we can reestablish some more normal-appearing vulvar anatomy.  I discussed risk with this including significant risk of repeat labial/vulvar fusion.  I will reach out to my colleagues at [facility] as well as to our Urogynecologist here, Dr. [name].  At this time, the patient and her daughter are leaning towards attempt of labial separation and reestablishing normal anatomy.  They understand that this would likely mean an indwelling urinary catheter for a period of time while things heal.  We also discussed treatment of her high-grade dysplasia.  The patient has been somewhat hesitant to make a decision about her labial fusion and the daughter is worried about deferring treatment of her high-grade dysplasia.  While we could certainly perform laser, I also offered that we could treat with topical therapy using imiquimod until we make a decision about how to proceed surgically.  They were interested in this treatment option.  They were given a patient handout for using imiquimod.  I would like her to use it 3 times a week but recommended that they start with once or twice weekly application to see how she tolerates it.  I have asked her to apply this to the area on the right side of her vulva where she has pain.  28 minutes of total time was spent for this patient encounter, including preparation, face-to-face counseling with the patient and coordination of care, and documentation of the encounter.  Jeral Pinch, MD  Division of Gynecologic Oncology  Department of Obstetrics and Gynecology  [facility]

## 2021-11-07 ENCOUNTER — Ambulatory Visit (INDEPENDENT_AMBULATORY_CARE_PROVIDER_SITE_OTHER): Payer: 59 | Admitting: Obstetrics and Gynecology

## 2021-11-07 ENCOUNTER — Encounter: Payer: Self-pay | Admitting: Obstetrics and Gynecology

## 2021-11-07 VITALS — BP 138/84

## 2021-11-07 DIAGNOSIS — R102 Pelvic and perineal pain: Secondary | ICD-10-CM | POA: Diagnosis not present

## 2021-11-07 DIAGNOSIS — M62838 Other muscle spasm: Secondary | ICD-10-CM | POA: Diagnosis not present

## 2021-11-07 DIAGNOSIS — N3281 Overactive bladder: Secondary | ICD-10-CM | POA: Diagnosis not present

## 2021-11-07 MED ORDER — CYCLOBENZAPRINE HCL 5 MG PO TABS: 5.0000 mg | ORAL_TABLET | Freq: Three times a day (TID) | ORAL | 5 refills | Status: DC | PRN

## 2021-11-07 NOTE — Progress Notes (Signed)
[facility] New Patient Evaluation and Consultation  Referring Provider: Lafonda Mosses, MD PCP: Fredirick Lathe, PA-C Date of Service: 11/07/2021  SUBJECTIVE Chief Complaint: Vaginal Pain  History of Present Illness: Ellen Ryan is a 66 y.o.  African  female seen in consultation at the request of Dr. Berline Lopes for evaluation of vaginal pain.    Review of records from Epic significant for: Seen by [name] 2018 for vaginal ulceration. This was biopsied in the OR and returned as extramammary pagets disease.     On 09/21/16, she underwent a complete simple vulvectomy.  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 No invasive carcinoma, negative margins. Complete obliteration/ agglutination of the vagina was noted.     08/2017 Intraoperative findings were significant for subtle acetowhite changes immediately lateral on the right side of the urethral meatus.  There also acetowhite changes to the midline distal vagina.  Vagina was completely agglutinated.  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  The posterior vaginal introitus biopsy revealed benign normal squamous mucosa.  On 10/19/17 she underwent CO2 laser to the urethral meatus.  07/2021 with sensation of "skin crawling". Had self treated for possible pinworms. Due to pain limiting exam, EUA and biopsies performed on 08/31/21. Bilateral biopsies were taken showing VIN3 with associated erosion and inflammation. No Paget's disease identified in these biopsies.   Urinary Symptoms: Leaks urine with with movement to the bathroom and with urgency Incontinence has mostly stopped now, rare. Used to be a bigger issue.  Pad use: 1 pads per day.   She is bothered by her UI symptoms. Has tried oxybutynin and trospium in the past and it did not help. Myrbetriq was too expensive.   Day time voids 8.  Nocturia: 3 times per night to void. Voiding dysfunction: she does not empty her bladder well.   does not use a catheter to empty bladder.  When urinating, she feels a weak stream and the need to urinate multiple times in a row. Has episodes when she has trouble urinating, other times  UTIs: 1 UTI's in the last year.   Currently has a suprapubic tube in place   Pelvic Organ Prolapse Symptoms:                  Patient has complete vaginal obliteration   Bowel Symptom: Bowel movements: 1 time(s) per day Stool consistency: hard Straining: yes.  Splinting: yes.  Incomplete evacuation: no.  She Denies accidental bowel leakage / fecal incontinence Bowel regimen: stool softener and miralax  Sexual Function Sexually active: no.   Pelvic Pain She feels something as large as a probe in the vaginal area, feels it moving around. Can happen anytime. Pain started worsening this year.  Pain also comes on the right side buttock then "crawls" down to vulva/ vagina area. Feels "heavy". Has trouble sitting on right side.  She is using lidocaine cream.   Has never seen pelvic PT.   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     Past Surgical History:   Past Surgical History:  Procedure Laterality Date   CO2 LASER APPLICATION N/A 6/56/8127   Procedure: CO2 LASER APPLICATION OF THE VULVA;  Surgeon: Everitt Amber, MD;   Location: [facility];  Service: Gynecology;  Laterality: N/A;   CO2 LASER APPLICATION N/A 09/08/15   Procedure: VAGINOSCOPY, VULVAR BIOPSIES;  Surgeon: Lafonda Mosses, MD;  Location[facility];  Service: Gynecology;  Laterality: N/A;   EUA/ VULVA BIOPSY  09-01-2016  [name]  [place] N/A 09/21/2016   Procedure: RADICAL VULVECTOMY COMPLETE;  Surgeon: Everitt Amber, MD;  Location: [facility];  Service: Gynecology;  Laterality: N/A;   TRANSTHORACIC ECHOCARDIOGRAM  01/16/2017   ef 60-65%,  grade 1 diastolic dysfunction/  very mild AV stenosis with moderately thickened and calcified leaflets (valve area 1.85cm^2)/  moderate LAE   VULVA /PERINEUM BIOPSY N/A 09/13/2017   Procedure: VULVAR BIOPSY;  Surgeon: Everitt Amber, MD;  Location: [facility];  Service: Gynecology;  Laterality: N/A;   VULVA Milagros Loll BIOPSY N/A 08/31/2021   Procedure: VULVAR BIOPSY;  Surgeon: Lahoma Crocker, MD;  Location: [facility];  Service: Gynecology;  Laterality: N/A;     Past OB/GYN History: OB History  Gravida Para Term Preterm AB Living  6         6  SAB IAB Ectopic Multiple Live Births          6    # Outcome Date GA Lbr Len/2nd Weight Sex Delivery Anes PTL Lv  6 Gravida           5 Gravida           4 Gravida           3 Gravida           2 Gravida           1 Gravida             Vaginal deliveries: 6 Pelvic US (09/12/21): Measurements: 10.4 x 3.8 x 5.9 cm = volume: 123 mL. There are multiple coarse calcifications in the uterus, the known fibroids are difficult to accurately define on this transabdominal exam. Left-sided body fibroid measures approximately 2.8 cm on my measurement, although the margins are difficult to accurately delineate.    Medications: She has a current medication list which includes the following prescription(s): acetaminophen, amlodipine, blood glucose meter kit and supplies, true metrix meter,  gabapentin, freestyle lite, ibuprofen, freestyle, melatonin, metformin, multivitamin with minerals, fish oil, polyethylene glycol, senna, cyclobenzaprine, and imiquimod.   Allergies: Patient is allergic to oxycodone.   Social History:  Social History   Tobacco Use   Smoking status: Never   Smokeless tobacco: Never  Vaping Use   Vaping Use: Never used  Substance Use Topics   Alcohol use: Not Currently   Drug use: No    Relationship status: widowed She lives with daughter and family.   She is not employed. Regular exercise: No History of abuse: No  Family History:   Family History  Problem Relation Age of Onset   Stroke  Mother    Arthritis Mother    Diabetes Father    Cancer Daughter      Review of Systems: Review of Systems  Constitutional:  Negative for fever, malaise/fatigue and weight loss.  Respiratory:  Negative for cough, shortness of breath and wheezing.   Cardiovascular:  Negative for chest pain, palpitations and leg swelling.  Gastrointestinal:  Negative for abdominal pain and blood in stool.  Genitourinary:  Positive for dysuria.  Musculoskeletal:  Negative for myalgias.  Skin:  Negative for rash.  Neurological:  Negative for dizziness and headaches.  Endo/Heme/Allergies:  Does not bruise/bleed easily.  Psychiatric/Behavioral:  Negative for depression. The patient is not nervous/anxious.      OBJECTIVE Physical Exam: Vitals:   11/07/21 1345  BP: 138/84    Physical Exam Constitutional:      General: She is not in acute distress. Pulmonary:     Effort: Pulmonary effort is normal.  Abdominal:     General: There is no distension.     Palpations: Abdomen is soft.     Tenderness: There is no abdominal tenderness. There is no rebound.     Comments: Small umbilical hernia present  Genitourinary:      Comments: Most pain at asterisk Musculoskeletal:        General: No swelling. Normal range of motion.  Skin:    General: Skin is warm and dry.      Findings: No rash.  Neurological:     Mental Status: She is alert and oriented to person, place, and time.  Psychiatric:        Mood and Affect: Mood normal.        Behavior: Behavior normal.      GU / Detailed Urogynecologic Evaluation:  Pelvic Exam: Fusion of labia majora. Due to discomfort and limited exam, vaginal fourchette and urethral meatus not visible.   Q tip test- positive for pain in distribution on above diagram.   Tenderness on palpation of right buttock   Rectal Exam:  deferred  Post-Void Residual (PVR) by Bladder Scan: In order to evaluate bladder emptying, we discussed obtaining a postvoid residual and she agreed to this procedure.  Procedure: The ultrasound unit was placed on the patient's abdomen in the suprapubic region after the patient had voided. A PVR of 54 ml was obtained by bladder scan.   ASSESSMENT AND PLAN Ms. Grindstaff is a 66 y.o. with:  1. Pelvic pain   2. Levator spasm   3. Overactive bladder    Pelvic pain/ levator spasm - Per previous discussion, there was suspicion that the "crawling" sensation could be to trapped urine and there may be benefit for labiaplasty. However, based on discussion and exam, patient elicits symptoms of pelvic floor muscle pain/ levator spasm with possible pudendal nerve involvement. I do not feel surgery would best treatment.  - Treatment options include pelvic floor physical therapy, muscle relaxants, pelvic muscle trigger point injections or centrally acting pain medications.  Referral placed to pelvic physical therapy. Prescribed flexeril 70m TID prn.  - May be a component of pudendal nerve involvement on the right. Already taking gabapentin 6024mtwice a day. May benefit from pudendal nerve block, but unsure if she would tolerate this in the office.   2. OAB - not as bothersome an issue as her pain currently.  - Will readdress at future visit  Follow up in 6 weeks  MiJaquita FoldsMD

## 2021-11-08 ENCOUNTER — Ambulatory Visit: Payer: 59 | Attending: Obstetrics and Gynecology | Admitting: Physical Therapy

## 2021-11-08 ENCOUNTER — Other Ambulatory Visit: Payer: Self-pay

## 2021-11-08 DIAGNOSIS — R102 Pelvic and perineal pain: Secondary | ICD-10-CM | POA: Insufficient documentation

## 2021-11-08 DIAGNOSIS — M6281 Muscle weakness (generalized): Secondary | ICD-10-CM | POA: Diagnosis not present

## 2021-11-08 DIAGNOSIS — M62838 Other muscle spasm: Secondary | ICD-10-CM | POA: Insufficient documentation

## 2021-11-08 DIAGNOSIS — R293 Abnormal posture: Secondary | ICD-10-CM | POA: Diagnosis not present

## 2021-11-08 DIAGNOSIS — R279 Unspecified lack of coordination: Secondary | ICD-10-CM | POA: Diagnosis not present

## 2021-11-08 NOTE — Therapy (Signed)
OUTPATIENT PHYSICAL THERAPY FEMALE PELVIC EVALUATION   Patient Name: Ngozi Alvidrez MRN: [id number] DOB:1955-06-09, 66 y.o., female Today's Date: 11/08/2021   PT End of Session - 11/08/21 1607     Visit Number 1    Date for PT Re-Evaluation 02/08/22    Authorization Type [employer]    PT Start Time 1530    PT Stop Time 1615    PT Time Calculation (min) 45 min    Activity Tolerance Patient tolerated treatment well;Patient limited by pain    Behavior During Therapy [facility] for tasks assessed/performed             Past Medical History:  Diagnosis Date   Aortic valve stenosis    per echo 01-16-2017  very mild AV stenosis w/ valve area 1.85cm^2   Arthritis    both knees   Disc degeneration, lumbar    L4-L5   Grade I diastolic dysfunction 10/18/9483   Noted on ECHO   History of dysplasia of vulva    09-21-2017  VIN3   Hyperlipidemia    Hypertension    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-- s/p complete vulvectomy 09-21-2016   Type 2 diabetes mellitus ([place]) 12/27/2016   Uterine fibroid    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   Past Surgical History:  Procedure Laterality Date   CO2 LASER APPLICATION N/A 4/62/7035   Procedure: CO2 LASER APPLICATION OF THE VULVA;  Surgeon: Everitt Amber, MD;  Location: [facility];  Service: Gynecology;  Laterality: N/A;   CO2 LASER APPLICATION N/A 0/12/3816   Procedure: VAGINOSCOPY, VULVAR BIOPSIES;  Surgeon: Lafonda Mosses, MD;  Location: [facility];  Service: Gynecology;  Laterality: N/A;   EUA/ VULVA BIOPSY  09-01-2016  [name]  [place] N/A 09/21/2016   Procedure: RADICAL VULVECTOMY COMPLETE;  Surgeon: Everitt Amber, MD;  Location: [facility];  Service: Gynecology;  Laterality: N/A;   TRANSTHORACIC ECHOCARDIOGRAM  01/16/2017   ef 60-65%,  grade 1 diastolic dysfunction/  very mild AV stenosis with moderately thickened and calcified leaflets (valve area  1.85cm^2)/  moderate LAE   VULVA /PERINEUM BIOPSY N/A 09/13/2017   Procedure: VULVAR BIOPSY;  Surgeon: Everitt Amber, MD;  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[place]) 09/15/2016   Vaginal pain 05/17/2016   Essential hypertension 05/17/2016   Vaginal ulceration 04/06/2016    PCP: Jaquita Folds, MD  REFERRING PROVIDER: Jaquita Folds, MD  REFERRING DIAG: R10.2 (ICD-10-CM) - Pelvic pain  THERAPY DIAG:  Muscle weakness (generalized) - Plan: PT plan of care cert/re-cert  Abnormal posture - Plan: PT plan of care cert/re-cert  Other muscle spasm - Plan: PT plan of care cert/re-cert  Unspecified lack of coordination - Plan: PT plan of care cert/re-cert  Rationale for Evaluation and Treatment Rehabilitation  ONSET DATE: 1 year  SUBJECTIVE:  SUBJECTIVE STATEMENT: Pt reports high levels of pain limiting all mobility for one year all at vulva area.     PAIN:  Are you having pain? Yes NPRS scale: 10/10 Pain location: Vaginal  Pain type: burning and tight Pain description: constant   Aggravating factors: sitting, walking, bending, standing prolonged, lying, washing  Relieving factors: medications   PRECAUTIONS: None  WEIGHT BEARING  RESTRICTIONS No  FALLS:  Has patient fallen in last 6 months? No  LIVING ENVIRONMENT: Lives with: lives with their family Lives in: House/apartment   OCCUPATION: retired  PLOF: Independent  PATIENT GOALS to have less pain   PERTINENT HISTORY:  2018 for vaginal ulceration. This was biopsied in the OR and returned as extramammary pagets disease.   09/21/16, she underwent a complete simple vulvectomy.  08/2017 Intraoperative findings were significant for subtle acetowhite changes immediately lateral on the right side of the urethral meatus.  There also acetowhite changes to the midline distal vagina.  Vagina was completely agglutinated. 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  The posterior vaginal introitus biopsy revealed benign normal squamous mucosa. 07/2021 with sensation of "skin crawling". Had self treated for possible pinworms. Due to pain limiting exam, EUA and biopsies performed on 08/31/21. Bilateral biopsies were taken showing VIN3 with associated erosion and inflammation. No Paget's disease identified in these biopsies.  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, but only had removal of the clitoris and denies closure of labia Sexual abuse: No  BOWEL MOVEMENT Pain with bowel movement: Yes Type of bowel movement:Type (Bristol Stool Scale) 3-4, Frequency daily, and Strain Yes Fully empty rectum: Yes:   Leakage: No Pads: No Fiber supplement: No  URINATION Pain with urination: Yes Fully empty bladder: No Stream: Strong Urgency: Yes:   Frequency: 1-2 hours Leakage:  none Pads: No  INTERCOURSE Pain with intercourse:  not active Ability to have vaginal penetration:  No   PREGNANCY Vaginal deliveries 6 Tearing Yes: with 2 C-section deliveries 0 Currently pregnant No  PROLAPSE Heaviness/bulge sometimes worst in shower    OBJECTIVE:   DIAGNOSTIC FINDINGS:    COGNITION:  Overall cognitive status: Within functional limits for tasks  assessed     SENSATION:  Light touch: Deficits tingling in Lt fingers  Proprioception: Appears intact  MUSCLE LENGTH: Bil hamstrings and adductors limited by 75% but also has pain with mobility    GAIT: Distance walked: 15' Assistive device utilized: Single point cane Level of assistance: SBA Comments: decreased bil step height, poor cadence, forward head carriage, decreased bil arm swing, decreased stride length               POSTURE: rounded shoulders, forward head, and posterior pelvic tilt   LUMBARAROM/PROM  A/PROM A/PROM  eval  Flexion Limited by 75%  Extension Limited by 50%  Right lateral flexion Limited by 50%  Left lateral flexion Limited by 50%  Right rotation Limited by 50%  Left rotation Limited by 50%   (Blank rows = not tested)  LOWER EXTREMITY ROM:  Limited due to pain at bil LE by 25% grossly.   LOWER EXTREMITY MMT:  Bil hips 3/5 grossly; bil knees and ankles 4/5   PALPATION:   General  TTP at bil proximal adductors, bil lower abdominal quadrants with fascial restrictions, TTP at pubic bone                External Perineal Exam pt deferred today (eval)  Internal Pelvic Floor pt deferred today (eval)  Patient confirms identification and approves PT to assess internal pelvic floor and treatment Yes but not today, plans for next session, grandson present during eval.   PELVIC MMT:   MMT eval  Vaginal   Internal Anal Sphincter   External Anal Sphincter   Puborectalis   Diastasis Recti   (Blank rows = not tested)        TONE: deferred  PROLAPSE: deferred  TODAY'S TREATMENT  EVAL Examination completed, findings reviewed, pt educated on POC, HEP, and pelvic relaxation techniques. Pt motivated to participate in PT and agreeable to attempt recommendations.     PATIENT EDUCATION:  Education details: [place] Person educated: Patient and Child(ren) Education method: Explanation, Demonstration, Tactile cues, Verbal  cues, and Handouts Education comprehension: verbalized understanding and returned demonstration   HOME EXERCISE PROGRAM: Access Code: [email or web address] URL: [email or web address] Date: 11/08/2021 Prepared by: [facility]  Exercises - Supine Diaphragmatic Breathing  - 1 x daily - 7 x weekly - 1 sets - 10 reps - Unilateral Supported Supine Butterfly Stretch  - 1 x daily - 7 x weekly - 1 sets - 3 reps - 30s holds - Supine Lower Trunk Rotation  - 1 x daily - 7 x weekly - 1 sets - 3 reps - 30s holds  ASSESSMENT:  CLINICAL IMPRESSION: Patient is a 66 y.o. female  who was seen today for physical therapy evaluation and treatment for chronic pelvic pain. Pt presents with complex medical history of multiple biopsies at vulva, simple vulvectomy 09/21/16, 2018 for vaginal ulceration, findings of completed agglutinated vagina with acetowhite changes at lateral Rt side of urethral meatus and distal vagina, Biopsy from the periurethral tissue on the right revealed VIN 3 in 08/2017, 08/31/21 pt had biopsy again - Bilateral biopsies were taken showing VIN3 with associated erosion and inflammation. No Paget's disease identified in these biopsies. 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 Pt has had 6 vaginal births. Pt reports pain limits all mobility and usually 8-10/10. Pt found to have limitations in flexibility by at least 50-75% in all directions at spine and decreased flexibility in bil hips, decreased strength at bil hips and core, fascial restrictions in abdomen, TTP in abdomen and at pelvic region at pubic bone and proximal adductors. Pt deferred internal today, her grandson is present and requested to do this next session or at least external pelvic floor evaluation then. Pt's DTR present to interpret as needed, pt denied formal interpreter. Pt would benefit from additional PT  to further address deficits.     OBJECTIVE IMPAIRMENTS decreased activity tolerance, decreased coordination, decreased endurance, decreased mobility, difficulty walking, decreased strength, increased fascial restrictions, increased muscle spasms, impaired flexibility, improper body mechanics, postural dysfunction, and pain.   ACTIVITY LIMITATIONS carrying, lifting, bending, sitting, standing, squatting, sleeping, stairs, transfers, bed mobility, bathing, toileting, and locomotion level  PARTICIPATION LIMITATIONS: cleaning, laundry, interpersonal relationship, shopping, community activity, and yard work  PERSONAL FACTORS Age, Past/current experiences, Time since onset of injury/illness/exacerbation, and 3+ comorbidities: complex medical history  are also affecting patient's functional outcome.   REHAB POTENTIAL: Good  CLINICAL DECISION MAKING: Evolving/moderate complexity  EVALUATION COMPLEXITY: Moderate   GOALS: Goals reviewed with patient? Yes  SHORT TERM GOALS: Target date: 12/06/2021  Pt to be I with HEP.  Baseline: Goal status: INITIAL  2.  Pt will  report 25% reduction of pain due to improvements in posture, strength, and muscle length  Baseline:  Goal status: INITIAL  3.  Pt to report improved ability to fully void bowels and bladder at least 50% of the time due to improved mechanics and evacuation techniques.  Baseline:  Goal status: INITIAL    LONG TERM GOALS: Target date:  02/08/22    Pt to be I with advanced HEP.  Baseline:  Goal status: INITIAL  2.   Pt will report 50% reduction of pain due to improvements in posture, strength, and muscle length  Baseline:  Goal status: INITIAL  3.  Pt to report ability to sit without pain higher than 5/10 for at least 30 mins for improved QOL.  Baseline:  Goal status: INITIAL  4.  Pt demonstrate improved coordination of breathing and pelvic floor for improved pelvic relaxation for decreased pain for improved QOL at least  50% of the time.  Baseline:  Goal status: INITIAL    PLAN: PT FREQUENCY: 1x/week  PT DURATION:  8 sessions  PLANNED INTERVENTIONS: Therapeutic exercises, Therapeutic activity, Neuromuscular re-education, Patient/Family education, Self Care, Joint mobilization, Aquatic Therapy, Dry Needling, Spinal mobilization, Cryotherapy, Moist heat, Manual lymph drainage, scar mobilization, Taping, Biofeedback, and Manual therapy  PLAN FOR NEXT SESSION: external pelvic floor assessment, possible internal if tolerated and consenting, abdominal/hip/back manual and stretching    Stacy Gardner, PT, DPT 07/18/234:32 PM

## 2021-11-14 ENCOUNTER — Ambulatory Visit: Payer: 59 | Admitting: Physical Therapy

## 2021-11-14 DIAGNOSIS — R293 Abnormal posture: Secondary | ICD-10-CM

## 2021-11-14 DIAGNOSIS — R102 Pelvic and perineal pain: Secondary | ICD-10-CM | POA: Diagnosis not present

## 2021-11-14 DIAGNOSIS — M62838 Other muscle spasm: Secondary | ICD-10-CM

## 2021-11-14 DIAGNOSIS — R269 Unspecified abnormalities of gait and mobility: Secondary | ICD-10-CM

## 2021-11-14 DIAGNOSIS — M6281 Muscle weakness (generalized): Secondary | ICD-10-CM

## 2021-11-14 NOTE — Therapy (Signed)
OUTPATIENT PHYSICAL THERAPY FEMALE PELVIC EVALUATION   Patient Name: Anie Juniel MRN: [id number] DOB:1956/01/29, 66 y.o., female Today's Date: 11/14/2021   PT End of Session - 11/14/21 1447     Visit Number 2    Date for PT Re-Evaluation 02/08/22    Authorization Type [employer]    PT Start Time 1400    PT Stop Time 0626    PT Time Calculation (min) 45 min    Activity Tolerance Patient tolerated treatment well;Patient limited by pain    Behavior During Therapy [facility] for tasks assessed/performed              Past Medical History:  Diagnosis Date   Aortic valve stenosis    per echo 01-16-2017  very mild AV stenosis w/ valve area 1.85cm^2   Arthritis    both knees   Disc degeneration, lumbar    L4-L5   Grade I diastolic dysfunction 94/85/4627   Noted on ECHO   History of dysplasia of vulva    09-21-2017  VIN3   Hyperlipidemia    Hypertension    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[place])    extramammary-- s/p complete vulvectomy 09-21-2016   Type 2 diabetes mellitus ([place]) 12/27/2016   Uterine fibroid    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   Past Surgical History:  Procedure Laterality Date   CO2 LASER APPLICATION N/A 0/35/0093   Procedure: CO2 LASER APPLICATION OF THE VULVA;  Surgeon: Everitt Amber, MD;  Location: [facility];  Service: Gynecology;  Laterality: N/A;   CO2 LASER APPLICATION N/A 12/10/2991   Procedure: VAGINOSCOPY, VULVAR BIOPSIES;  Surgeon: Lafonda Mosses, MD;  Location: [facility];  Service: Gynecology;  Laterality: N/A;   EUA/ VULVA BIOPSY  09-01-2016  [name]  [place] N/A 09/21/2016   Procedure: RADICAL VULVECTOMY COMPLETE;  Surgeon: Everitt Amber, MD;  Location: [facility];  Service: Gynecology;  Laterality: N/A;   TRANSTHORACIC ECHOCARDIOGRAM  01/16/2017   ef 60-65%,  grade 1 diastolic dysfunction/  very mild AV stenosis with moderately thickened and calcified leaflets (valve area  1.85cm^2)/  moderate LAE   VULVA /PERINEUM BIOPSY N/A 09/13/2017   Procedure: VULVAR BIOPSY;  Surgeon: Everitt Amber, MD;  Location: [facility];  Service: Gynecology;  Laterality: N/A;   VULVA Milagros Loll BIOPSY N/A 08/31/2021   Procedure: VULVAR BIOPSY;  Surgeon: Lahoma Crocker, MD;  Location: [facility];  Service: Gynecology;  Laterality: N/A;   Patient Active Problem List   Diagnosis Date Noted   Labial fusion 10/31/2021   Urinary frequency 12/27/2016   Dysuria 12/27/2016   Diabetes mellitus screening 12/27/2016   Cough 12/27/2016   Diabetes mellitus, new onset ([place]) 12/27/2016   Vulvar intraepithelial neoplasia (VIN) grade 3 10/04/2016   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[place]) 09/15/2016   Vaginal pain 05/17/2016   Essential hypertension 05/17/2016   Vaginal ulceration 04/06/2016    PCP: Jaquita Folds, MD  REFERRING PROVIDER: Jaquita Folds, MD  REFERRING DIAG: R10.2 (ICD-10-CM) - Pelvic pain  THERAPY DIAG:  Other muscle spasm  Abnormal posture  Abnormality of gait and mobility  Muscle weakness (generalized)  Rationale for Evaluation and Treatment Rehabilitation  ONSET DATE: 1 year  SUBJECTIVE:  SUBJECTIVE STATEMENT: Pt states she feels like a worm is crawling in her pelvis, she states she can physically feel the worm with her hand and which she touches skin she can feel it move. Pt reports this is her source of pain and when she pushes too hard at and area where she feels it she gets a sharp pain and feels like something is biting her. Pt states she has bought deworming medication herself and had no help with this, CT and ultrasound (-). Pt also states she has tried heat for sitting at home and this has helped but when she using it she feels like the  worm travels upward away from heat.    PAIN:  Are you having pain? Yes NPRS scale: 10/10 Pain location: Vaginal  Pain type: burning and tight Pain description: constant   Aggravating factors: sitting, walking, bending, standing prolonged, lying, washing  Relieving factors: medications   PRECAUTIONS: None  OCCUPATION: retired  PLOF: Independent  PATIENT GOALS to have less pain   PERTINENT HISTORY:  2018 for vaginal ulceration. This was biopsied in the OR and returned as extramammary pagets disease.   09/21/16, she underwent a complete simple vulvectomy.  08/2017 Intraoperative findings were significant for subtle acetowhite changes immediately lateral on the right side of the urethral meatus.  There also acetowhite changes to the midline distal vagina.  Vagina was completely agglutinated. 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  The posterior vaginal introitus biopsy revealed benign normal squamous mucosa. 07/2021 with sensation of "skin crawling". Had self treated for possible pinworms. Due to pain limiting exam, EUA and biopsies performed on 08/31/21. Bilateral biopsies were taken showing VIN3 with associated erosion and inflammation. No Paget's disease identified in these biopsies.  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, but only had removal of the clitoris and denies closure of labia Sexual abuse: No  BOWEL MOVEMENT Pain with bowel movement: Yes Type of bowel movement:Type (Bristol Stool Scale) 3-4, Frequency daily, and Strain Yes Fully empty rectum: Yes:   Leakage: No Pads: No Fiber supplement: No  URINATION Pain with urination: Yes Fully empty bladder: No Stream: Strong Urgency: Yes:   Frequency: 1-2 hours Leakage:  none Pads: No  INTERCOURSE Pain with intercourse:  not active Ability to have vaginal penetration:  No   PREGNANCY Vaginal deliveries 6 Tearing Yes: with 2 C-section deliveries 0 Currently pregnant  No  PROLAPSE Heaviness/bulge sometimes worst in shower    OBJECTIVE:   DIAGNOSTIC FINDINGS:    COGNITION:  Overall cognitive status: Within functional limits for tasks assessed     SENSATION:  Light touch: Deficits tingling in Lt fingers  Proprioception: Appears intact  MUSCLE LENGTH: Bil hamstrings and adductors limited by 75% but also has pain with mobility    GAIT: Distance walked: 30' Assistive device utilized: Single point cane Level of assistance: SBA Comments: decreased bil step height, poor cadence, forward head carriage, decreased bil arm swing, decreased stride length               POSTURE: rounded shoulders, forward head, and posterior pelvic tilt   LUMBARAROM/PROM  A/PROM A/PROM  eval  Flexion Limited by 75%  Extension Limited by 50%  Right lateral flexion Limited by 50%  Left lateral flexion Limited by 50%  Right rotation Limited by 50%  Left rotation Limited by 50%   (Blank rows = not tested)  LOWER EXTREMITY ROM:  Limited due to pain at bil LE by 25%  grossly.   LOWER EXTREMITY MMT:  Bil hips 3/5 grossly; bil knees and ankles 4/5   PALPATION:   General  TTP at bil proximal adductors, bil lower abdominal quadrants with fascial restrictions, TTP at pubic bone                External Perineal Exam pt deferred today (eval)                             Internal Pelvic Floor pt deferred today (eval)  Patient confirms identification and approves PT to assess internal pelvic floor and treatment Yes but not today, plans for next session, grandson present during eval.   Pt unable to have internal exam due to anatomy of vaginal and labia. This is fused.   PELVIC MMT:   MMT eval  Vaginal   Internal Anal Sphincter   External Anal Sphincter   Puborectalis   Diastasis Recti   (Blank rows = not tested)        TONE: deferred  PROLAPSE: deferred  TODAY'S TREATMENT  11/14/21  Manual work at lower abdomen with gentle fascial release with  indirect method, soft tissue mobility at proximal adductors and hip flexors. Pt consented to mobilization of tissue at external pelvic floor, pt does have fusion of bil labia majora therefore unable to have internal exam and has tension throughout external assessment of pelvic floor. Pt reports with palpation of tissue at Rt groin she could feel the crawling/worm-like sensation moving. Pt very uncomfortable by this and reports this is the main course of her pain.  3x30s single leg butterfly stretch 3x30s single leg knee to chest with PT assisting due to pain's limited mobility and stretching only within pt's tolerance of range.   EVAL Examination completed, findings reviewed, pt educated on POC, HEP, and pelvic relaxation techniques. Pt motivated to participate in PT and agreeable to attempt recommendations.     PATIENT EDUCATION:  Education details: [place] Person educated: Patient and Child(ren) Education method: Explanation, Demonstration, Tactile cues, Verbal cues, and Handouts Education comprehension: verbalized understanding and returned demonstration   HOME EXERCISE PROGRAM: Access Code: [email or web address] URL: [email or web address] Date: 11/08/2021 Prepared by: [facility]  Exercises - Supine Diaphragmatic Breathing  - 1 x daily - 7 x weekly - 1 sets - 10 reps - Unilateral Supported Supine Butterfly Stretch  - 1 x daily - 7 x weekly - 1 sets - 3 reps - 30s holds - Supine Lower Trunk Rotation  - 1 x daily - 7 x weekly - 1 sets - 3 reps - 30s holds  ASSESSMENT:  CLINICAL IMPRESSION: Patient presents with same symptoms, still limited with all mobility, sitting and using restroom sometimes. Pt reports she thinks a worm is causing her pain at pelvis and she can feel it move. Pt reports she also sometimes feels like worm can move to an area and blocks her ability to empty bladder. Pt and daughter report this started in February after pt  returned from [place] but has tried over the counter medication for worms x2 and had no relief. Pt reports this is her pain and both daughter and pt report they understand pt has tightness at hips,back, and around pelvic area but this has been present since 2018 and pain hasn't been this way. Pt session focused on manual very gentle mobility at pubic bone and bil labia majora laterally though fused. Pt tolerated fair but  did report pain with palpation throughout and unable to tolerate anything more than gentle mobility here. Manual focus of session. Pt's daughter asked about additional imagine PT educated to ask MD about this and discuss any medical recommendations. Pt would benefit from additional PT to further address deficits.     OBJECTIVE IMPAIRMENTS decreased activity tolerance, decreased coordination, decreased endurance, decreased mobility, difficulty walking, decreased strength, increased fascial restrictions, increased muscle spasms, impaired flexibility, improper body mechanics, postural dysfunction, and pain.   ACTIVITY LIMITATIONS carrying, lifting, bending, sitting, standing, squatting, sleeping, stairs, transfers, bed mobility, bathing, toileting, and locomotion level  PARTICIPATION LIMITATIONS: cleaning, laundry, interpersonal relationship, shopping, community activity, and yard work  PERSONAL FACTORS Age, Past/current experiences, Time since onset of injury/illness/exacerbation, and 3+ comorbidities: complex medical history  are also affecting patient's functional outcome.   REHAB POTENTIAL: Good  CLINICAL DECISION MAKING: Evolving/moderate complexity  EVALUATION COMPLEXITY: Moderate   GOALS: Goals reviewed with patient? Yes  SHORT TERM GOALS: Target date: 12/06/2021  Pt to be I with HEP.  Baseline: Goal status: INITIAL  2.  Pt will report 25% reduction of pain due to improvements in posture, strength, and muscle length  Baseline:  Goal status: INITIAL  3.  Pt to report  improved ability to fully void bowels and bladder at least 50% of the time due to improved mechanics and evacuation techniques.  Baseline:  Goal status: INITIAL    LONG TERM GOALS: Target date:  02/08/22    Pt to be I with advanced HEP.  Baseline:  Goal status: INITIAL  2.   Pt will report 50% reduction of pain due to improvements in posture, strength, and muscle length  Baseline:  Goal status: INITIAL  3.  Pt to report ability to sit without pain higher than 5/10 for at least 30 mins for improved QOL.  Baseline:  Goal status: INITIAL  4.  Pt demonstrate improved coordination of breathing and pelvic floor for improved pelvic relaxation for decreased pain for improved QOL at least 50% of the time.  Baseline:  Goal status: INITIAL    PLAN: PT FREQUENCY: 1x/week  PT DURATION:  8 sessions  PLANNED INTERVENTIONS: Therapeutic exercises, Therapeutic activity, Neuromuscular re-education, Patient/Family education, Self Care, Joint mobilization, Aquatic Therapy, Dry Needling, Spinal mobilization, Cryotherapy, Moist heat, Manual lymph drainage, scar mobilization, Taping, Biofeedback, and Manual therapy  PLAN FOR NEXT SESSION: external pelvic floor mobility,  abdominal/hip/back manual and stretching    Stacy Gardner, PT, DPT 07/24/233:02 PM

## 2021-11-22 ENCOUNTER — Ambulatory Visit: Payer: Commercial Managed Care - HMO | Attending: Obstetrics and Gynecology | Admitting: Physical Therapy

## 2021-11-22 DIAGNOSIS — M6281 Muscle weakness (generalized): Secondary | ICD-10-CM | POA: Insufficient documentation

## 2021-11-22 DIAGNOSIS — R279 Unspecified lack of coordination: Secondary | ICD-10-CM | POA: Insufficient documentation

## 2021-11-22 DIAGNOSIS — M62838 Other muscle spasm: Secondary | ICD-10-CM | POA: Insufficient documentation

## 2021-11-22 DIAGNOSIS — R293 Abnormal posture: Secondary | ICD-10-CM | POA: Diagnosis present

## 2021-11-22 DIAGNOSIS — R269 Unspecified abnormalities of gait and mobility: Secondary | ICD-10-CM | POA: Insufficient documentation

## 2021-11-22 NOTE — Therapy (Signed)
OUTPATIENT PHYSICAL THERAPY FEMALE PELVIC EVALUATION   Patient Name: Ellen Ryan MRN: [id number] DOB:04-24-1956, 66 y.o., female Today's Date: 11/22/2021   PT End of Session - 11/22/21 1313     Visit Number 3    Date for PT Re-Evaluation 02/08/22    Authorization Type [employer]    PT Start Time 1238    PT Stop Time 0175    PT Time Calculation (min) 35 min    Activity Tolerance Patient tolerated treatment well;Patient limited by pain    Behavior During Therapy [facility] for tasks assessed/performed              Past Medical History:  Diagnosis Date   Aortic valve stenosis    per echo 01-16-2017  very mild AV stenosis w/ valve area 1.85cm^2   Arthritis    both knees   Disc degeneration, lumbar    L4-L5   Grade I diastolic dysfunction 02/15/8526   Noted on ECHO   History of dysplasia of vulva    09-21-2017  VIN3   Hyperlipidemia    Hypertension    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-- s/p complete vulvectomy 09-21-2016   Type 2 diabetes mellitus ([place]) 12/27/2016   Uterine fibroid    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   Past Surgical History:  Procedure Laterality Date   CO2 LASER APPLICATION N/A 7/82/4235   Procedure: CO2 LASER APPLICATION OF THE VULVA;  Surgeon: Everitt Amber, MD;  Location: [place];  Service: Gynecology;  Laterality: N/A;   CO2 LASER APPLICATION N/A 3/61/4431   Procedure: VAGINOSCOPY, VULVAR BIOPSIES;  Surgeon: Lafonda Mosses, MD;  Location: [facility];  Service: Gynecology;  Laterality: N/A;   EUA/ VULVA BIOPSY  09-01-2016  [name]  [place] N/A 09/21/2016   Procedure: RADICAL VULVECTOMY COMPLETE;  Surgeon: Everitt Amber, MD;  Location: [facility];  Service: Gynecology;  Laterality: N/A;   TRANSTHORACIC ECHOCARDIOGRAM  01/16/2017   ef 60-65%,  grade 1 diastolic dysfunction/  very mild AV stenosis with moderately thickened and calcified leaflets (valve area  1.85cm^2)/  moderate LAE   VULVA /PERINEUM BIOPSY N/A 09/13/2017   Procedure: VULVAR BIOPSY;  Surgeon: Everitt Amber, MD;  Location: [facility];  Service: Gynecology;  Laterality: N/A;   VULVA Milagros Loll BIOPSY N/A 08/31/2021   Procedure: VULVAR BIOPSY;  Surgeon: Lahoma Crocker, MD;  Location: [facility];  Service: Gynecology;  Laterality: N/A;   Patient Active Problem List   Diagnosis Date Noted   Labial fusion 10/31/2021   Urinary frequency 12/27/2016   Dysuria 12/27/2016   Diabetes mellitus screening 12/27/2016   Cough 12/27/2016   Diabetes mellitus, new onset ([place]) 12/27/2016   Vulvar intraepithelial neoplasia (VIN) grade 3 10/04/2016   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 HEP helps for brief time but pt is still adamant she has "worm" that moves inside her pelvis and when it moves she feels crawling and when this happens her pain is worse. Pt reports she this happens she also tightens her legs and abdomen (demonstrated as well) to "stop the worm from entering".    PAIN:  Are you having pain? Yes NPRS scale: 8/10 Pain location: Vaginal  Pain type: burning and tight Pain description: constant   Aggravating factors: sitting, walking, bending, standing prolonged, lying, washing  Relieving factors: medications   PRECAUTIONS:  None  OCCUPATION: retired  PLOF: Independent  PATIENT GOALS to have less pain   PERTINENT HISTORY:  2018 for vaginal ulceration. This was biopsied in the OR and returned as extramammary pagets disease.   09/21/16, she underwent a complete simple vulvectomy.  08/2017 Intraoperative findings were significant for subtle acetowhite changes immediately lateral on the right side of the urethral meatus.  There also acetowhite changes to the midline distal vagina.  Vagina was completely agglutinated. 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  The posterior vaginal introitus biopsy revealed benign normal squamous mucosa. 07/2021 with sensation of "skin crawling". Had self treated for possible pinworms. Due to pain limiting exam, EUA and biopsies performed on 08/31/21. Bilateral biopsies were taken showing VIN3 with associated erosion and inflammation. No Paget's disease identified in these biopsies.  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, but only had removal of the clitoris and denies closure of labia Sexual abuse: No  BOWEL MOVEMENT Pain with bowel movement: Yes Type of bowel movement:Type (Bristol Stool Scale) 3-4, Frequency daily, and Strain Yes Fully empty rectum: Yes:   Leakage: No Pads: No Fiber supplement: No  URINATION Pain with urination: Yes Fully empty bladder: No Stream: Strong Urgency: Yes:   Frequency: 1-2 hours Leakage:  none Pads: No  INTERCOURSE Pain with intercourse:  not active Ability to have vaginal penetration:  No   PREGNANCY Vaginal deliveries 6 Tearing Yes: with 2 C-section deliveries 0 Currently pregnant No  PROLAPSE Heaviness/bulge sometimes worst in shower    OBJECTIVE:   DIAGNOSTIC FINDINGS:    COGNITION:  Overall cognitive status: Within functional limits for tasks assessed     SENSATION:  Light touch: Deficits tingling in Lt fingers  Proprioception: Appears intact  MUSCLE LENGTH: Bil hamstrings and adductors  limited by 75% but also has pain with mobility    GAIT: Distance walked: 30' Assistive device utilized: Single point cane Level of assistance: SBA Comments: decreased bil step height, poor cadence, forward head carriage, decreased bil arm swing, decreased stride length               POSTURE: rounded shoulders, forward head, and posterior pelvic tilt   LUMBARAROM/PROM  A/PROM A/PROM  eval  Flexion Limited by 75%  Extension Limited by 50%  Right lateral flexion Limited by 50%  Left lateral flexion Limited by 50%  Right rotation Limited by 50%  Left rotation Limited by 50%   (Blank rows = not tested)  LOWER EXTREMITY ROM:  Limited due to pain at bil LE by 25% grossly.   LOWER EXTREMITY MMT:  Bil hips 3/5 grossly; bil knees and ankles 4/5   PALPATION:   General  TTP at bil proximal adductors, bil lower abdominal quadrants with fascial restrictions, TTP at pubic bone  External Perineal Exam pt deferred today (eval)                             Internal Pelvic Floor pt deferred today (eval)  Patient confirms identification and approves PT to assess internal pelvic floor and treatment Yes but not today, plans for next session, grandson present during eval.   Pt unable to have internal exam due to anatomy of vaginal and labia. This is fused.   PELVIC MMT:   MMT eval  Vaginal   Internal Anal Sphincter   External Anal Sphincter   Puborectalis   Diastasis Recti   (Blank rows = not tested)        TONE: deferred  PROLAPSE: deferred  TODAY'S TREATMENT   11/22/2021  Manual work: addaday used bil adductors, lower abdomen, supra pubic region, hip flexors. Pt has profound tightness felt throughout these areas. Verbal cues throughout for relaxation techniques, diaphragmatic breathing. Pt demonstrated good release with addaday and intermittent manual soft tissue massage. Pt reported decreased pain from 8/10>4/10 after this.  Butterfly stretch also instructed  during end of manual for further stretching adductors. Pt reported she didn't have increase in pain with this. Pt pain localized to between her legs, pt pointed to medial pelvic floor region.  Pt educated to attempt relaxation at home, decreased amount of time clenching between legs and at thighs and when crawling/cramp felt to attempt diaphragmatic breathing and stretching instead. Pt reports she feels like she has to stay clenched all of the time or the pain is worse. PT attempted to suggest pt attempt this at home because her pain decreased during session with relaxation however pt reports she feels like if she relaxes "the worm will get in". Pt has appointment with family doctor tomorrow to further assess crawling sensation.   11/14/21  Manual work at lower abdomen with gentle fascial release with indirect method, soft tissue mobility at proximal adductors and hip flexors. Pt consented to mobilization of tissue at external pelvic floor, pt does have fusion of bil labia majora therefore unable to have internal exam and has tension throughout external assessment of pelvic floor. Pt reports with palpation of tissue at Rt groin she could feel the crawling/worm-like sensation moving. Pt very uncomfortable by this and reports this is the main course of her pain.  3x30s single leg butterfly stretch 3x30s single leg knee to chest with PT assisting due to pain's limited mobility and stretching only within pt's tolerance of range.     PATIENT EDUCATION:  Education details: [place] Person educated: Patient and Child(ren) Education method: Explanation, Demonstration, Tactile cues, Verbal cues, and Handouts Education comprehension: verbalized understanding and returned demonstration   HOME EXERCISE PROGRAM: Access Code: [email or web address] URL: [email or web address] Date: 11/08/2021 Prepared by: [facility]  Exercises - Supine Diaphragmatic Breathing   - 1 x daily - 7 x weekly - 1 sets - 10 reps - Unilateral Supported Supine Butterfly Stretch  - 1 x daily - 7 x weekly - 1 sets - 3 reps - 30s holds - Supine Lower Trunk Rotation  - 1 x daily - 7 x weekly - 1 sets - 3 reps - 30s holds  ASSESSMENT:  CLINICAL IMPRESSION: Patient presents with same symptoms, still limited with all mobility, reports she does not feel she is retaining any urine now. P Pt session focused on manual very gentle mobility at lower abdomen, hip flexors, adductors, and  suprapubic region.  Pt tolerated well and had decrease In pain. Pt educated to attempt stretching and relaxation techniques when cramping/crawling sensation felt instead of clenching. Pt wants to talk to doctor tomorrow more about this.  Pt would benefit from additional PT to further address deficits.     OBJECTIVE IMPAIRMENTS decreased activity tolerance, decreased coordination, decreased endurance, decreased mobility, difficulty walking, decreased strength, increased fascial restrictions, increased muscle spasms, impaired flexibility, improper body mechanics, postural dysfunction, and pain.   ACTIVITY LIMITATIONS carrying, lifting, bending, sitting, standing, squatting, sleeping, stairs, transfers, bed mobility, bathing, toileting, and locomotion level  PARTICIPATION LIMITATIONS: cleaning, laundry, interpersonal relationship, shopping, community activity, and yard work  PERSONAL FACTORS Age, Past/current experiences, Time since onset of injury/illness/exacerbation, and 3+ comorbidities: complex medical history  are also affecting patient's functional outcome.   REHAB POTENTIAL: Good  CLINICAL DECISION MAKING: Evolving/moderate complexity  EVALUATION COMPLEXITY: Moderate   GOALS: Goals reviewed with patient? Yes  SHORT TERM GOALS: Target date: 12/06/2021  Pt to be I with HEP.  Baseline: Goal status: INITIAL  2.  Pt will report 25% reduction of pain due to improvements in posture, strength, and  muscle length  Baseline:  Goal status: INITIAL  3.  Pt to report improved ability to fully void bowels and bladder at least 50% of the time due to improved mechanics and evacuation techniques.  Baseline:  Goal status: INITIAL    LONG TERM GOALS: Target date:  02/08/22    Pt to be I with advanced HEP.  Baseline:  Goal status: INITIAL  2.   Pt will report 50% reduction of pain due to improvements in posture, strength, and muscle length  Baseline:  Goal status: INITIAL  3.  Pt to report ability to sit without pain higher than 5/10 for at least 30 mins for improved QOL.  Baseline:  Goal status: INITIAL  4.  Pt demonstrate improved coordination of breathing and pelvic floor for improved pelvic relaxation for decreased pain for improved QOL at least 50% of the time.  Baseline:  Goal status: INITIAL    PLAN: PT FREQUENCY: 1x/week  PT DURATION:  8 sessions  PLANNED INTERVENTIONS: Therapeutic exercises, Therapeutic activity, Neuromuscular re-education, Patient/Family education, Self Care, Joint mobilization, Aquatic Therapy, Dry Needling, Spinal mobilization, Cryotherapy, Moist heat, Manual lymph drainage, scar mobilization, Taping, Biofeedback, and Manual therapy  PLAN FOR NEXT SESSION: external pelvic floor mobility,  abdominal/hip/back manual and stretching    Stacy Gardner, PT, DPT 08/01/231:25 PM

## 2021-11-23 ENCOUNTER — Ambulatory Visit (INDEPENDENT_AMBULATORY_CARE_PROVIDER_SITE_OTHER): Payer: Commercial Managed Care - HMO | Admitting: Physician Assistant

## 2021-11-23 VITALS — BP 132/78 | HR 82 | Temp 97.8°F | Ht <= 58 in | Wt 172.0 lb

## 2021-11-23 DIAGNOSIS — R102 Pelvic and perineal pain: Secondary | ICD-10-CM

## 2021-11-23 DIAGNOSIS — D508 Other iron deficiency anemias: Secondary | ICD-10-CM | POA: Diagnosis not present

## 2021-11-23 DIAGNOSIS — F419 Anxiety disorder, unspecified: Secondary | ICD-10-CM

## 2021-11-23 DIAGNOSIS — I10 Essential (primary) hypertension: Secondary | ICD-10-CM

## 2021-11-23 DIAGNOSIS — Z23 Encounter for immunization: Secondary | ICD-10-CM | POA: Diagnosis not present

## 2021-11-23 DIAGNOSIS — D071 Carcinoma in situ of vulva: Secondary | ICD-10-CM

## 2021-11-23 DIAGNOSIS — E1169 Type 2 diabetes mellitus with other specified complication: Secondary | ICD-10-CM | POA: Diagnosis not present

## 2021-11-23 DIAGNOSIS — E785 Hyperlipidemia, unspecified: Secondary | ICD-10-CM | POA: Diagnosis not present

## 2021-11-23 DIAGNOSIS — E1165 Type 2 diabetes mellitus with hyperglycemia: Secondary | ICD-10-CM

## 2021-11-23 DIAGNOSIS — G479 Sleep disorder, unspecified: Secondary | ICD-10-CM

## 2021-11-23 DIAGNOSIS — Z1159 Encounter for screening for other viral diseases: Secondary | ICD-10-CM

## 2021-11-23 DIAGNOSIS — E119 Type 2 diabetes mellitus without complications: Secondary | ICD-10-CM

## 2021-11-23 LAB — LIPID PANEL
Cholesterol: 118 mg/dL (ref 0–200)
HDL: 38.6 mg/dL — ABNORMAL LOW (ref >39.00)
LDL Cholesterol: 52 mg/dL (ref 0–99)
NonHDL: 78.92
Total CHOL/HDL Ratio: 3
Triglycerides: 135 mg/dL (ref 0.0–149.0)
VLDL: 27 mg/dL (ref 0.0–40.0)

## 2021-11-23 LAB — CBC WITH DIFFERENTIAL/PLATELET
Basophils Absolute: 0 10*3/uL (ref 0.0–0.1)
Basophils Relative: 0.4 % (ref 0.0–3.0)
Eosinophils Absolute: 0.1 10*3/uL (ref 0.0–0.7)
Eosinophils Relative: 2.1 % (ref 0.0–5.0)
HCT: 36 % (ref 36.0–46.0)
Hemoglobin: 11.2 g/dL — ABNORMAL LOW (ref 12.0–15.0)
Lymphocytes Relative: 42.8 % (ref 12.0–46.0)
Lymphs Abs: 2.5 10*3/uL (ref 0.7–4.0)
MCHC: 31 g/dL (ref 30.0–36.0)
MCV: 73.5 fl — ABNORMAL LOW (ref 78.0–100.0)
Monocytes Absolute: 0.6 10*3/uL (ref 0.1–1.0)
Monocytes Relative: 9.7 % (ref 3.0–12.0)
Neutro Abs: 2.7 10*3/uL (ref 1.4–7.7)
Neutrophils Relative %: 45 % (ref 43.0–77.0)
Platelets: 289 10*3/uL (ref 150.0–400.0)
RBC: 4.89 Mil/uL (ref 3.87–5.11)
RDW: 16.6 % — ABNORMAL HIGH (ref 11.5–15.5)
WBC: 5.9 10*3/uL (ref 4.0–10.5)

## 2021-11-23 LAB — COMPREHENSIVE METABOLIC PANEL
ALT: 51 U/L — ABNORMAL HIGH (ref 0–35)
AST: 39 U/L — ABNORMAL HIGH (ref 0–37)
Albumin: 4.3 g/dL (ref 3.5–5.2)
Alkaline Phosphatase: 78 U/L (ref 39–117)
BUN: 10 mg/dL (ref 6–23)
CO2: 26 mEq/L (ref 19–32)
Calcium: 9.5 mg/dL (ref 8.4–10.5)
Chloride: 102 mEq/L (ref 96–112)
Creatinine, Ser: 0.58 mg/dL (ref 0.40–1.20)
GFR: 93.12 mL/min (ref >60.00)
Glucose, Bld: 110 mg/dL — ABNORMAL HIGH (ref 70–99)
Potassium: 4 mEq/L (ref 3.5–5.1)
Sodium: 140 mEq/L (ref 135–145)
Total Bilirubin: 0.5 mg/dL (ref 0.2–1.2)
Total Protein: 7.2 g/dL (ref 6.0–8.3)

## 2021-11-23 LAB — HEMOGLOBIN A1C: Hgb A1c MFr Bld: 6.2 % (ref 4.6–6.5)

## 2021-11-23 LAB — TSH: TSH: 0.99 u[IU]/mL (ref 0.35–5.50)

## 2021-11-23 MED ORDER — TRAMADOL HCL 50 MG PO TABS: 50.0000 mg | ORAL_TABLET | Freq: Every evening | ORAL | 0 refills | Status: AC

## 2021-11-23 NOTE — Patient Instructions (Signed)
Fasting labs today  Referral to Dr. Theodis Shove for counseling at our office  Continue regular follow up with specialists  Trial Tramadol 50 mg at bedtime to help with pain; MyChart in a few weeks with how this is working.  Call sooner if any concerns.

## 2021-11-23 NOTE — Progress Notes (Signed)
Subjective:    Patient ID: Ellen Ryan, female    DOB: 06/16/1955, 66 y.o.   MRN: [id number]  Chief Complaint  Patient presents with   Follow-up    Pt in for f/u; pt is still having pain same as before; pt states she feels as though something is moving inside her but [place] and CT showed nothing, requesting test for parasites and MRI    HPI Patient with hx VIN III is in today for f/up from 06/29/21. She is here with her daughter.   Constant "crawling sensation" in vaginal area. She has been working with urogyn on this issue. Cannot sleep because of this feeling. This has been going on since February 2023. Changing position is very painful in vaginal area. Worse with walking, uses cane for balance because of pain. Daughter is wondering if tramadol at bedtime might help.  Flexeril and Gabapentin have not helped. Urinating well, bladder scan was normal. Daughter does not think this is urine being trapped. Pelvic floor PT once per week.  Miralax and [place], moving bowels normally. Denies any bleeding.   Also needs regular labs and f/up of chronic issues - See A/P.    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    Past Surgical History:  Procedure Laterality Date   CO2 LASER APPLICATION N/A 06/17/8248   Procedure: CO2 LASER APPLICATION OF THE VULVA;  Surgeon: Everitt Amber, MD;  Location: [place];  Service: Gynecology;  Laterality: N/A;   CO2 LASER APPLICATION  N/A 0/37/0488   Procedure: VAGINOSCOPY, VULVAR BIOPSIES;  Surgeon: Lafonda Mosses, MD;  Location: [facility];  Service: Gynecology;  Laterality: N/A;   EUA/ VULVA BIOPSY  09-01-2016  [name]  [place] N/A 09/21/2016   Procedure: RADICAL VULVECTOMY COMPLETE;  Surgeon: Everitt Amber, MD;  Location: [facility];  Service: Gynecology;  Laterality: N/A;   TRANSTHORACIC ECHOCARDIOGRAM  01/16/2017   ef 60-65%,  grade 1 diastolic dysfunction/  very mild AV stenosis with moderately thickened and calcified leaflets (valve area 1.85cm^2)/  moderate LAE   VULVA /PERINEUM BIOPSY N/A 09/13/2017   Procedure: VULVAR BIOPSY;  Surgeon: Everitt Amber, MD;  [facility];  Service: Gynecology;  Laterality: N/A;   VULVA Milagros Loll BIOPSY N/A 08/31/2021   Procedure: VULVAR BIOPSY;  Surgeon: Lahoma Crocker, MD;  Location: [facility];  Service: Gynecology;  Laterality: N/A;    Family History  Problem Relation Age of Onset   Stroke Mother    Arthritis Mother    Diabetes Father    Cancer Daughter     Social History   Tobacco Use   Smoking status: Never   Smokeless tobacco: Never  Vaping Use   Vaping Use: Never used  Substance Use Topics   Alcohol use: Not Currently   Drug use: No  Allergies  Allergen Reactions   Oxycodone Itching    Review of Systems NEGATIVE UNLESS OTHERWISE INDICATED IN HPI      Objective:     BP 132/78 (BP Location: Right Arm)   Pulse 82   Temp 97.8 F (36.6 C) (Temporal)   Ht _0  (1.473 m)   Wt 172 lb (78 kg)   SpO2 98%   BMI 35.95 kg/m   Wt Readings from Last 3 Encounters:  11/23/21 172 lb (78 kg)  10/31/21 168 lb (76.2 kg)  09/23/21 171 lb 15.3 oz (78 kg)    BP Readings from Last 3 Encounters:  11/23/21 132/78  11/07/21 138/84  10/31/21 133/74     Physical Exam Vitals and nursing note reviewed.  Constitutional:      Appearance: Normal appearance. She is obese. She is not  toxic-appearing.  HENT:     Head: Normocephalic and atraumatic.  Eyes:     Extraocular Movements: Extraocular movements intact.     Conjunctiva/sclera: Conjunctivae normal.     Pupils: Pupils are equal, round, and reactive to light.  Cardiovascular:     Rate and Rhythm: Normal rate and regular rhythm.     Pulses: Normal pulses.     Heart sounds: Normal heart sounds.  Pulmonary:     Effort: Pulmonary effort is normal.     Breath sounds: Normal breath sounds.  Musculoskeletal:        General: Normal range of motion.     Cervical back: Normal range of motion and neck supple.     Right lower leg: No edema.     Left lower leg: No edema.  Skin:    General: Skin is warm and dry.  Neurological:     General: No focal deficit present.     Mental Status: She is alert and oriented to person, place, and time.     Comments: Monofilament normal bilateral feet.    Psychiatric:        Mood and Affect: Mood normal.        Behavior: Behavior normal.        Thought Content: Thought content normal.        Judgment: Judgment normal.        Assessment & Plan:   Problem List Items Addressed This Visit       Cardiovascular and Mediastinum   Essential hypertension   Relevant Orders   CBC with Differential/Platelet (Completed)     Endocrine   Diabetes mellitus, new onset ([place])     Other   Vaginal pain   Relevant Orders   Parasite Exam, Blood   Ambulatory referral to Psychology   Other Visit Diagnoses     Type 2 diabetes mellitus with hyperglycemia, without long-term current use of insulin ([place])    -  Primary   Relevant Orders   CBC with Differential/Platelet (Completed)   Comprehensive metabolic panel (Completed)   Hemoglobin A1c (Completed)   TSH (Completed)   Other iron deficiency anemia       Relevant Orders   CBC with Differential/Platelet (Completed)   Iron, TIBC and Ferritin Panel (Completed)   Hyperlipidemia associated with type 2 diabetes mellitus ([place])       Relevant  Orders   Lipid panel (Completed)   VIN III (vulvar intraepithelial neoplasia III)       Relevant Orders   Ambulatory referral to Psychology   Anxiety       Relevant Orders   TSH (Completed)   Parasite Exam,  Blood   Ambulatory referral to Psychology   Sleep disturbance       Relevant Orders   TSH (Completed)   Parasite Exam, Blood   Ambulatory referral to Psychology   Need for prophylactic vaccination and inoculation against varicella       Relevant Orders   Zoster Recombinant (Shingrix ) (Completed)   Need for prophylactic vaccination with combined diphtheria-tetanus-pertussis (DTP) vaccine       Relevant Orders   Tdap vaccine greater than or equal to 7yo IM (Completed)   Need for hepatitis C screening test       Relevant Orders   Hepatitis C Antibody (Completed)        Meds ordered this encounter  Medications   traMADol (ULTRAM) 50 MG tablet    Sig: Take 1 tablet (50 mg total) by mouth at bedtime for 15 days.    Dispense:  15 tablet    Refill:  0    Order Specific Question:   Supervising Provider    Answer:   Yong Channel, STEPHEN O [[id number]]    1. Type 2 diabetes mellitus with hyperglycemia, without long-term current use of insulin ([place]) Plan to update labs today  Foot exam normal Uses Freestyle kit Needs annual eye exam. Cont taking Metformin 500 mg BID  2. Other iron deficiency anemia Recheck labs today Take iron supplement qod with Vit C  3. Hyperlipidemia associated with type 2 diabetes mellitus ([place]) Recheck fasting labs today Not on statin therapy, hx diabetes, need to consider starting treatment  4. Essential hypertension BP is stable Cont amlodipine 10 mg daily   5. VIN III (vulvar intraepithelial neoplasia III) 6. Vaginal pain Cont to work with UROGYN, I reviewed notes today PDMP reviewed today, no red flags Trial Tramadol 50 mg 1/2 to 1 tab at bedtime for pain. Pt aware of risks vs benefits and possible adverse reactions, strongly cautioned about dizzy  / drowsiness, fall risk. Daughter to update me how she's doing. Agreed to check for parasites, although concerned more about memory issues / OCD at this time.   7. Anxiety Refer to psychology  8. Sleep disturbance 2/2 to #5 & 6 -  9. Need for prophylactic vaccination and inoculation against varicella Completed vaccination today  10. Need for prophylactic vaccination with combined diphtheria-tetanus-pertussis (DTP) vaccine Updated today  11. Need for hepatitis C screening test Labs today    Return in about 3 months (around 02/23/2022) for recheck.  This note was prepared with assistance of Systems analyst. Occasional wrong-word or sound-a-like substitutions may have occurred due to the inherent limitations of voice recognition software.  Time Spent: 36 minutes of total time was spent on the date of the encounter performing the following actions: chart review prior to seeing the patient, obtaining history, performing a medically necessary exam, counseling on the treatment plan, placing orders, and documenting in our EHR.     Emaad Nanna M Catarino Vold, PA-C

## 2021-11-24 LAB — IRON,TIBC AND FERRITIN PANEL
%SAT: 14 % (calc) — ABNORMAL LOW (ref 16–45)
Ferritin: 25 ng/mL (ref 16–288)
Iron: 47 ug/dL (ref 45–160)
TIBC: 344 mcg/dL (calc) (ref 250–450)

## 2021-11-24 LAB — HEPATITIS C ANTIBODY: Hepatitis C Ab: NONREACTIVE

## 2021-11-29 ENCOUNTER — Ambulatory Visit: Payer: Commercial Managed Care - HMO | Admitting: Physical Therapy

## 2021-11-29 DIAGNOSIS — M6281 Muscle weakness (generalized): Secondary | ICD-10-CM | POA: Diagnosis not present

## 2021-11-29 DIAGNOSIS — R279 Unspecified lack of coordination: Secondary | ICD-10-CM

## 2021-11-29 DIAGNOSIS — M62838 Other muscle spasm: Secondary | ICD-10-CM

## 2021-11-29 DIAGNOSIS — R293 Abnormal posture: Secondary | ICD-10-CM

## 2021-11-29 DIAGNOSIS — R269 Unspecified abnormalities of gait and mobility: Secondary | ICD-10-CM

## 2021-11-29 NOTE — Therapy (Signed)
OUTPATIENT PHYSICAL THERAPY FEMALE PELVIC EVALUATION   Patient Name: Ellen Ryan MRN: [id number] DOB:1955-05-22, 66 y.o., female Today's Date: 11/29/2021   PT End of Session - 11/29/21 1107     Visit Number 4    Date for PT Re-Evaluation 02/08/22    Authorization Type [employer]    PT Start Time 1104    PT Stop Time 1143    PT Time Calculation (min) 39 min    Activity Tolerance Patient tolerated treatment well;Patient limited by pain    Behavior During Therapy [facility] for tasks assessed/performed              Past Medical History:  Diagnosis Date   Aortic valve stenosis    per echo 01-16-2017  very mild AV stenosis w/ valve area 1.85cm^2   Arthritis    both knees   Disc degeneration, lumbar    L4-L5   Grade I diastolic dysfunction 65/06/5463   Noted on ECHO   History of dysplasia of vulva    09-21-2017  VIN3   Hyperlipidemia    Hypertension    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[place])    extramammary-- s/p complete vulvectomy 09-21-2016   Type 2 diabetes mellitus ([place]) 12/27/2016   Uterine fibroid    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   Past Surgical History:  Procedure Laterality Date   CO2 LASER APPLICATION N/A 6/81/2751   Procedure: CO2 LASER APPLICATION OF THE VULVA;  Surgeon: Everitt Amber, MD;  Location: [place];  Service: Gynecology;  Laterality: N/A;   CO2 LASER APPLICATION N/A 7/00/1749   Procedure: VAGINOSCOPY, VULVAR BIOPSIES;  Surgeon: Lafonda Mosses, MD;  Location: [facility];  Service: Gynecology;  Laterality: N/A;   EUA/ VULVA BIOPSY  09-01-2016  [name]  [place] N/A 09/21/2016   Procedure: RADICAL VULVECTOMY COMPLETE;  Surgeon: Everitt Amber, MD;  Location: [facility];  Service: Gynecology;  Laterality: N/A;   TRANSTHORACIC ECHOCARDIOGRAM  01/16/2017   ef 60-65%,  grade 1 diastolic dysfunction/  very mild AV stenosis with moderately thickened and calcified leaflets (valve area  1.85cm^2)/  moderate LAE   VULVA /PERINEUM BIOPSY N/A 09/13/2017   Procedure: VULVAR BIOPSY;  Surgeon: Everitt Amber, MD;  Location: [facility];  Service: Gynecology;  Laterality: N/A;   VULVA Milagros Loll BIOPSY N/A 08/31/2021   Procedure: VULVAR BIOPSY;  Surgeon: Lahoma Crocker, MD;  Location: [facility];  Service: Gynecology;  Laterality: N/A;   Patient Active Problem List   Diagnosis Date Noted   Labial fusion 10/31/2021   Urinary frequency 12/27/2016   Dysuria 12/27/2016   Diabetes mellitus screening 12/27/2016   Cough 12/27/2016   Diabetes mellitus, new onset ([place]) 12/27/2016   Vulvar intraepithelial neoplasia (VIN) grade 3 10/04/2016   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 Pt reports she is feeling same pain.    PAIN:  Are you having pain? Yes NPRS scale: 8/10 Pain location: Vaginal  Pain type: burning and tight Pain description: constant   Aggravating factors: sitting, walking, bending, standing prolonged, lying, washing  Relieving factors: medications   PRECAUTIONS: None  OCCUPATION: retired  PLOF: Independent  PATIENT GOALS to have less pain    PERTINENT HISTORY:  2018 for vaginal ulceration. This was biopsied in the OR and returned as extramammary pagets disease.   09/21/16, she underwent a complete simple vulvectomy.  08/2017 Intraoperative findings were significant for subtle acetowhite changes immediately lateral on the right side of the urethral meatus.  There also acetowhite changes to the midline distal vagina.  Vagina was completely agglutinated. 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  The posterior vaginal introitus biopsy revealed benign normal squamous mucosa. 07/2021 with sensation of "skin crawling". Had self treated for possible pinworms. Due to pain limiting exam, EUA and biopsies performed on 08/31/21. Bilateral biopsies were taken showing VIN3 with associated erosion and inflammation. No Paget's disease identified in these biopsies.  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, but only had removal of the clitoris and denies closure of labia Sexual abuse: No  BOWEL MOVEMENT Pain with bowel movement: Yes Type of bowel movement:Type (Bristol Stool Scale) 3-4, Frequency daily, and Strain Yes Fully empty rectum: Yes:   Leakage: No Pads: No Fiber supplement: No  URINATION Pain with urination: Yes Fully empty bladder: No Stream: Strong Urgency: Yes:   Frequency: 1-2 hours Leakage:  none Pads: No  INTERCOURSE Pain with intercourse:  not active Ability to have vaginal penetration:  No   PREGNANCY Vaginal deliveries 6 Tearing Yes: with 2 C-section deliveries 0 Currently pregnant No  PROLAPSE Heaviness/bulge sometimes worst in shower    OBJECTIVE:   DIAGNOSTIC FINDINGS:    COGNITION:  Overall cognitive status: Within functional limits for tasks assessed     SENSATION:  Light touch: Deficits tingling in Lt fingers  Proprioception: Appears intact  MUSCLE LENGTH: Bil hamstrings and adductors limited by 75% but also has pain with mobility    GAIT: Distance walked:  30' Assistive device utilized: Single point cane Level of assistance: SBA Comments: decreased bil step height, poor cadence, forward head carriage, decreased bil arm swing, decreased stride length               POSTURE: rounded shoulders, forward head, and posterior pelvic tilt   LUMBARAROM/PROM  A/PROM A/PROM  eval  Flexion Limited by 75%  Extension Limited by 50%  Right lateral flexion Limited by 50%  Left lateral flexion Limited by 50%  Right rotation Limited by 50%  Left rotation Limited by 50%   (Blank rows = not tested)  LOWER EXTREMITY ROM:  Limited due to pain at bil LE by 25% grossly.   LOWER EXTREMITY MMT:  Bil hips 3/5 grossly; bil knees and ankles 4/5   PALPATION:   General  TTP at bil proximal adductors, bil lower abdominal quadrants with fascial restrictions, TTP at pubic bone                External Perineal Exam pt deferred today (eval)                             Internal  Pelvic Floor pt deferred today (eval)  Patient confirms identification and approves PT to assess internal pelvic floor and treatment Yes but not today, plans for next session, grandson present during eval.   Pt unable to have internal exam due to anatomy of vaginal and labia. This is fused.   PELVIC MMT:   MMT eval  Vaginal   Internal Anal Sphincter   External Anal Sphincter   Puborectalis   Diastasis Recti   (Blank rows = not tested)        TONE: deferred  PROLAPSE: deferred  TODAY'S TREATMENT   11/29/21: Manual work: addaday used bil adductors, lower abdomen, supra pubic region, hip flexors. Pt has profound tightness felt throughout these areas. Verbal cues throughout for relaxation techniques, diaphragmatic breathing. Pt demonstrated fair release with addaday and intermittent manual soft tissue massage however today pt tolerated less hands on manual work with increased repetitive bil adductor clenching reporting she tightens her legs as much as she can to "stop the worm  from entering". Pt attempted to verbalize testing so far has been (-) for worms however pt is adamant this is what is causing her pain and the only way to stop pain from progressing is to clench. With extra time and addaday pt reports she does feel better however. Pt also reports she has lesion area with "raw skin" that is sensitive with pre cancer site being treated that causes her a lot of pain with sitting too. Pt reports skin is not open just sensitive to touch or pressure. Manual with gentle stretching in very small mobility at bil adductors, over pubic bone in all directions, and slightly at medial pelvic region as tolerated by pt with very gentle stretching. Pt reported decreased pain from 8/10>6/10 after this, additional addaday in same areas after stretching (below) and pt reported 3-4/10 pain.   3x30s butterfly stretch 2x30s manual assist from PT hamstring stretch bil      PATIENT EDUCATION:  Education details: [place] educated: Patient and Child(ren) Education method: Explanation, Demonstration, Tactile cues, Verbal cues, and Handouts Education comprehension: verbalized understanding and returned demonstration   HOME EXERCISE PROGRAM: Access Code: [email or web address] URL: [email or web address] Date: 11/08/2021 Prepared by: [facility]  Exercises - Supine Diaphragmatic Breathing  - 1 x daily - 7 x weekly - 1 sets - 10 reps - Unilateral Supported Supine Butterfly Stretch  - 1 x daily - 7 x weekly - 1 sets - 3 reps - 30s holds - Supine Lower Trunk Rotation  - 1 x daily - 7 x weekly - 1 sets - 3 reps - 30s holds  ASSESSMENT:  CLINICAL IMPRESSION: Patient presents with same symptoms reports addaday helped keep pain lower throughout the rest of the day after last session, still limited with all mobility, reports she does not feel she is retaining any urine now. Pt session focused on manual very gentle mobility at lower abdomen,  hip flexors, adductors, and suprapubic region.  Pt tolerated well and had decrease In pain. Pt educated to attempt stretching and relaxation techniques when cramping/crawling sensation felt instead of clenching. Pt and daughter report PCP deferred MRI at this time. Pt reports she has to tighten her thighs as much as she can to keep "the worm from entering or moving" which is very painful for pt. Pt educated that as of now her testing has been (-) for worms however she reports this is what is causing her pain and she has to tightening  or pain is worse. Pt also has pain from skin area at Lt inner thigh that hurts with sitting as well from "pre-cancer" area being treated by Dr. Glennon Mac per pt's daughter. Pt would benefit from additional PT to further address deficits.     OBJECTIVE IMPAIRMENTS decreased activity tolerance, decreased coordination, decreased endurance, decreased mobility, difficulty walking, decreased strength, increased fascial restrictions, increased muscle spasms, impaired flexibility, improper body mechanics, postural dysfunction, and pain.   ACTIVITY LIMITATIONS carrying, lifting, bending, sitting, standing, squatting, sleeping, stairs, transfers, bed mobility, bathing, toileting, and locomotion level  PARTICIPATION LIMITATIONS: cleaning, laundry, interpersonal relationship, shopping, community activity, and yard work  PERSONAL FACTORS Age, Past/current experiences, Time since onset of injury/illness/exacerbation, and 3+ comorbidities: complex medical history  are also affecting patient's functional outcome.   REHAB POTENTIAL: Good  CLINICAL DECISION MAKING: Evolving/moderate complexity  EVALUATION COMPLEXITY: Moderate   GOALS: Goals reviewed with patient? Yes  SHORT TERM GOALS: Target date: 12/06/2021  Pt to be I with HEP.  Baseline: Goal status: INITIAL  2.  Pt will report 25% reduction of pain due to improvements in posture, strength, and muscle length  Baseline:  Goal  status: INITIAL  3.  Pt to report improved ability to fully void bowels and bladder at least 50% of the time due to improved mechanics and evacuation techniques.  Baseline:  Goal status: INITIAL    LONG TERM GOALS: Target date:  02/08/22    Pt to be I with advanced HEP.  Baseline:  Goal status: INITIAL  2.   Pt will report 50% reduction of pain due to improvements in posture, strength, and muscle length  Baseline:  Goal status: INITIAL  3.  Pt to report ability to sit without pain higher than 5/10 for at least 30 mins for improved QOL.  Baseline:  Goal status: INITIAL  4.  Pt demonstrate improved coordination of breathing and pelvic floor for improved pelvic relaxation for decreased pain for improved QOL at least 50% of the time.  Baseline:  Goal status: INITIAL    PLAN: PT FREQUENCY: 1x/week  PT DURATION:  8 sessions  PLANNED INTERVENTIONS: Therapeutic exercises, Therapeutic activity, Neuromuscular re-education, Patient/Family education, Self Care, Joint mobilization, Aquatic Therapy, Dry Needling, Spinal mobilization, Cryotherapy, Moist heat, Manual lymph drainage, scar mobilization, Taping, Biofeedback, and Manual therapy  PLAN FOR NEXT SESSION: external pelvic floor mobility,  abdominal/hip/back manual and stretching    Stacy Gardner, PT, DPT 11/29/2309:52 AM

## 2021-11-30 LAB — PARASITE EXAM, BLOOD

## 2021-12-06 ENCOUNTER — Ambulatory Visit: Payer: Commercial Managed Care - HMO | Admitting: Physical Therapy

## 2021-12-06 DIAGNOSIS — M6281 Muscle weakness (generalized): Secondary | ICD-10-CM | POA: Diagnosis not present

## 2021-12-06 DIAGNOSIS — M62838 Other muscle spasm: Secondary | ICD-10-CM

## 2021-12-06 DIAGNOSIS — R279 Unspecified lack of coordination: Secondary | ICD-10-CM

## 2021-12-06 NOTE — Therapy (Signed)
OUTPATIENT PHYSICAL THERAPY FEMALE PELVIC EVALUATION   Patient Name: Marlinda Miranda MRN: [id number] DOB:Mar 08, 1956, 66 y.o., female Today's Date: 12/06/2021   PT End of Session - 12/06/21 1105     Visit Number 5    Date for PT Re-Evaluation 02/08/22    Authorization Type [employer]    PT Start Time 1103    PT Stop Time 1141    PT Time Calculation (min) 38 min    Activity Tolerance Patient tolerated treatment well;Patient limited by pain    Behavior During Therapy [facility] for tasks assessed/performed              Past Medical History:  Diagnosis Date   Aortic valve stenosis    per echo 01-16-2017  very mild AV stenosis w/ valve area 1.85cm^2   Arthritis    both knees   Disc degeneration, lumbar    L4-L5   Grade I diastolic dysfunction 03/50/0938   Noted on ECHO   History of dysplasia of vulva    09-21-2017  VIN3   Hyperlipidemia    Hypertension    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[place])    extramammary-- s/p complete vulvectomy 09-21-2016   Type 2 diabetes mellitus ([place]) 12/27/2016   Uterine fibroid    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   Past Surgical History:  Procedure Laterality Date   CO2 LASER APPLICATION N/A 1/82/9937   Procedure: CO2 LASER APPLICATION OF THE VULVA;  Surgeon: Everitt Amber, MD;  Location: [place];  Service: Gynecology;  Laterality: N/A;   CO2 LASER APPLICATION N/A 1/69/6789   Procedure: VAGINOSCOPY, VULVAR BIOPSIES;  Surgeon: Lafonda Mosses, MD;  Location: [facility];  Service: Gynecology;  Laterality: N/A;   EUA/ VULVA BIOPSY  09-01-2016  [name]  [place] N/A 09/21/2016   Procedure: RADICAL VULVECTOMY COMPLETE;  Surgeon: Everitt Amber, MD;  Location: [facility];  Service: Gynecology;  Laterality: N/A;   TRANSTHORACIC ECHOCARDIOGRAM  01/16/2017   ef 60-65%,  grade 1 diastolic dysfunction/  very mild AV stenosis with moderately thickened and calcified leaflets (valve area  1.85cm^2)/  moderate LAE   VULVA /PERINEUM BIOPSY N/A 09/13/2017   Procedure: VULVAR BIOPSY;  Surgeon: Everitt Amber, MD;  Location: [facility];  Service: Gynecology;  Laterality: N/A;   VULVA Milagros Loll BIOPSY N/A 08/31/2021   Procedure: VULVAR BIOPSY;  Surgeon: Lahoma Crocker, MD;  Location: [facility];  Service: Gynecology;  Laterality: N/A;   Patient Active Problem List   Diagnosis Date Noted   Labial fusion 10/31/2021   Urinary frequency 12/27/2016   Dysuria 12/27/2016   Diabetes mellitus screening 12/27/2016   Cough 12/27/2016   Diabetes mellitus, new onset ([place]) 12/27/2016   Vulvar intraepithelial neoplasia (VIN) grade 3 10/04/2016   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 Pt reports Tamadol helps.    PAIN:  Are you having pain? Yes NPRS scale: 6/10 Pain location: Vaginal  Pain type: burning and tight Pain description: constant   Aggravating factors: sitting, walking, bending, standing prolonged, lying, washing  Relieving factors: medications   PRECAUTIONS: None  OCCUPATION: retired  PLOF: Independent  PATIENT GOALS to have less pain   PERTINENT HISTORY:  2018  for vaginal ulceration. This was biopsied in the OR and returned as extramammary pagets disease.   09/21/16, she underwent a complete simple vulvectomy.  08/2017 Intraoperative findings were significant for subtle acetowhite changes immediately lateral on the right side of the urethral meatus.  There also acetowhite changes to the midline distal vagina.  Vagina was completely agglutinated. 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  The posterior vaginal introitus biopsy revealed benign normal squamous mucosa. 07/2021 with sensation of "skin crawling". Had self treated for possible pinworms. Due to pain limiting exam, EUA and biopsies performed on 08/31/21. Bilateral biopsies were taken showing VIN3 with associated erosion and inflammation. No Paget's disease identified in these biopsies.  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, but only had removal of the clitoris and denies closure of labia Sexual abuse: No  BOWEL MOVEMENT Pain with bowel movement: Yes Type of bowel movement:Type (Bristol Stool Scale) 3-4, Frequency daily, and Strain Yes Fully empty rectum: Yes:   Leakage: No Pads: No Fiber supplement: No  URINATION Pain with urination: Yes Fully empty bladder: No Stream: Strong Urgency: Yes:   Frequency: 1-2 hours Leakage:  none Pads: No  INTERCOURSE Pain with intercourse:  not active Ability to have vaginal penetration:  No   PREGNANCY Vaginal deliveries 6 Tearing Yes: with 2 C-section deliveries 0 Currently pregnant No  PROLAPSE Heaviness/bulge sometimes worst in shower    OBJECTIVE:   DIAGNOSTIC FINDINGS:    COGNITION:  Overall cognitive status: Within functional limits for tasks assessed     SENSATION:  Light touch: Deficits tingling in Lt fingers  Proprioception: Appears intact  MUSCLE LENGTH: Bil hamstrings and adductors limited by 75% but also has pain with mobility    GAIT: Distance walked: 30' Assistive device utilized: Single  point cane Level of assistance: SBA Comments: decreased bil step height, poor cadence, forward head carriage, decreased bil arm swing, decreased stride length               POSTURE: rounded shoulders, forward head, and posterior pelvic tilt   LUMBARAROM/PROM  A/PROM A/PROM  eval  Flexion Limited by 75%  Extension Limited by 50%  Right lateral flexion Limited by 50%  Left lateral flexion Limited by 50%  Right rotation Limited by 50%  Left rotation Limited by 50%   (Blank rows = not tested)  LOWER EXTREMITY ROM:  Limited due to pain at bil LE by 25% grossly.   LOWER EXTREMITY MMT:  Bil hips 3/5 grossly; bil knees and ankles 4/5   PALPATION:   General  TTP at bil proximal adductors, bil lower abdominal quadrants with fascial restrictions, TTP at pubic bone                External Perineal Exam pt deferred today (eval)  Internal Pelvic Floor pt deferred today (eval)  Patient confirms identification and approves PT to assess internal pelvic floor and treatment Yes but not today, plans for next session, grandson present during eval.   Pt unable to have internal exam due to anatomy of vaginal and labia. This is fused.   PELVIC MMT:   MMT eval  Vaginal   Internal Anal Sphincter   External Anal Sphincter   Puborectalis   Diastasis Recti   (Blank rows = not tested)        TONE: deferred  PROLAPSE: deferred  TODAY'S TREATMENT   12/06/2021 : Manual work: addaday used bil adductors, lower abdomen, supra pubic region, hip flexors and bil abductors, pain started at 6/10 and lowered to 4/10 after this. Tightness improved with addaday though still present, pt reported feeling better after this. Cues throughout for pelvic and global relaxation and for diaphragmatic breathing. Pt demonstrated improved mobility in bil hips today with improved butterfly stretch tolerance.  Stretching:  bil butterfly stretching 3x1 min today with intermittent manual for  for soft tissue release as needed Manual assist for bil hamstring stretching 2x30s, hip IR and ER stretch 2x30s each, and bil hip flexor 2x30s    11/29/21: Manual work: addaday used bil adductors, lower abdomen, supra pubic region, hip flexors. Pt has profound tightness felt throughout these areas. Verbal cues throughout for relaxation techniques, diaphragmatic breathing. Pt demonstrated fair release with addaday and intermittent manual soft tissue massage however today pt tolerated less hands on manual work with increased repetitive bil adductor clenching reporting she tightens her legs as much as she can to "stop the worm from entering". Pt attempted to verbalize testing so far has been (-) for worms however pt is adamant this is what is causing her pain and the only way to stop pain from progressing is to clench. With extra time and addaday pt reports she does feel better however. Pt also reports she has lesion area with "raw skin" that is sensitive with pre cancer site being treated that causes her a lot of pain with sitting too. Pt reports skin is not open just sensitive to touch or pressure. Manual with gentle stretching in very small mobility at bil adductors, over pubic bone in all directions, and slightly at medial pelvic region as tolerated by pt with very gentle stretching. Pt reported decreased pain from 8/10>6/10 after this, additional addaday in same areas after stretching (below) and pt reported 3-4/10 pain. 2x30s manual assist from PT hamstring stretch bil, 3x30s butterfly stretch  Bil lower trunk rotation x10 with hands on assist to complete within pt's tolerance of mobility unable to do one leg at a time with rotations and unable to do hip abduction/adduction in hooklying    PATIENT EDUCATION:  Education details: [place] educated: Patient and Child(ren) Education method: Explanation, Demonstration, Tactile cues, Verbal cues, and Handouts Education comprehension: verbalized  understanding and returned demonstration   HOME EXERCISE PROGRAM: Access Code: [email or web address] URL: [email or web address] Date: 11/08/2021 Prepared by: [facility]  Exercises - Supine Diaphragmatic Breathing  - 1 x daily - 7 x weekly - 1 sets - 10 reps - Unilateral Supported Supine Butterfly Stretch  - 1 x daily - 7 x weekly - 1 sets - 3 reps - 30s holds - Supine Lower Trunk Rotation  - 1 x daily - 7 x weekly - 1 sets - 3 reps - 30s holds  ASSESSMENT:  CLINICAL IMPRESSION: Patient reports today she  is having less crawling sensation and feels that her pain is getting better but does still have up to 10/10 pain, worse at night. MD gave new medication of Tramadol and pt this is helping too. Pt reports she has lower levels of pain at 4/10 is her best and this more often, pt does still have "worm" and reports this is stopping her from moving between her legs. PT did report to pt that she does have a lot of muscle and tissue restrictions in this region and pt reporting she feels that the worm is too tight here and this is what is stopping her. Pt session focused on manual work at hips and thighs with addaday and manual work for tissue release with hands on gentle stretching for improved mobility and decreased pain. Pt tolerated well today with more stretches for hip mobility and relaxation though pt is still very limited in mobility at hip joints, knees, and needs increased time to relax bil adductors for decreased clenching and overall tension. Pt would benefit from additional PT to further address deficits.     OBJECTIVE IMPAIRMENTS decreased activity tolerance, decreased coordination, decreased endurance, decreased mobility, difficulty walking, decreased strength, increased fascial restrictions, increased muscle spasms, impaired flexibility, improper body mechanics, postural dysfunction, and pain.   ACTIVITY LIMITATIONS carrying, lifting,  bending, sitting, standing, squatting, sleeping, stairs, transfers, bed mobility, bathing, toileting, and locomotion level  PARTICIPATION LIMITATIONS: cleaning, laundry, interpersonal relationship, shopping, community activity, and yard work  PERSONAL FACTORS Age, Past/current experiences, Time since onset of injury/illness/exacerbation, and 3+ comorbidities: complex medical history  are also affecting patient's functional outcome.   REHAB POTENTIAL: Good  CLINICAL DECISION MAKING: Evolving/moderate complexity  EVALUATION COMPLEXITY: Moderate   GOALS: Goals reviewed with patient? Yes  SHORT TERM GOALS: Target date: 12/06/2021  Pt to be I with HEP.  Baseline: Goal status: INITIAL  2.  Pt will report 25% reduction of pain due to improvements in posture, strength, and muscle length  Baseline:  Goal status: INITIAL  3.  Pt to report improved ability to fully void bowels and bladder at least 50% of the time due to improved mechanics and evacuation techniques.  Baseline:  Goal status: INITIAL    LONG TERM GOALS: Target date:  02/08/22    Pt to be I with advanced HEP.  Baseline:  Goal status: INITIAL  2.   Pt will report 50% reduction of pain due to improvements in posture, strength, and muscle length  Baseline:  Goal status: INITIAL  3.  Pt to report ability to sit without pain higher than 5/10 for at least 30 mins for improved QOL.  Baseline:  Goal status: INITIAL  4.  Pt demonstrate improved coordination of breathing and pelvic floor for improved pelvic relaxation for decreased pain for improved QOL at least 50% of the time.  Baseline:  Goal status: INITIAL    PLAN: PT FREQUENCY: 1x/week  PT DURATION:  8 sessions  PLANNED INTERVENTIONS: Therapeutic exercises, Therapeutic activity, Neuromuscular re-education, Patient/Family education, Self Care, Joint mobilization, Aquatic Therapy, Dry Needling, Spinal mobilization, Cryotherapy, Moist heat, Manual lymph drainage,  scar mobilization, Taping, Biofeedback, and Manual therapy  PLAN FOR NEXT SESSION: external pelvic floor mobility,  abdominal/hip/back manual and stretching    Stacy Gardner, PT, DPT 12/06/2309:44 AM

## 2021-12-14 ENCOUNTER — Ambulatory Visit: Payer: Commercial Managed Care - HMO | Admitting: Physical Therapy

## 2021-12-14 DIAGNOSIS — M62838 Other muscle spasm: Secondary | ICD-10-CM

## 2021-12-14 DIAGNOSIS — R293 Abnormal posture: Secondary | ICD-10-CM

## 2021-12-14 DIAGNOSIS — M6281 Muscle weakness (generalized): Secondary | ICD-10-CM | POA: Diagnosis not present

## 2021-12-14 DIAGNOSIS — R279 Unspecified lack of coordination: Secondary | ICD-10-CM

## 2021-12-14 DIAGNOSIS — R269 Unspecified abnormalities of gait and mobility: Secondary | ICD-10-CM

## 2021-12-14 NOTE — Therapy (Signed)
OUTPATIENT PHYSICAL THERAPY FEMALE PELVIC EVALUATION   Patient Name: Ellen Ryan MRN: [id number] DOB:May 11, 1955, 66 y.o., female Today's Date: 12/14/2021   PT End of Session - 12/14/21 1610     Visit Number 6    Date for PT Re-Evaluation 02/08/22    Authorization Type [employer]    PT Start Time 1535   pt arrival time   PT Stop Time 1613    PT Time Calculation (min) 38 min    Activity Tolerance Patient tolerated treatment well;Patient limited by pain    Behavior During Therapy [facility] for tasks assessed/performed              Past Medical History:  Diagnosis Date   Aortic valve stenosis    per echo 01-16-2017  very mild AV stenosis w/ valve area 1.85cm^2   Arthritis    both knees   Disc degeneration, lumbar    L4-L5   Grade I diastolic dysfunction 09/32/3557   Noted on ECHO   History of dysplasia of vulva    09-21-2017  VIN3   Hyperlipidemia    Hypertension    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[place])    extramammary-- s/p complete vulvectomy 09-21-2016   Type 2 diabetes mellitus ([place]) 12/27/2016   Uterine fibroid    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   Past Surgical History:  Procedure Laterality Date   CO2 LASER APPLICATION N/A 07/13/252   Procedure: CO2 LASER APPLICATION OF THE VULVA;  Surgeon: Everitt Amber, MD;  Location: [place];  Service: Gynecology;  Laterality: N/A;   CO2 LASER APPLICATION N/A 2/70/6237   Procedure: VAGINOSCOPY, VULVAR BIOPSIES;  Surgeon: Lafonda Mosses, MD;  Location: [facility];  Service: Gynecology;  Laterality: N/A;   EUA/ VULVA BIOPSY  09-01-2016  [name]  [place] N/A 09/21/2016   Procedure: RADICAL VULVECTOMY COMPLETE;  Surgeon: Everitt Amber, MD;  Location: [facility];  Service: Gynecology;  Laterality: N/A;   TRANSTHORACIC ECHOCARDIOGRAM  01/16/2017   ef 60-65%,  grade 1 diastolic dysfunction/  very mild AV stenosis with moderately thickened and calcified leaflets  (valve area 1.85cm^2)/  moderate LAE   VULVA /PERINEUM BIOPSY N/A 09/13/2017   Procedure: VULVAR BIOPSY;  Surgeon: Everitt Amber, MD;  Location: [facility];  Service: Gynecology;  Laterality: N/A;   VULVA Milagros Loll BIOPSY N/A 08/31/2021   Procedure: VULVAR BIOPSY;  Surgeon: Lahoma Crocker, MD;  Location: [facility];  Service: Gynecology;  Laterality: N/A;   Patient Active Problem List   Diagnosis Date Noted   Labial fusion 10/31/2021   Urinary frequency 12/27/2016   Dysuria 12/27/2016   Diabetes mellitus screening 12/27/2016   Cough 12/27/2016   Diabetes mellitus, new onset ([place]) 12/27/2016   Vulvar intraepithelial neoplasia (VIN) grade 3 10/04/2016   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[place]) 09/15/2016   Vaginal pain 05/17/2016   Essential hypertension 05/17/2016   Vaginal ulceration 04/06/2016    PCP: Jaquita Folds, MD  REFERRING PROVIDER: Jaquita Folds, MD  REFERRING DIAG: R10.2 (ICD-10-CM) - Pelvic pain  THERAPY DIAG:  Muscle weakness (generalized)  Other muscle spasm  Abnormal posture  Abnormality of gait and mobility  Unspecified lack of coordination  Rationale for Evaluation and Treatment Rehabilitation  ONSET DATE: 1 year  SUBJECTIVE:  SUBJECTIVE STATEMENT: "The massager you use has really helped me." Pt reports overall at home this last week she has had lower pain levels and only had one instance of feeling "the worm" crawling sensation at vaginal area. Pt reports her highest pain level this past week has been a 6/10 and lowest/best is 4/10. Pt did start session at 6/10 at vaginal/perineal area.    PAIN:  Are you having pain? Yes NPRS scale: 6/10 Pain location: Vaginal/ perineal area  Pain type: tight Pain description: constant but may  lower to 4/10 intermittently   Aggravating factors: sitting, walking, bending, standing prolonged, lying, washing  Relieving factors: medications   PRECAUTIONS: None  OCCUPATION: retired  PLOF: Independent  PATIENT GOALS to have less pain   PERTINENT HISTORY:  2018 for vaginal ulceration. This was biopsied in the OR and returned as extramammary pagets disease.   09/21/16, she underwent a complete simple vulvectomy.  08/2017 Intraoperative findings were significant for subtle acetowhite changes immediately lateral on the right side of the urethral meatus.  There also acetowhite changes to the midline distal vagina.  Vagina was completely agglutinated. 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  The posterior vaginal introitus biopsy revealed benign normal squamous mucosa. 07/2021 with sensation of "skin crawling". Had self treated for possible pinworms. Due to pain limiting exam, EUA and biopsies performed on 08/31/21. Bilateral biopsies were taken showing VIN3 with associated erosion and inflammation. No Paget's disease identified in these biopsies.  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, but only had removal of the clitoris and denies closure of labia Sexual abuse: No  BOWEL MOVEMENT Pain with bowel movement: Yes Type of bowel movement:Type (Bristol Stool Scale) 3-4, Frequency daily, and Strain Yes Fully empty rectum: Yes:   Leakage: No Pads: No Fiber supplement: No  URINATION Pain with urination: Yes Fully empty bladder: No Stream: Strong Urgency: Yes:   Frequency: 1-2 hours Leakage:  none Pads: No  INTERCOURSE Pain with intercourse:  not active Ability to have vaginal penetration:  No   PREGNANCY Vaginal deliveries 6 Tearing Yes: with 2 C-section deliveries 0 Currently pregnant No  PROLAPSE Heaviness/bulge sometimes worst in shower    OBJECTIVE:   DIAGNOSTIC FINDINGS:    COGNITION:  Overall cognitive status: Within functional limits for  tasks assessed     SENSATION:  Light touch: Deficits tingling in Lt fingers  Proprioception: Appears intact  MUSCLE LENGTH: Bil hamstrings and adductors limited by 75% but also has pain with mobility    GAIT: Distance walked: 30' Assistive device utilized: Single point cane Level of assistance: SBA Comments: decreased bil step height, poor cadence, forward head carriage, decreased bil arm swing, decreased stride length               POSTURE: rounded shoulders, forward head, and posterior pelvic tilt   LUMBARAROM/PROM  A/PROM A/PROM  eval  Flexion Limited by 75%  Extension Limited by 50%  Right lateral flexion Limited by 50%  Left lateral flexion Limited by 50%  Right rotation Limited by 50%  Left rotation Limited by 50%   (Blank rows = not tested)  LOWER EXTREMITY ROM:  Limited due to pain at bil LE by 25% grossly.   LOWER EXTREMITY MMT:  Bil hips 3/5 grossly; bil knees and ankles 4/5   PALPATION:   General  TTP at bil proximal adductors, bil lower abdominal quadrants with fascial restrictions, TTP at pubic bone  External Perineal Exam pt deferred today (eval)                             Internal Pelvic Floor pt deferred today (eval)  Patient confirms identification and approves PT to assess internal pelvic floor and treatment Yes but not today, plans for next session, grandson present during eval.   Pt unable to have internal exam due to anatomy of vaginal and labia. This is fused.   PELVIC MMT:   MMT eval  Vaginal   Internal Anal Sphincter   External Anal Sphincter   Puborectalis   Diastasis Recti   (Blank rows = not tested)        TONE: deferred  PROLAPSE: deferred  TODAY'S TREATMENT   12/14/21: Manual:  addaday used bil adductors, lower abdomen, supra pubic region, hip flexors and bil abductors, and Rt hamstring and glute with pt reporting she feels tightness most here and Rt adductors today. pain started at 6/10 and lowered to  4/10 after this. Manual hands on therapy also provided at Rt gluteal and proximal hamstring with noted tissue tension here however released with extra time and cues for relaxation and diaphragmatic breathing from pt. Manual hands on also completed at bil proximal adductors and hip flexors. Pt demonstrated greatly improved mobility of tissue compared to last session and pt states she has been doing HEP stretching within her range and though she feels stretch and some soreness during she does feel better and reports these have been getting a little easier for her.  Stretching with manual active assist also provided for bil hamstring stretching, hip IR, adductor stretch all in supine 3x30s X10 pelvic tilts in standing with ball for improved technique for pelvic mobility    12/06/2021 : Manual work: addaday used bil adductors, lower abdomen, supra pubic region, hip flexors and bil abductors, pain started at 6/10 and lowered to 4/10 after this. Tightness improved with addaday though still present, pt reported feeling better after this. Cues throughout for pelvic and global relaxation and for diaphragmatic breathing. Pt demonstrated improved mobility in bil hips today with improved butterfly stretch tolerance.  Stretching:  bil butterfly stretching 3x1 min today with intermittent manual for for soft tissue release as needed Manual assist for bil hamstring stretching 2x30s, hip IR and ER stretch 2x30s each, and bil hip flexor 2x30s    PATIENT EDUCATION:  Education details: [email or web address] Person educated: Patient Education method: Explanation, Demonstration, Tactile cues, Verbal cues, and Handouts Education comprehension: verbalized understanding and returned demonstration   HOME EXERCISE PROGRAM: Access Code: [email or web address] URL: [email or web address] Date: 11/08/2021 Prepared by: [facility]  Exercises - Supine Diaphragmatic Breathing  - 1 x daily - 7  x weekly - 1 sets - 10 reps - Unilateral Supported Supine Butterfly Stretch  - 1 x daily - 7 x weekly - 1 sets - 3 reps - 30s holds - Supine Lower Trunk Rotation  - 1 x daily - 7 x weekly - 1 sets - 3 reps - 30s holds  ASSESSMENT:  CLINICAL IMPRESSION: Patient states she has been feeling better overall, able to sit for a little longer with less pain (though still present and does limit pt but not as severely as prior to PT, per pt), and she is feeling the crawling worm like sensation much less often, reporting only once in the past week. Pt pleased with this and reports massage addaday has  helped the most to relax her muscles. This was used at start of treatment and manual work also completed at proximal pelvic region and bil hips for improved tissue mobility and decreased pain. Pt does continue to be limited with flexibility at back, hips, knees and this does limit mobility and does have pain at her end range but no more than 6/10 now instead of intolerable. pt  continues to progress slightly with mobility and decreasing pain levels with mobility but still has these deficits and would benefit from additional PT to further address deficits.  Pt also still not tolerating treatment in sitting, only standing or supine/hooklying.    OBJECTIVE IMPAIRMENTS decreased activity tolerance, decreased coordination, decreased endurance, decreased mobility, difficulty walking, decreased strength, increased fascial restrictions, increased muscle spasms, impaired flexibility, improper body mechanics, postural dysfunction, and pain.   ACTIVITY LIMITATIONS carrying, lifting, bending, sitting, standing, squatting, sleeping, stairs, transfers, bed mobility, bathing, toileting, and locomotion level  PARTICIPATION LIMITATIONS: cleaning, laundry, interpersonal relationship, shopping, community activity, and yard work  PERSONAL FACTORS Age, Past/current experiences, Time since onset of injury/illness/exacerbation, and 3+  comorbidities: complex medical history  are also affecting patient's functional outcome.   REHAB POTENTIAL: Good  CLINICAL DECISION MAKING: Evolving/moderate complexity  EVALUATION COMPLEXITY: Moderate   GOALS: Goals reviewed with patient? Yes  SHORT TERM GOALS: Target date: 12/06/2021  Pt to be I with HEP.  Baseline: Goal status: INITIAL  2.  Pt will report 25% reduction of pain due to improvements in posture, strength, and muscle length  Baseline:  Goal status: INITIAL  3.  Pt to report improved ability to fully void bowels and bladder at least 50% of the time due to improved mechanics and evacuation techniques.  Baseline:  Goal status: INITIAL    LONG TERM GOALS: Target date:  02/08/22    Pt to be I with advanced HEP.  Baseline:  Goal status: INITIAL  2.   Pt will report 50% reduction of pain due to improvements in posture, strength, and muscle length  Baseline:  Goal status: INITIAL  3.  Pt to report ability to sit without pain higher than 5/10 for at least 30 mins for improved QOL.  Baseline:  Goal status: INITIAL  4.  Pt demonstrate improved coordination of breathing and pelvic floor for improved pelvic relaxation for decreased pain for improved QOL at least 50% of the time.  Baseline:  Goal status: INITIAL    PLAN: PT FREQUENCY: 1x/week  PT DURATION:  8 sessions  PLANNED INTERVENTIONS: Therapeutic exercises, Therapeutic activity, Neuromuscular re-education, Patient/Family education, Self Care, Joint mobilization, Aquatic Therapy, Dry Needling, Spinal mobilization, Cryotherapy, Moist heat, Manual lymph drainage, scar mobilization, Taping, Biofeedback, and Manual therapy  PLAN FOR NEXT SESSION: external pelvic floor mobility,  abdominal/hip/back manual and stretching   Stacy Gardner, PT, DPT 08/23/234:21 PM

## 2021-12-19 ENCOUNTER — Ambulatory Visit: Payer: Commercial Managed Care - HMO | Admitting: Behavioral Health

## 2021-12-19 ENCOUNTER — Ambulatory Visit: Payer: Commercial Managed Care - HMO | Admitting: Physical Therapy

## 2021-12-19 DIAGNOSIS — M6281 Muscle weakness (generalized): Secondary | ICD-10-CM

## 2021-12-19 DIAGNOSIS — R279 Unspecified lack of coordination: Secondary | ICD-10-CM

## 2021-12-19 DIAGNOSIS — R269 Unspecified abnormalities of gait and mobility: Secondary | ICD-10-CM

## 2021-12-19 DIAGNOSIS — M62838 Other muscle spasm: Secondary | ICD-10-CM

## 2021-12-19 DIAGNOSIS — R293 Abnormal posture: Secondary | ICD-10-CM

## 2021-12-19 NOTE — Therapy (Addendum)
OUTPATIENT PHYSICAL THERAPY FEMALE PELVIC TREATMENT   Patient Name: Ellen Ryan MRN: [id number] DOB:12-Jul-1955, 66 y.o., female Today's Date: 12/19/2021   PT End of Session - 12/19/21 0824     Visit Number 7    Date for PT Re-Evaluation 02/08/22    Authorization Type [employer]    PT Start Time [phone]   pt arrival   PT Stop Time 0844    PT Time Calculation (min) 33 min    Activity Tolerance Patient tolerated treatment well;Patient limited by pain    Behavior During Therapy [facility] for tasks assessed/performed              Past Medical History:  Diagnosis Date   Aortic valve stenosis    per echo 01-16-2017  very mild AV stenosis w/ valve area 1.85cm^2   Arthritis    both knees   Disc degeneration, lumbar    L4-L5   Grade I diastolic dysfunction 22/63/3354   Noted on ECHO   History of dysplasia of vulva    09-21-2017  VIN3   Hyperlipidemia    Hypertension    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[place])    extramammary-- s/p complete vulvectomy 09-21-2016   Type 2 diabetes mellitus ([place]) 12/27/2016   Uterine fibroid    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   Past Surgical History:  Procedure Laterality Date   CO2 LASER APPLICATION N/A 5/62/5638   Procedure: CO2 LASER APPLICATION OF THE VULVA;  Surgeon: Everitt Amber, MD;  Location: [place];  Service: Gynecology;  Laterality: N/A;   CO2 LASER APPLICATION N/A 9/37/3428   Procedure: VAGINOSCOPY, VULVAR BIOPSIES;  Surgeon: Lafonda Mosses, MD;  Location: [facility];  Service: Gynecology;  Laterality: N/A;   EUA/ VULVA BIOPSY  09-01-2016  [name]  [place] N/A 09/21/2016   Procedure: RADICAL VULVECTOMY COMPLETE;  Surgeon: Everitt Amber, MD;  Location: [facility];  Service: Gynecology;  Laterality: N/A;   TRANSTHORACIC ECHOCARDIOGRAM  01/16/2017   ef 60-65%,  grade 1 diastolic dysfunction/  very mild AV stenosis with moderately thickened and calcified leaflets (valve  area 1.85cm^2)/  moderate LAE   VULVA /PERINEUM BIOPSY N/A 09/13/2017   Procedure: VULVAR BIOPSY;  Surgeon: Everitt Amber, MD;  Location: [facility];  Service: Gynecology;  Laterality: N/A;   VULVA Milagros Loll BIOPSY N/A 08/31/2021   Procedure: VULVAR BIOPSY;  Surgeon: Lahoma Crocker, MD;  Location: [facility];  Service: Gynecology;  Laterality: N/A;   Patient Active Problem List   Diagnosis Date Noted   Labial fusion 10/31/2021   Urinary frequency 12/27/2016   Dysuria 12/27/2016   Diabetes mellitus screening 12/27/2016   Cough 12/27/2016   Diabetes mellitus, new onset ([place]) 12/27/2016   Vulvar intraepithelial neoplasia (VIN) grade 3 10/04/2016   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 I can sit up to one hour now and have less pain with cleaning and dishes". Pt reports her pain has not been higher than 6/10, best 3/10. Pt reports no longer having crawling sensation and does have "biting" feeling but this is only with tightness.    PAIN:  Are you having pain? Yes NPRS scale: 6/10 at start of session Pain location: Vaginal/ perineal area  Pain type: tight Pain description: constant but may lower to 4/10 intermittently   Aggravating factors:  sitting, walking, bending, standing prolonged, lying, washing  Relieving factors: medications   PRECAUTIONS: None  OCCUPATION: retired  PLOF: Independent  PATIENT GOALS to have less pain   PERTINENT HISTORY:  2018 for vaginal ulceration. This was biopsied in the OR and returned as extramammary pagets disease.   09/21/16, she underwent a complete simple vulvectomy.  08/2017 Intraoperative findings were significant for subtle acetowhite changes immediately lateral on the right side of the urethral meatus.  There also acetowhite changes to the midline distal vagina.  Vagina was completely agglutinated. 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  The posterior vaginal introitus biopsy revealed benign normal squamous mucosa. 07/2021 with sensation of "skin crawling". Had self treated for possible pinworms. Due to pain limiting exam, EUA and biopsies performed on 08/31/21. Bilateral biopsies were taken showing VIN3 with associated erosion and inflammation. No Paget's disease identified in these biopsies.  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, but only had removal of the clitoris and denies closure of labia Sexual abuse: No  BOWEL MOVEMENT Pain with bowel movement: Yes Type of bowel movement:Type (Bristol Stool Scale) 3-4, Frequency daily, and Strain Yes Fully empty rectum: Yes:   Leakage: No Pads: No Fiber supplement: No  URINATION Pain with urination: Yes Fully empty bladder: No Stream: Strong Urgency: Yes:   Frequency: 1-2 hours Leakage:  none Pads: No  INTERCOURSE Pain with intercourse:  not active Ability to have vaginal penetration:  No   PREGNANCY Vaginal deliveries 6 Tearing Yes: with 2 C-section deliveries 0 Currently pregnant No  PROLAPSE Heaviness/bulge sometimes worst in shower    OBJECTIVE:   DIAGNOSTIC FINDINGS:    COGNITION:  Overall cognitive status: Within functional limits for tasks assessed     SENSATION:  Light touch:  Deficits tingling in Lt fingers  Proprioception: Appears intact  MUSCLE LENGTH: Bil hamstrings and adductors limited by 75% but also has pain with mobility    GAIT: Distance walked: 30' Assistive device utilized: Single point cane Level of assistance: SBA Comments: decreased bil step height, poor cadence, forward head carriage, decreased bil arm swing, decreased stride length               POSTURE: rounded shoulders, forward head, and posterior pelvic tilt   LUMBARAROM/PROM  A/PROM A/PROM  eval  Flexion Limited by 75%  Extension Limited by 50%  Right lateral flexion Limited by 50%  Left lateral flexion Limited by 50%  Right rotation Limited by 50%  Left rotation Limited by 50%   (Blank rows = not tested)  LOWER EXTREMITY ROM:  Limited due to pain at bil LE by 25% grossly.   LOWER EXTREMITY MMT:  Bil hips 3/5 grossly; bil knees and ankles 4/5   PALPATION:   General  TTP at bil proximal adductors, bil lower abdominal quadrants with fascial restrictions, TTP at pubic bone  External Perineal Exam pt deferred today (eval)                             Internal Pelvic Floor pt deferred today (eval)  Patient confirms identification and approves PT to assess internal pelvic floor and treatment Yes but not today, plans for next session, grandson present during eval.   Pt unable to have internal exam due to anatomy of vaginal and labia. This is fused.   PELVIC MMT:   MMT eval  Vaginal   Internal Anal Sphincter   External Anal Sphincter   Puborectalis   Diastasis Recti   (Blank rows = not tested)        TONE: deferred  PROLAPSE: deferred  TODAY'S TREATMENT   12/19/2021: Manual:  addaday used bil adductors, lower abdomen, supra pubic region, hip flexors and bil abductors, and bil hamstring Manual: Active assist for bil hamstring stretching 4x30s, bil adduction stretching 4x30s, single knee to chest to pt's range tolerance 4x30s each, hip IR rotation  to neutral stretch in supine bil 4x30s Seated x10 hip flexion each, Seated x10 hip adduction/abduction each (pt reports she feels some tightness at pelvic region but most pain comes for just sitting not exercises, though pt is tolerating more sitting this is on softer surface)  12/14/21: Manual:  addaday used bil adductors, lower abdomen, supra pubic region, hip flexors and bil abductors, and Rt hamstring and glute with pt reporting she feels tightness most here and Rt adductors today. pain started at 6/10 and lowered to 4/10 after this. Manual hands on therapy also provided at Rt gluteal and proximal hamstring with noted tissue tension here however released with extra time and cues for relaxation and diaphragmatic breathing from pt. Manual hands on also completed at bil proximal adductors and hip flexors. Pt demonstrated greatly improved mobility of tissue compared to last session and pt states she has been doing HEP stretching within her range and though she feels stretch and some soreness during she does feel better and reports these have been getting a little easier for her.  Stretching with manual active assist also provided for bil hamstring stretching, hip IR, adductor stretch all in supine 3x30s X10 pelvic tilts in standing with ball for improved technique for pelvic mobility    PATIENT EDUCATION:  Education details: [place] educated: Patient Education method: Explanation, Demonstration, Tactile cues, Verbal cues, and Handouts Education comprehension: verbalized understanding and returned demonstration   HOME EXERCISE PROGRAM: Access Code: [email or web address] URL: [email or web address] Date: 11/08/2021 Prepared by: [facility]  Exercises - Supine Diaphragmatic Breathing  - 1 x daily - 7 x weekly - 1 sets - 10 reps - Unilateral Supported Supine Butterfly Stretch  - 1 x daily - 7 x weekly - 1 sets - 3 reps - 30s holds - Supine  Lower Trunk Rotation  - 1 x daily - 7 x weekly - 1 sets - 3 reps - 30s holds  ASSESSMENT:  CLINICAL IMPRESSION: Patient reports she is having better pain overall and can tell a difference when doing household chores and can sit up to one hour at home. Pt reports her best pain is as low as 3/10 now and highest is ever 6/10 but this is not with all activity and lower is frequency as before. Pt session focused on manual at hips and pelvic regions with pt requiring active assist for all stretching due to limited range  of active mobility. Pt is improving in tolerance to this and has much less pain with all stretching during session. Pt session progressed with attempting sitting exercises today, pt limited by pain but not higher than 5/10 per pt for short period of time. Pt would benefit from additional PT to further address deficits.     OBJECTIVE IMPAIRMENTS decreased activity tolerance, decreased coordination, decreased endurance, decreased mobility, difficulty walking, decreased strength, increased fascial restrictions, increased muscle spasms, impaired flexibility, improper body mechanics, postural dysfunction, and pain.   ACTIVITY LIMITATIONS carrying, lifting, bending, sitting, standing, squatting, sleeping, stairs, transfers, bed mobility, bathing, toileting, and locomotion level  PARTICIPATION LIMITATIONS: cleaning, laundry, interpersonal relationship, shopping, community activity, and yard work  PERSONAL FACTORS Age, Past/current experiences, Time since onset of injury/illness/exacerbation, and 3+ comorbidities: complex medical history  are also affecting patient's functional outcome.   REHAB POTENTIAL: Good  CLINICAL DECISION MAKING: Evolving/moderate complexity  EVALUATION COMPLEXITY: Moderate   GOALS: Goals reviewed with patient? Yes  SHORT TERM GOALS: Target date: 12/06/2021  Pt to be I with HEP.  Baseline: Goal status: INITIAL  2.  Pt will report 25% reduction of pain due to  improvements in posture, strength, and muscle length  Baseline:  Goal status: INITIAL  3.  Pt to report improved ability to fully void bowels and bladder at least 50% of the time due to improved mechanics and evacuation techniques.  Baseline:  Goal status: INITIAL    LONG TERM GOALS: Target date:  02/08/22    Pt to be I with advanced HEP.  Baseline:  Goal status: INITIAL  2.   Pt will report 50% reduction of pain due to improvements in posture, strength, and muscle length  Baseline:  Goal status: INITIAL  3.  Pt to report ability to sit without pain higher than 5/10 for at least 30 mins for improved QOL.  Baseline:  Goal status: INITIAL  4.  Pt demonstrate improved coordination of breathing and pelvic floor for improved pelvic relaxation for decreased pain for improved QOL at least 50% of the time.  Baseline:  Goal status: INITIAL    PLAN: PT FREQUENCY: 1x/week  PT DURATION:  8 sessions  PLANNED INTERVENTIONS: Therapeutic exercises, Therapeutic activity, Neuromuscular re-education, Patient/Family education, Self Care, Joint mobilization, Aquatic Therapy, Dry Needling, Spinal mobilization, Cryotherapy, Moist heat, Manual lymph drainage, scar mobilization, Taping, Biofeedback, and Manual therapy  PLAN FOR NEXT SESSION: external pelvic floor mobility,  abdominal/hip/back manual and stretching   Stacy Gardner, PT, DPT 08/28/238:46 AM   PHYSICAL THERAPY DISCHARGE SUMMARY  Visits from Start of Care: 7  Current functional level related to goals / functional outcomes: Unable to formally reassess as pt has not returned since last visit   Remaining deficits: Unable to formally reassess    Education / Equipment: HEP   Patient agrees to discharge. Patient goals were partially met. Patient is being discharged due to not returning since the last visit.  Stacy Gardner, PT, DPT 04/12/2310:13 AM

## 2021-12-19 NOTE — Progress Notes (Unsigned)
                Eirene Rather L Addis Tuohy, LMFT 

## 2021-12-20 ENCOUNTER — Ambulatory Visit: Payer: 59 | Admitting: Obstetrics and Gynecology

## 2021-12-20 NOTE — Progress Notes (Deleted)
[facility] Return Visit  SUBJECTIVE  History of Present Illness: Ellen Ryan is a 66 y.o. female seen in follow-up for pelvic discomfort/ levator spasm. Plan at last visit was to start flexeril 21m TID prn in addition to the gabapentin she is taking. She also started pelvic physical therapy.     Past Medical History: Patient  has a past medical history of Aortic valve stenosis, Arthritis, Disc degeneration, lumbar, Grade I diastolic dysfunction (011/15/5208, History of dysplasia of vulva, Hyperlipidemia, Hypertension, 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, [place] 2 diabetes mellitus [place] (12/27/2016), Uterine fibroid, and VIN III (vulvar intraepithelial neoplasia III).   Past Surgical History: She  has a past surgical history that includes Radical vulvectomy (N/A, 09/21/2016); Vulva / perineum biopsy (N/A, 09/13/2017); EU[facility] transthoracic [facility]ogram (01/16/2017); Co2 laser application (N/A, 60/22/3361; Vulva / perineum biopsy (N/A, 08/31/2021); and Co2 laser application (N/A, 52/24/4975.   Medications: She has a current medication list which includes the following prescription(s): acetaminophen, amlodipine, blood glucose meter kit and supplies, true metrix meter, cyclobenzaprine, gabapentin, freestyle lite, ibuprofen, imiquimod, freestyle, melatonin, metformin, multivitamin with minerals, fish oil, polyethylene glycol, and senna.   Allergies: Patient is allergic to oxycodone.   Social History: Patient  reports that she has never smoked. She has never used smokeless tobacco. She reports that she does not currently use alcohol. She reports that she does not use drugs.      OBJECTIVE     Physical Exam: There were no vitals filed for this visit. Gen: No apparent distress, A&O x 3.  Detailed Urogynecologic Evaluation:  Deferred. Prior exam showed:      No data to display             ASSESSMENT AND PLAN    Ms. MGingis a 66y.o.  with:  No diagnosis found.

## 2021-12-21 ENCOUNTER — Other Ambulatory Visit: Payer: Self-pay | Admitting: Physician Assistant

## 2021-12-21 ENCOUNTER — Encounter: Payer: Self-pay | Admitting: Physician Assistant

## 2021-12-21 MED ORDER — TRAMADOL HCL 50 MG PO TABS: 50.0000 mg | ORAL_TABLET | Freq: Every evening | ORAL | 0 refills | Status: DC | PRN

## 2021-12-21 NOTE — Telephone Encounter (Signed)
Please see note and advise  

## 2021-12-23 ENCOUNTER — Telehealth: Payer: Self-pay | Admitting: Physician Assistant

## 2021-12-23 NOTE — Telephone Encounter (Signed)
   LAST APPOINTMENT DATE:  11/23/21 OV with PCP  NEXT APPOINTMENT DATE: N/A  MEDICATION: "Atorovastatin"  amLODipine (NORVASC) 10 MG tablet [[id number]]   metFORMIN (GLUCOPHAGE) 500 MG tablet [[id number]]   Is the patient out of medication?  Yes, first missed dosse 12/23/21   PHARMACY: [facility] [id number] - [place], [place]  [address] [place], [place] [address]  Phone:  [phone]  Fax:  [phone]

## 2021-12-27 ENCOUNTER — Ambulatory Visit: Payer: 59 | Attending: Obstetrics and Gynecology | Admitting: Physical Therapy

## 2021-12-27 ENCOUNTER — Other Ambulatory Visit: Payer: Self-pay

## 2021-12-27 DIAGNOSIS — E119 Type 2 diabetes mellitus without complications: Secondary | ICD-10-CM

## 2021-12-27 DIAGNOSIS — M62838 Other muscle spasm: Secondary | ICD-10-CM | POA: Insufficient documentation

## 2021-12-27 DIAGNOSIS — R269 Unspecified abnormalities of gait and mobility: Secondary | ICD-10-CM | POA: Insufficient documentation

## 2021-12-27 DIAGNOSIS — R279 Unspecified lack of coordination: Secondary | ICD-10-CM | POA: Insufficient documentation

## 2021-12-27 DIAGNOSIS — M6281 Muscle weakness (generalized): Secondary | ICD-10-CM | POA: Insufficient documentation

## 2021-12-27 DIAGNOSIS — R293 Abnormal posture: Secondary | ICD-10-CM | POA: Insufficient documentation

## 2021-12-27 DIAGNOSIS — I10 Essential (primary) hypertension: Secondary | ICD-10-CM

## 2021-12-27 MED ORDER — AMLODIPINE BESYLATE 10 MG PO TABS: 10.0000 mg | ORAL_TABLET | Freq: Every day | ORAL | 3 refills | Status: DC

## 2021-12-27 MED ORDER — METFORMIN HCL 500 MG PO TABS: 500.0000 mg | ORAL_TABLET | Freq: Two times a day (BID) | ORAL | 3 refills | Status: DC

## 2021-12-27 NOTE — Telephone Encounter (Signed)
Rx sent to pt pharmacy per provider recommendations

## 2022-01-02 ENCOUNTER — Ambulatory Visit: Payer: 59 | Admitting: Physical Therapy

## 2022-01-12 ENCOUNTER — Other Ambulatory Visit: Payer: Self-pay | Admitting: Physician Assistant

## 2022-01-16 ENCOUNTER — Encounter: Payer: Self-pay | Admitting: *Deleted

## 2022-02-13 ENCOUNTER — Encounter: Payer: Self-pay | Admitting: *Deleted

## 2022-04-06 ENCOUNTER — Encounter: Payer: Self-pay | Admitting: *Deleted

## 2022-04-14 ENCOUNTER — Other Ambulatory Visit: Payer: Self-pay | Admitting: Physician Assistant

## 2022-04-14 DIAGNOSIS — E119 Type 2 diabetes mellitus without complications: Secondary | ICD-10-CM

## 2022-04-25 NOTE — Progress Notes (Signed)
Chief Complaint  Patient presents with   Establish Care    Establish care. She has had extreme vaginal pain x 6 months to where she cannot even sit down. She feels like she has a hot stone in her vagina. Sometimes she cannot urinate.    Patient presents to establish care.  She is accompanied by her daughter Loyal Gambler, who is my patient, and wanted her mother seen by me for my opinion on her current ongoing complaints.  She last saw her PCP in August 2023. At that time she reported a "constant crawling sensation" in the vaginal area, ongoing since 05/2021, which she had also been discussing with her urogyn.  She has h/o recurrent VIN III.  Gabapentin and flexeril didn't help.  She had been prescribed tramadol. Rx'd #30 ultram 12/22/21.   See below for further discussion/review of uroGYN notes. She has seen uro-gyn and gyn-onc, both last seen in July.  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 She underwent a complete simple vulvectomy in 08/2016, and final pathology revealed VIN 3 with small foci of pagets. No invasive carcinoma, negative margins. This past Spring she developed "skin crawling" sensation. She underwent additional biopsies May 2023, and these showed VIN3 (no Paget's). She had fusion of labia, uroGYN suspected her abnl sensations was related to urine being trapped under the fused labia. Tried suprapubic cath, dislodged a few days later. Using topical lidocaine with some relief on her vulva.  She last saw GYN-Onc, Dr. Berline Lopes, in 10/2021. At that time they had long discussion of surgical options, and decided to use topical therapy using imiquimod (3 times a week, but starting with once or twice weekly) to the area on the right side of her vulva where she has pain, until surgical decision is made. She was referred to Dr. [name] (uro-gyn). She didn't think surgery would be the best treatment. She recommended pelvic floor PT, muscle relaxants, pelvic  muscle trigger point injections or centrally acting pain medications.  Referral placed to pelvic physical therapy. Prescribed flexeril 31m TID prn.  Considered possible pudendal nerve involvement, to consider nerve block.  Pt states she used the imiquimod until she finished it. Unsure how long she took it. Didn't feel the same then, feels worse now. Lidocaine was helpful, also hasn't used in quite a while. Has been on gabapentin for a while, not helping. Only went to pelvic floor PT once.  Today she reports that she is up 6x at night to void, doesn't sleep well,  tired during the day. Back when she saw the specialists, she reported it wasn't "moving around"--now it feels like there is moving, "biting", can't sit down due to discomfort. Something feels like it is crawling around. Feels like a penis. Painful to walk, and to sit. Feels like something pulls, like seizure, spasm, "heavy"  She reports that urine isn't coming out, only small amounts. Uses pads daily, has leakage. Denies abdominal pain.  Diabetes: She reports compliance with metformin 5035mBID, and denies side effects. Sugars are running 120-130.  Lab Results  Component Value Date   HGBA1C 6.2 11/23/2021   She had normal monofilament exam done at her last PCP visit in 11/2021.  She is taking atorvastatin 4073mdenies side effects. Lab Results  Component Value Date   CHOL 118 11/23/2021   HDL 38.60 (L) 11/23/2021   LDLCALC 52 11/23/2021   TRIG 135.0 11/23/2021   CHOLHDL 3 11/23/2021   Elevated LFT's were noted on August labs.  RUQ [place] was recommended. This was never done. She had negative HepC testing at that time. Lab Results  Component Value Date   ALT 51 (H) 11/23/2021   AST 39 (H) 11/23/2021   ALKPHOS 78 11/23/2021   BILITOT 0.5 11/23/2021    HTN:  She is on amlodipine. Was never on ACEI or ARB. She denies headaches, dizziness, chest pain, palpitations, shortness of breath, edema.  PMH, PSH, SH and FH in  chart were all reviewed.  Immunization History  Administered Date(s) Administered   PFIZER(Purple Top)SARS-COV-2 Vaccination 06/05/2019, 06/30/2019, 06/27/2020   Pneumococcal Polysaccharide-23 09/30/2020   Tdap 11/23/2021   Zoster Recombinat (Shingrix) 09/30/2020, 11/23/2021    Outpatient Encounter Medications as of 04/27/2022  Medication Sig Note   amLODipine (NORVASC) 10 MG tablet Take 1 tablet (10 mg total) by mouth daily.    atorvastatin (LIPITOR) 40 MG tablet Take 1 tablet by mouth daily.    blood glucose meter kit and supplies KIT Dispense based on patient and insurance preference. Use up to four times daily as directed.    Blood Glucose Monitoring Suppl (TRUE METRIX METER) w/Device KIT Use as directed    gabapentin (NEURONTIN) 300 MG capsule TAKE ONE CAPSULE BY MOUTH THREE TIMES A DAY    glucose blood (FREESTYLE LITE) test strip USE ONE STRIP TO TEST FOUR TIMES A DAY    ibuprofen (ADVIL,MOTRIN) 200 MG tablet Take 400 mg by mouth as needed. Reminded to stop 48 hours prior to surgery 04/27/2022: Took at 10am today   Lancets (FREESTYLE) lancets USE ONE LANCET TO TEST FOUR TIMES A DAY    lidocaine (XYLOCAINE) 5 % ointment Apply 1 Application topically as needed. Apply to vulva as needed for pain    metFORMIN (GLUCOPHAGE) 500 MG tablet TAKE 1 TABLET BY MOUTH TWICE A DAY WITH A MEAL 04/27/2022: Taking 1055m BID   Multiple Vitamin (MULTIVITAMIN WITH MINERALS) TABS tablet Take 1 tablet by mouth daily. Centrum    nystatin (MYCOSTATIN/NYSTOP) powder Apply 1 Application topically 3 (three) times daily.    [DISCONTINUED] Omega-3 Fatty Acids (FISH OIL) 1000 MG CAPS Take by mouth.    acetaminophen (TYLENOL) 325 MG tablet Take 650 mg by mouth every 6 (six) hours as needed. (Patient not taking: Reported on 04/27/2022) 04/27/2022: prn   imiquimod (ALDARA) 5 % cream Apply topically 3 (three) times a week. (Patient not taking: Reported on 04/27/2022)    MELATONIN PO Take by mouth. (Patient not taking: Reported  on 04/27/2022) 04/27/2022: prn   polyethylene glycol (MIRALAX / GLYCOLAX) packet Take 17 g by mouth daily as needed. As needed (Patient not taking: Reported on 04/27/2022) 04/27/2022: prn   senna (SENOKOT) 8.6 MG TABS tablet Take 1 tablet (8.6 mg total) by mouth at bedtime. (Patient not taking: Reported on 04/27/2022) 04/27/2022: prn   [DISCONTINUED] cyclobenzaprine (FLEXERIL) 5 MG tablet Take 1 tablet (5 mg total) by mouth 3 (three) times daily as needed for muscle spasms.    [DISCONTINUED] traMADol (ULTRAM) 50 MG tablet Take 1 tablet (50 mg total) by mouth at bedtime as needed.    No facility-administered encounter medications on file as of 04/27/2022.   Not using lidocaine or nystatin powder prior to today's visit  Allergies  Allergen Reactions   Oxycodone Itching   ROS: no fever, chills, URI symptoms, chest pain, shortness of breath. Denies bleeding, rash. Urinary issues, pelvic and vaginal pain per HPI.   PHYSICAL EXAM:  BP 110/74   Pulse 88   Ht _0  (1.473 m)  Wt 162 lb 12.8 oz (73.8 kg)   BMI 34.03 kg/m   Wt Readings from Last 3 Encounters:  04/27/22 162 lb 12.8 oz (73.8 kg)  11/23/21 172 lb (78 kg)  10/31/21 168 lb (76.2 kg)   Patient is in significant discomfort during visit--reclined slightly in chair was most tolerable position. HEENT: conjunctiva and sclera are clear, EOMI. Neck: no lymphadenopathy or mass Heart: regular rate and rhythm Lungs: clear bilaterally Back: no spinal or CVA tenderness Abdomen: protuberant, but soft (pt states unchanged, known fibroid uterus). Nontender in suprapubic area. Had a VERY difficult time getting her positioned for genital exam. Has labial fusion, with an opening anteriorly.  Posterior was difficult to visualize, as she was so tender to touch.  Able to see that the area posterior to the fused labia, when able to raise it--bright red tissue, raw, very tender to palpation.   Extremities: no edema Psych: frustrated, in pain, anxious. Normal  eye contact, speech, hygiene and grooming Neuro: alert and oriented, cranial nerves grossly intact.  Gait affected by her pain in vulvar/perineal area.  Urine dip: SG 1.010, large blood, +prot, 2+ leuks, negative nitrite.   ASSESSMENT/PLAN:  Perineal pain in female - tissue is raw, contributing to pain. At risk for yeast (DM, constantly wet from urine)--Nystatin powder, barrier rec. Needs to see urologist or f/u with urogyn - Plan: Ambulatory referral to Urology, CBC with Differential/Platelet, nystatin (MYCOSTATIN/NYSTOP) powder, lidocaine (XYLOCAINE) 5 % ointment, POCT Urinalysis DIP (Proadvantage Device)  VIN III (vulvar intraepithelial neoplasia III) - needs to f/u with gyn-onc - Plan: Ambulatory referral to Urology, POCT Urinalysis DIP (Proadvantage Device)  Labial adhesions - Plan: Ambulatory referral to Urology, POCT Urinalysis DIP (Proadvantage Device)  Bladder distended - unsure if bladder emptying well.  Abdomen is distended, but known fibroids.  Rec eval with urology, and PVR. Likely needs suprapubic cath - Plan: Ambulatory referral to Urology, POCT Urinalysis DIP (Proadvantage Device)  Cystitis - abnormal urine dip, poss contaminated. Will send for culture and cover with ABX - Plan: Urine Culture, nitrofurantoin, macrocrystal-monohydrate, (MACROBID) 100 MG capsule  Type 2 diabetes mellitus with other specified complication, without long-term current use of insulin ([place]) - controlled on current meds - Plan: Comprehensive metabolic panel  Hypertension associated with diabetes ([place]) - controlled.  Not on ARB--need to check urine microalb to see if meds should be changed  Vaccine counseling - recommendations for flu/covid/RSV, prevnar-20 discussed, all declined.  Inadequately emptying bladder/abnl voiding related to labial adhesions.  Likely contributing to skin irritation and pain.  Needs to see urologist (to check PVR, poss catheter) Her report of heaviness, which was worse when  she stood and removed underwear--?if underlying prolapse (could the cervix or urethra be what she is feeling (referred to as feeling like a penis), above the adhered labia?  At the time of visit--I missed that she had seen uro-gyn (shortly after seeing gyn-onc), so put in a stat referral to urology (they were fine with another opinion).  I will forward copy of note to Dr. [name] as well, in case she is able to get her in sooner, as ultimately uro-gyn is likely most appropriate for this patient.   Unfortunately, urine microalbumin not done today (also, if infection, may not have been accurate). Needs to be checked in future, and if abnormal, have BP meds adjusted, to include ACEI or ARB.  I spent >75 minutes dedicated to the care of this patient, including pre-visit review of records (15 min), face to face time (  50 min), post-visit ordering of testing and documentation.

## 2022-04-27 ENCOUNTER — Ambulatory Visit (INDEPENDENT_AMBULATORY_CARE_PROVIDER_SITE_OTHER): Payer: Commercial Managed Care - HMO | Admitting: Family Medicine

## 2022-04-27 ENCOUNTER — Encounter: Payer: Self-pay | Admitting: Family Medicine

## 2022-04-27 VITALS — BP 110/74 | HR 88 | Ht <= 58 in | Wt 162.8 lb

## 2022-04-27 DIAGNOSIS — N3289 Other specified disorders of bladder: Secondary | ICD-10-CM | POA: Diagnosis not present

## 2022-04-27 DIAGNOSIS — D071 Carcinoma in situ of vulva: Secondary | ICD-10-CM | POA: Diagnosis not present

## 2022-04-27 DIAGNOSIS — N9089 Other specified noninflammatory disorders of vulva and perineum: Secondary | ICD-10-CM | POA: Diagnosis not present

## 2022-04-27 DIAGNOSIS — Z7185 Encounter for immunization safety counseling: Secondary | ICD-10-CM

## 2022-04-27 DIAGNOSIS — N3090 Cystitis, unspecified without hematuria: Secondary | ICD-10-CM

## 2022-04-27 DIAGNOSIS — R102 Pelvic and perineal pain: Secondary | ICD-10-CM

## 2022-04-27 DIAGNOSIS — I152 Hypertension secondary to endocrine disorders: Secondary | ICD-10-CM

## 2022-04-27 DIAGNOSIS — E1169 Type 2 diabetes mellitus with other specified complication: Secondary | ICD-10-CM

## 2022-04-27 DIAGNOSIS — E1159 Type 2 diabetes mellitus with other circulatory complications: Secondary | ICD-10-CM

## 2022-04-27 LAB — POCT URINALYSIS DIP (PROADVANTAGE DEVICE)
Bilirubin, UA: NEGATIVE
Glucose, UA: NEGATIVE mg/dL
Ketones, POC UA: NEGATIVE mg/dL
Nitrite, UA: NEGATIVE
Protein Ur, POC: 30 mg/dL — AB
Specific Gravity, Urine: 1.01
Urobilinogen, Ur: 0.2
pH, UA: 7.5

## 2022-04-27 MED ORDER — NITROFURANTOIN MONOHYD MACRO 100 MG PO CAPS: 100.0000 mg | ORAL_CAPSULE | Freq: Two times a day (BID) | ORAL | 0 refills | Status: DC

## 2022-04-27 MED ORDER — LIDOCAINE 5 % EX OINT: 1.0000 | TOPICAL_OINTMENT | CUTANEOUS | 0 refills | Status: DC | PRN

## 2022-04-27 MED ORDER — NYSTATIN 100000 UNIT/GM EX POWD: 1.0000 | Freq: Three times a day (TID) | CUTANEOUS | 0 refills | Status: DC

## 2022-04-27 NOTE — Patient Instructions (Addendum)
We are referring you to [facility] to further help with your inability to empty your bladder properly, which may be complicating the skin and pain issues around the vulva and perineum.  I do think you should also follow up with the gynecologist-oncologist (Dr Berline Lopes was the last one seen, in July).  We are checking labs today to ensure that your kidneys are still okay (a concern if the bladder isn't emptying well), and to follow up on the abnormal liver tests from August.  Depending on the result of the tests, we may also need to do more imaging tests.   I encourage you to get a flu shot, COVID booster, and the RSV vaccine (you need to get the RSV from the pharmacy, the others could be from the pharmacy or from our office).

## 2022-04-28 LAB — COMPREHENSIVE METABOLIC PANEL
ALT: 29 IU/L (ref 0–32)
AST: 30 IU/L (ref 0–40)
Albumin/Globulin Ratio: 1.4 (ref 1.2–2.2)
Albumin: 4.2 g/dL (ref 3.9–4.9)
Alkaline Phosphatase: 88 IU/L (ref 44–121)
BUN/Creatinine Ratio: 15 (ref 12–28)
BUN: 8 mg/dL (ref 8–27)
Bilirubin Total: 0.4 mg/dL (ref 0.0–1.2)
CO2: 23 mmol/L (ref 20–29)
Calcium: 10.5 mg/dL — ABNORMAL HIGH (ref 8.7–10.3)
Chloride: 98 mmol/L (ref 96–106)
Creatinine, Ser: 0.53 mg/dL — ABNORMAL LOW (ref 0.57–1.00)
Globulin, Total: 3 g/dL (ref 1.5–4.5)
Glucose: 66 mg/dL — ABNORMAL LOW (ref 70–99)
Potassium: 3.9 mmol/L (ref 3.5–5.2)
Sodium: 141 mmol/L (ref 134–144)
Total Protein: 7.2 g/dL (ref 6.0–8.5)
eGFR: 101 mL/min/{1.73_m2} (ref >59)

## 2022-04-28 LAB — CBC WITH DIFFERENTIAL/PLATELET
Basophils Absolute: 0 10*3/uL (ref 0.0–0.2)
Basos: 0 %
EOS (ABSOLUTE): 0.1 10*3/uL (ref 0.0–0.4)
Eos: 0 %
Hematocrit: 37.8 % (ref 34.0–46.6)
Hemoglobin: 11.4 g/dL (ref 11.1–15.9)
Immature Grans (Abs): 0 10*3/uL (ref 0.0–0.1)
Immature Granulocytes: 0 %
Lymphocytes Absolute: 4 10*3/uL — ABNORMAL HIGH (ref 0.7–3.1)
Lymphs: 35 %
MCH: 22.5 pg — ABNORMAL LOW (ref 26.6–33.0)
MCHC: 30.2 g/dL — ABNORMAL LOW (ref 31.5–35.7)
MCV: 75 fL — ABNORMAL LOW (ref 79–97)
Monocytes Absolute: 0.8 10*3/uL (ref 0.1–0.9)
Monocytes: 7 %
Neutrophils Absolute: 6.5 10*3/uL (ref 1.4–7.0)
Neutrophils: 58 %
Platelets: 404 10*3/uL (ref 150–450)
RBC: 5.07 x10E6/uL (ref 3.77–5.28)
RDW: 15.5 % — ABNORMAL HIGH (ref 11.7–15.4)
WBC: 11.4 10*3/uL — ABNORMAL HIGH (ref 3.4–10.8)

## 2022-05-01 LAB — URINE CULTURE

## 2022-05-01 NOTE — Progress Notes (Signed)
Patient's daughter advised. They would like to see both.

## 2022-05-09 ENCOUNTER — Ambulatory Visit (INDEPENDENT_AMBULATORY_CARE_PROVIDER_SITE_OTHER): Payer: Commercial Managed Care - HMO | Admitting: Urology

## 2022-05-09 ENCOUNTER — Ambulatory Visit: Payer: Self-pay | Admitting: Urology

## 2022-05-09 VITALS — BP 163/81 | HR 105 | Wt 164.5 lb

## 2022-05-09 DIAGNOSIS — R309 Painful micturition, unspecified: Secondary | ICD-10-CM

## 2022-05-09 DIAGNOSIS — R3914 Feeling of incomplete bladder emptying: Secondary | ICD-10-CM | POA: Diagnosis not present

## 2022-05-09 DIAGNOSIS — R350 Frequency of micturition: Secondary | ICD-10-CM

## 2022-05-09 DIAGNOSIS — R339 Retention of urine, unspecified: Secondary | ICD-10-CM

## 2022-05-09 LAB — BLADDER SCAN AMB NON-IMAGING: Scan Result: 42

## 2022-05-09 NOTE — Progress Notes (Signed)
I, DeAsia L Maxie,acting as a scribe for Hollice Espy, MD.,have documented all relevant documentation on the behalf of Hollice Espy, MD,as directed by  Hollice Espy, MD while in the presence of Hollice Espy, MD.   I, Amy L Pierron,acting as a scribe for Hollice Espy, MD.,have documented all relevant documentation on the behalf of Hollice Espy, MD,as directed by  Hollice Espy, MD while in the presence of Hollice Espy, MD.  05/09/22  2:03 PM   Ellen Ryan 31-Dec-1955 [id number]  Referring provider: Allward[place], Randa Evens, PA-C [place],  [place] [id number]  Chief Complaint  Patient presents with   Establish Care   Urinary Frequency    HPI: 67 year-old female followed by a gynonc and urogynecology referred for possible second opinion today.  She is accompanied today by her daughter.  She was offered interpreter but declined.  She has a history of extramammary paget's disease, s/p vulvectomy in 2018. She has complete obliteration, agglutination of the vagina appreciated. In 2019, she underwent CO2 of her urethral meatus for occurrence. She's now being followed for chronic pelvic and perineal pain by Dr. [name] (Urogyn) and Dr. Berline Lopes (Gyn Onc). She was last seen in July for this same issue. Dr. [name] also manages her urinary symptoms which are urgency, frequency and, weak stream. She has a history of one UTI.   She had a suprapubic tube placed Sep 21, 2021 after she developed urinary retention postoperatively after her most recent return to the operating room that same day. Interoperative photos from that day were also reviewed of her vulva and perineum.  She has tried Oxybutynin and Trospium but was not effective. She did not try Myrbetriq due to cost. She recently established care with Dr. Rita Ohara.   She states that while at home, the suprapubic tube fell out 2 days after it was placed but she never had it replaced. She reports that it feels like  something is "crawling" inside of her bladder/vulva and now radiating to towards her perineum toward her anus/rectum.  She is requesting an MRI. She notes weak stream. She also complaints of incontinence which she is having to wear diapers for protection. She notes perineal pain.  She is accompanied by her daughter today who is helping translate at times however [place] understands most things in [place].   Results for orders placed or performed in visit on 05/09/22  Bladder Scan (Post Void Residual) in office  Result Value Ref Range   Scan Result 42 ml     PMH: Past Medical History:  Diagnosis Date   Aortic valve stenosis    per echo 01-16-2017  very mild AV stenosis w/ valve area 1.85cm^2   Arthritis    both knees   Disc degeneration, lumbar    L4-L5   Grade I diastolic dysfunction 76/72/0947   Noted on ECHO   History of dysplasia of vulva    09-21-2017  VIN3   Hyperlipidemia    Hypertension    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[place])    extramammary-- s/p complete vulvectomy 09-21-2016   Type 2 diabetes mellitus ([place]) 12/27/2016   Uterine fibroid    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    Surgical History: Past Surgical History:  Procedure Laterality Date   CO2 LASER APPLICATION N/A 0/96/2836   Procedure: CO2 LASER APPLICATION OF THE VULVA;  Surgeon: Everitt Amber, MD;  Location: [facility];  Service: Gynecology;  Laterality: N/A;   CO2  LASER APPLICATION N/A 5/53/7482   Procedure: VAGINOSCOPY, VULVAR BIOPSIES;  Surgeon: Lafonda Mosses, MD;  Location: [facility];  Service: Gynecology;  Laterality: N/A;   EUA/ VULVA BIOPSY  09-01-2016  [name]  [place] N/A 09/21/2016   Procedure: RADICAL VULVECTOMY COMPLETE;  Surgeon: Everitt Amber, MD;  Location: [facility];  Service: Gynecology;  Laterality: N/A;   TRANSTHORACIC ECHOCARDIOGRAM  01/16/2017   ef 60-65%,  grade 1 diastolic dysfunction/  very mild AV stenosis with  moderately thickened and calcified leaflets (valve area 1.85cm^2)/  moderate LAE   VULVA /PERINEUM BIOPSY N/A 09/13/2017   Procedure: VULVAR BIOPSY;  Surgeon: Everitt Amber, MD;  Location: [facility];  Service: Gynecology;  Laterality: N/A;   VULVA Milagros Loll BIOPSY N/A 08/31/2021   Procedure: VULVAR BIOPSY;  Surgeon: Lahoma Crocker, MD;  Location: [facility];  Service: Gynecology;  Laterality: N/A;    Home Medications:  Allergies as of 05/09/2022       Reactions   Oxycodone Itching        Medication List        Accurate as of May 09, 2022  2:03 PM. If you have any questions, ask your nurse or doctor.          STOP taking these medications    nitrofurantoin (macrocrystal-monohydrate) 100 MG capsule Commonly known as: Macrobid       TAKE these medications    acetaminophen 325 MG tablet Commonly known as: TYLENOL Take 650 mg by mouth every 6 (six) hours as needed.   amLODipine 10 MG tablet Commonly known as: NORVASC Take 1 tablet (10 mg total) by mouth daily.   atorvastatin 40 MG tablet Commonly known as: LIPITOR Take 1 tablet by mouth daily.   blood glucose meter kit and supplies Kit Dispense based on patient and insurance preference. Use up to four times daily as directed.   freestyle lancets USE ONE LANCET TO TEST FOUR TIMES A DAY   FREESTYLE LITE test strip Generic drug: glucose blood USE ONE STRIP TO TEST FOUR TIMES A DAY   gabapentin 300 MG capsule Commonly known as: NEURONTIN TAKE ONE CAPSULE BY MOUTH THREE TIMES A DAY   ibuprofen 200 MG tablet Commonly known as: ADVIL Take 400 mg by mouth as needed. Reminded to stop 48 hours prior to surgery   imiquimod 5 % cream Commonly known as: Aldara Apply topically 3 (three) times a week.   lidocaine 5 % ointment Commonly known as: XYLOCAINE Apply 1 Application topically as needed. Apply to vulva as needed for pain   MELATONIN PO Take by mouth.   metFORMIN 500  MG tablet Commonly known as: GLUCOPHAGE TAKE 1 TABLET BY MOUTH TWICE A DAY WITH A MEAL   multivitamin with minerals Tabs tablet Take 1 tablet by mouth daily. Centrum   nystatin powder Commonly known as: MYCOSTATIN/NYSTOP Apply 1 Application topically 3 (three) times daily.   polyethylene glycol 17 g packet Commonly known as: MIRALAX / GLYCOLAX Take 17 g by mouth daily as needed. As needed   senna 8.6 MG Tabs tablet Commonly known as: SENOKOT Take 1 tablet (8.6 mg total) by mouth at bedtime.   True Metrix Meter w/Device Kit Use as directed        Allergies:  Allergies  Allergen Reactions   Oxycodone Itching    Family History: Family History  Problem Relation Age of Onset   Stroke Mother    Arthritis Mother  Diabetes Father    Cancer Daughter     Social History:  reports that she has never smoked. She has never used smokeless tobacco. She reports that she does not currently use alcohol. She reports that she does not use drugs.   Physical Exam: BP (!) 163/81   Pulse (!) 105   Wt 164 lb 8 oz (74.6 kg)   BMI 34.38 kg/m   Constitutional:  Alert and oriented, No acute distress. HEENT:  AT, moist mucus membranes.  Trachea midline, no masses. Neurologic: Grossly intact, no focal deficits, moving all 4 extremities. Psychiatric: Normal mood and affect.  Results for orders placed or performed in visit on 05/09/22  Bladder Scan (Post Void Residual) in office  Result Value Ref Range   Scan Result 42 ml      Assessment & Plan:    Sensation of incomplete bladder emptying/urinary pain -Extensive chart review today including photos of her most recent pelvic exam all personally reviewed, extremely complex patient  - PVR was minimal today. We discussed the risks and benefits of replacing the SP tube. At this point in time she is not in urinary retention. We discussed the goals of having the SP tube which could reduce the frequency in daytime visits potentially but  can't ensure that. There's some risks associated with the SP tube including infection, need for replacement, long term risk of malignancy, etc. she has failed previous anticholinergics and not able to afford beta 3 agonist.  Access into the bladder for utilization of treatment such as Botox would be complicated and high risk for retention.  -She's most concerned today with her perineal discomfort. She feels like something is crawling inside of her and reqeusting an MRI. Defer this discussion to Dr. [name], she's probably the most appropriate to address this and order diagnostic imaging. Defer the discussion of bladder management to her as well in this very complex patient. I'll send her note via [place].  Return if symptoms worsen or fail to improve.  I have reviewed the above documentation for accuracy and completeness, and I agree with the above.   Hollice Espy, MD   [facility] [address] [place],  [address] [phone]   I spent 65 total minutes on the day of the encounter including pre-visit review of the medical record, face-to-face time with the patient, and post visit ordering of labs/imaging/tests.

## 2022-05-12 NOTE — Progress Notes (Signed)
[facility] Return Visit  SUBJECTIVE  History of Present Illness: Ellen Ryan is a 67 y.o. female seen in follow-up for levator spasm and vaginal pain. Plan at last visit was start pelvic PT.   Patient reports she did pelvic PT and that it did not help.   Patient reports that in the morning she feels like her legs are "having a seizure" and the muscles in her legs are pulling into the vagina.   She endorses a crawling sensation in the vulvar region that is going into the area of the urethra.   She reports she feels like her urine is being blocked. Patient is using wads of tissue to provide counter pressure as she feels like things are "falling out" of her.   Patient's daughter is requesting an MRI. She feels like there is something be missed neurologically in the pelvis that is contributing to patient's symptoms.  Patient's daughter reports she has been encouraged by other providers to get her mother psychiatrically evaluated, but that she does not feel like this is mental.    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   Past Surgical History: She  has a past surgical history that includes Radical vulvectomy (N/A, 09/21/2016); Vulva / perineum biopsy (N/A, 09/13/2017); EUA/ VULVA BIOPSY (09-01-2016  dr dove  [facility]); transthoracic echocardiogram (01/16/2017); Co2 laser application (N/A, 3/53/2992); Vulva / perineum biopsy (N/A, 08/31/2021); and Co2 laser application (N/A, 08/18/8339).   Medications: She has a current medication list which includes the following prescription(s): acetaminophen, amlodipine, atorvastatin, blood glucose meter kit and supplies,  true metrix meter, gabapentin, freestyle lite, ibuprofen, imiquimod, freestyle, lidocaine, melatonin, metformin, multivitamin with minerals, NONFORMULARY OR COMPOUNDED ITEM, nystatin, polyethylene glycol, and senna.   Allergies: Patient is allergic to oxycodone.   Social History: Patient  reports that she has never smoked. She has never used smokeless tobacco. She reports that she does not currently use alcohol. She reports that she does not use drugs.      OBJECTIVE    Patient had CT scan  09/23/21 that showed:  Dislodged suprapubic catheter with tip in the left subcutaneous soft tissues of the left groin. Wall thickening of the urinary bladder and perivesicular fat stranding, most pronounced along the left superior wall with small locule of air.   Patient had a US Pelvis that showed: Calcified fibroids, 25m Endometrium stripe, and non-visualized ovaries.     Physical Exam: Vitals:   05/15/22 1045  BP: 121/74  Pulse: (!) 106   Gen: No apparent distress, A&O x 3.  Detailed Urogynecologic Evaluation:  Unable to perform internal exam due to vaginal fusion.  Patient has significant skin breakdown between the vulva and skin of the glutes.     ASSESSMENT AND PLAN    Ellen Ryan a 67y.o. with:  1. Vaginal ulceration   2. Pelvic pain    Patient has significant skin ulceration at the region where the vulvar meets the glues on bilateral sides of the vulva. Patient is pointing to this region when she is indicating a crawling sensation and saying something is "biting" her. Will try vulvadynia cream with mix of amitriptyline, gabapentin, and baclofen for pain in the area.   Patient unable to tolerate much movement in  the pelvis. She needed assistance moving her legs into the stirrups for examination and has trouble sitting or standing due to the reported pain. Will discuss with Dr. [name] and see if an MRI may be of benefit at patient's request. We discussed that insurance may not cover  imaging but that I would talk with the physician to see.  Patient to follow up in 1 month with Dr. [name] for evaluation of symptoms.

## 2022-05-15 ENCOUNTER — Encounter: Payer: Self-pay | Admitting: Obstetrics and Gynecology

## 2022-05-15 ENCOUNTER — Ambulatory Visit: Payer: Commercial Managed Care - HMO | Admitting: Obstetrics and Gynecology

## 2022-05-15 VITALS — BP 121/74 | HR 106

## 2022-05-15 DIAGNOSIS — N765 Ulceration of vagina: Secondary | ICD-10-CM

## 2022-05-15 DIAGNOSIS — R102 Pelvic and perineal pain: Secondary | ICD-10-CM

## 2022-05-15 MED ORDER — NONFORMULARY OR COMPOUNDED ITEM: 5 refills | Status: DC

## 2022-05-15 NOTE — Patient Instructions (Signed)
[facility]  Address: [address], [place], [place] [address] Hours:  Open ? Closes 7?PM Phone: [phone]

## 2022-05-16 ENCOUNTER — Telehealth: Payer: Self-pay

## 2022-05-16 NOTE — Telephone Encounter (Signed)
Medication sent [place].

## 2022-05-17 ENCOUNTER — Telehealth: Payer: Self-pay

## 2022-05-17 NOTE — Telephone Encounter (Signed)
Spoke with Daughter Loyal Gambler and gave her an appointment with Dr. Berline Lopes for her mother on 06-09-22 at 4:15 pm per Melissa Cross,NP.   She needs to arrive at 4 pm to register. Daughter verbalized understanding.

## 2022-05-25 DIAGNOSIS — N903 Dysplasia of vulva, unspecified: Secondary | ICD-10-CM

## 2022-05-25 HISTORY — DX: Dysplasia of vulva, unspecified: N90.3

## 2022-05-30 ENCOUNTER — Encounter: Payer: Self-pay | Admitting: Obstetrics and Gynecology

## 2022-05-30 ENCOUNTER — Ambulatory Visit: Payer: Commercial Managed Care - HMO | Admitting: Obstetrics and Gynecology

## 2022-05-30 VITALS — BP 137/83 | HR 111

## 2022-05-30 DIAGNOSIS — G8929 Other chronic pain: Secondary | ICD-10-CM | POA: Diagnosis not present

## 2022-05-30 MED ORDER — METHOCARBAMOL 500 MG PO TABS: 500.0000 mg | ORAL_TABLET | Freq: Three times a day (TID) | ORAL | 1 refills | Status: DC | PRN

## 2022-05-30 MED ORDER — IBUPROFEN 600 MG PO TABS: 600.0000 mg | ORAL_TABLET | Freq: Four times a day (QID) | ORAL | 1 refills | Status: DC | PRN

## 2022-05-30 NOTE — Patient Instructions (Signed)
Prescribed '600mg'$  and you can take up to every 6 hours.   The muscle relaxant prescribed is methocarbamol. You can take up four times a day.   You have been referred to physical medicine and rehabilitation for further pain evaluation.

## 2022-05-30 NOTE — Progress Notes (Signed)
[facility] Return Visit  SUBJECTIVE  History of Present Illness: Ellen Ryan is a 67 y.o. female seen in follow-up pelvic pain. She has an extensive history of pagets of the vulva and VIN with multiple resections, FGM Type 1, now with obliteration of the vagina.   Crawling sensation is going to the anus. Sometimes feels like a rod internally or a hot stone. Has pressure and heaviness between her legs. Pain has been increasing. She cannot sit for long periods and cannot stand up. When she urinates, it causes pain because her perineum is raw and uncomfortable. She has some urine spraying but empties her bladder well. But she feels the urine is not really the main issue, but the pelvic heaviness is what is the most pressing problem. Had done pelvic PT. Per the PT notes, pain had improved while doing the therapy, but the patient and daughter stated pain relief was temporary.   She is not sure if the flexeril previously prescribed helped since it made her mouth dry so she stopped taking it. She is taking gabapentin capsule but not sure if it is helping. She does not feel the gabapentin cream has been helpful. When she took oxycodone previously, it did help with her symptoms. Ibuprofen can help with her pain, but it does not last. She is taking '200mg'$  ibuprofen daily.   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   Past Surgical History: She  has a past surgical history that includes Radical vulvectomy (N/A, 09/21/2016); Vulva / perineum biopsy (N/A, 09/13/2017); EUA/ VULVA BIOPSY (09-01-2016  dr dove  [facility]);  transthoracic echocardiogram (01/16/2017); Co2 laser application (N/A, 4/78/2956); Vulva / perineum biopsy (N/A, 08/31/2021); and Co2 laser application (N/A, 06/07/863).   Medications: She has a current medication list which includes the following prescription(s): acetaminophen, amlodipine, atorvastatin, blood glucose meter kit and supplies, true metrix meter, gabapentin, freestyle lite, ibuprofen, ibuprofen, imiquimod, freestyle, lidocaine, melatonin, metformin, methocarbamol, multivitamin with minerals, NONFORMULARY OR COMPOUNDED ITEM, nystatin, polyethylene glycol, and senna.   Allergies: Patient has no active allergies.   Social History: Patient  reports that she has never smoked. She has never used smokeless tobacco. She reports that she does not currently use alcohol. She reports that she does not use drugs.      OBJECTIVE     Physical Exam: Vitals:   05/30/22 1337  BP: 137/83  Pulse: (!) 111   Gen: No apparent distress, A&O x 3.  Detailed Urogynecologic Evaluation:  Deferred.    ASSESSMENT AND PLAN    Ms. Aron is a 67 y.o. with:  1. Other chronic pain    - Long conversation today with patient and her daughter. Although she has urinary symptoms and urine spraying, this is not her most bothersome issue. She feels that she is emptying her bladder well. We did discuss that if she returns to the OR with GYN Onc for biopsy or other treatment, then we can plan for EUA to further evaluate urethra and vulva.  - Symptoms of pressure and pelvic pain seem to be more musculoskeletal related as this causes difficulty with sitting and poor mobility. We reviewed that although there may be injections that I  can do to can help with this (pudendal nerve block, possibly trigger point injections), these are all temporary pain relief measures and not meant to be chronic treatment. Although she has had pelvic imaging recently, she has not had evaluation of her spine/ back or other joints, which  could also be contributing to poor mobility and pain. Recommended referral to PM&R pain clinic for further pain management evaluation and treatment. - She gets temporary relief from low dose ibuprofen, and has had improvement in pain with oxycodone. Will start '600mg'$  ibuprofen q 6 hours prn. Discussed we can increase to '800mg'$  but she was concerned about the higher doses.  - Also prescribed methocarbamol '500mg'$  q8hrs prn.   Jaquita Folds, MD  Time spent: I spent 45 minutes dedicated to the care of this patient on the date of this encounter to include pre-visit review of records, face-to-face time with the patient and post visit documentation and ordering medication/ testing.

## 2022-06-08 ENCOUNTER — Encounter: Payer: Self-pay | Admitting: Physical Medicine & Rehabilitation

## 2022-06-08 ENCOUNTER — Encounter: Payer: Self-pay | Admitting: Gynecologic Oncology

## 2022-06-09 ENCOUNTER — Inpatient Hospital Stay: Payer: Commercial Managed Care - HMO | Attending: Gynecologic Oncology | Admitting: Gynecologic Oncology

## 2022-06-09 ENCOUNTER — Encounter: Payer: Self-pay | Admitting: Gynecologic Oncology

## 2022-06-09 VITALS — BP 149/68 | HR 101 | Temp 98.9°F | Resp 16 | Wt 166.8 lb

## 2022-06-09 DIAGNOSIS — D071 Carcinoma in situ of vulva: Secondary | ICD-10-CM | POA: Diagnosis present

## 2022-06-09 DIAGNOSIS — Z9079 Acquired absence of other genital organ(s): Secondary | ICD-10-CM | POA: Diagnosis not present

## 2022-06-09 DIAGNOSIS — Z8544 Personal history of malignant neoplasm of other female genital organs: Secondary | ICD-10-CM | POA: Diagnosis not present

## 2022-06-09 DIAGNOSIS — R102 Pelvic and perineal pain: Secondary | ICD-10-CM | POA: Diagnosis not present

## 2022-06-09 MED ORDER — OXYCODONE HCL 5 MG PO TABS: 5.0000 mg | ORAL_TABLET | ORAL | 0 refills | Status: DC | PRN

## 2022-06-09 NOTE — Progress Notes (Signed)
Gynecologic Oncology Return Clinic Visit  06/09/22  Reason for Visit: follow-up in the setting of a history of Pagets disease, more recently vulvar dysplasia  Treatment History: Ellen Ryan is 67 year old female initially seen at the request of [name] for extramammary pagets disease.  The patient is visiting her daughter now and lives in [place]. She reported a 2 year history of vulvar ulceration. She was seen by [name] for this problem in April, 2018 and was taken to the OR for an examination under anesthesia on 09/01/16. Extensive ulceration was present. 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     On 09/21/16, she underwent a complete simple vulvectomy.  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 No invasive carcinoma, negative margins. Post-operative course was uneventful and she was discharged with a foley catheter for one week post-op.    She has urinary urgency not resolved by medications. She feels her urine deviates in stream. Since early April, 2019 she developed irritation of the vulva made better with silvadine cream.   Given that she did not tolerate an office examination she was taken to the operating room on Sep 13, 2017 for examination under anesthesia and vulvar biopsies.  Intraoperative findings were significant for subtle acetowhite changes immediately lateral on the right side of the urethral meatus.  There also acetowhite changes to the midline distal vagina.  Vagina was completely agglutinated.  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  The posterior vaginal introitus biopsy revealed benign normal squamous mucosa.   On 10/19/17 she underwent CO2 laser to the urethral meatus.   In May, 2020 she started experiencing a burning sensation at the skin of her introitus/vulva. She had been prescribed clobetasol but had been using silvadine instead of clobetasol. In 10/2018 was recommended to use nightly clobetasol.    Patient represented for care in 07/2021 with sensation of "skin crawling". Had self treated for possible pinworms. Due to pain limiting exam, EUA and biopsies performed on 08/31/21. Bilateral biopsies were taken showing VIN3 with associated erosion and inflammation. No Paget's disease identified in these biopsies.    Underwent exam under anesthesia on 09/21/2021 as well as vulvar biopsies.  Findings notable for them with complete fusion of the superior labia with only a small, 1-2 cm opening at the posterior fourchette.  Given symptoms suspected to be related to urine being trapped under her fused labia, recommendation was made for suprapubic catheter which was placed in the PACU.  Unfortunately, this got dislodged several days after surgery.  1 vulvar biopsy showed mucosal inflammation and granulation tissue, no dysplasia.  Second vulvar biopsy showed VIN 3, no invasive carcinoma.  Interval History: The patient last saw me in July 2023.  At that time, given biopsy showing high-grade dysplasia, had discussed surgical treatment although the patient preferred treating vulva with Aldara.  She reports completing this treatment, is unsure how long she use the Aldara cream.  Patient has follow-up with Dr. [name] in the meantime.  She went to physical therapy which both she and her daughter do not feel helped her symptoms at all.  She is currently using an amitriptyline topical gel that she does not feel is helping either.  She describes significant worsening of her symptoms with such pelvic pain that it is difficult to sit and difficult to walk.  She continues to feel as though there is something within her vagina and extending towards her right buttocks.  She  had previously suspected that there was a worm in her vagina.  She now voices concern that there is something larger in her vagina given the movement that she feels.  At times she feels that her vagina and her pelvis is expanding.  She continues to be  able to urinate but describes having to push like she is having a bowel movement to urinate.  She also continues to have some sensation of crawling underneath the skin or within her vagina.  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[place])    extramammary-- s/p complete vulvectomy 09-21-2016   Type 2 diabetes mellitus ([place]) 12/27/2016   Uterine fibroid    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    Past Surgical History:  Procedure Laterality Date   CO2 LASER APPLICATION N/A [id number]   Procedure: CO2 LASER APPLICATION OF THE VULVA;  Surgeon: Everitt Amber, MD;  Location: [facility];  Service: Gynecology;  Laterality: N/A;   CO2 LASER APPLICATION N/A [id number]   Procedure: VAGINOSCOPY, VULVAR BIOPSIES;  Surgeon: Lafonda Mosses, MD;  Location: [facility];  Service: Gynecology;  Laterality: N/A;   EUA/ VULVA BIOPSY  09-01-2016  [name]  [place] N/A 09/21/2016   Procedure: RADICAL VULVECTOMY COMPLETE;  Surgeon: Everitt Amber, MD;  Location: [facility];  Service: Gynecology;  Laterality: N/A;   TRANSTHORACIC ECHOCARDIOGRAM  01/16/2017   ef 60-65%,  grade 1 diastolic dysfunction/  very mild AV stenosis with moderately thickened and calcified leaflets (valve area 1.85cm^2)/  moderate LAE   VULVA /PERINEUM BIOPSY N/A 09/13/2017   Procedure: VULVAR BIOPSY;  Surgeon: Everitt Amber, MD;  Location: [facility];  Service: Gynecology;  Laterality: N/A;   VULVA Milagros Loll BIOPSY N/A 08/31/2021   Procedure: VULVAR BIOPSY;  Surgeon: Lahoma Crocker, MD;  Location: [facility];  Service: Gynecology;  Laterality: N/A;    Family History  Problem Relation Age of Onset   Stroke Mother    Arthritis Mother    Diabetes Father    Cancer Daughter     Social History   Socioeconomic History   Marital status: Widowed    Spouse name: Not on file   Number of children: Not on file   Years of education: Not on file   Highest education level: Not on file  Occupational History   Not on file  Tobacco Use   Smoking status: Never   Smokeless tobacco: Never  Vaping Use   Vaping Use: Never used  Substance and Sexual Activity   Alcohol use: Not Currently   Drug use: No   Sexual activity: Not Currently    Birth control/protection: Post-menopausal  Other Topics Concern   Not on file  Social History Narrative   Not on file   Social Determinants of Health   Financial Resource Strain: Not on file  Food Insecurity: Not on file  Transportation Needs: Not on file  Physical Activity: Not on file  Stress: Not on file  Social Connections:  Not on file    Current Medications:  Current Outpatient Medications:    acetaminophen (TYLENOL) 325 MG tablet, Take 650 mg by mouth every 6 (six) hours as needed., Disp: , Rfl:    amLODipine (NORVASC) 10 MG tablet, Take 1 tablet (10 mg total) by mouth daily., Disp: 90 tablet, Rfl: 3   atorvastatin (LIPITOR) 40 MG tablet, Take 1 tablet by mouth daily., Disp: , Rfl:    blood glucose meter kit and supplies KIT, Dispense based on patient and insurance preference. Use up to four times daily as directed., Disp: 1 each, Rfl: 0   Blood Glucose Monitoring Suppl (TRUE METRIX METER) w/Device KIT, Use as directed, Disp: 1 kit, Rfl: 0   gabapentin (NEURONTIN) 300 MG capsule, Take 300 mg by mouth 3 (three) times daily., Disp: , Rfl:    glucose blood (FREESTYLE LITE) test strip, USE ONE STRIP TO TEST FOUR TIMES A DAY, Disp: 100 strip, Rfl: 0   ibuprofen (ADVIL) 600 MG tablet, Take 1 tablet (600 mg total) by mouth every 6 (six)  hours as needed., Disp: 60 tablet, Rfl: 1   Lancets (FREESTYLE) lancets, USE ONE LANCET TO TEST FOUR TIMES A DAY, Disp: 100 each, Rfl: 0   MELATONIN PO, Take by mouth., Disp: , Rfl:    metFORMIN (GLUCOPHAGE) 500 MG tablet, TAKE 1 TABLET BY MOUTH TWICE A DAY WITH A MEAL, Disp: 360 tablet, Rfl: 0   methocarbamol (ROBAXIN) 500 MG tablet, Take 1 tablet (500 mg total) by mouth every 8 (eight) hours as needed for muscle spasms., Disp: 120 tablet, Rfl: 1   Multiple Vitamin (MULTIVITAMIN WITH MINERALS) TABS tablet, Take 1 tablet by mouth daily. Centrum, Disp: , Rfl:    NONFORMULARY OR COMPOUNDED ITEM, Amitriptyline 2.5%/ gabapentin 2.5%/ baclofen 2.5% in vaginal cream.  Place 1g daily in vaginal/ vulvar area.  Dispense 60g with 5 refills., Disp: 60 each, Rfl: 5   oxyCODONE (OXY IR/ROXICODONE) 5 MG immediate release tablet, Take 1 tablet (5 mg total) by mouth every 4 (four) hours as needed for severe pain., Disp: 30 tablet, Rfl: 0   polyethylene glycol (MIRALAX / GLYCOLAX) packet, Take 17 g by mouth daily as needed. As needed, Disp: , Rfl:    senna (SENOKOT) 8.6 MG TABS tablet, Take 1 tablet (8.6 mg total) by mouth at bedtime., Disp: 30 each, Rfl: 0  Review of Systems: Pertinent positives as per HPI.  Physical Exam: BP (!) 149/68 (BP Location: Left Arm, Patient Position: Sitting)   Pulse (!) 101   Temp 98.9 F (37.2 C) (Oral)   Resp 16   Wt 166 lb 12.8 oz (75.7 kg)   SpO2 97%   BMI 34.86 kg/m  General: Alert, oriented, no acute distress although visibly uncomfortable. HEENT: Normocephalic, atraumatic, sclera anicteric. Chest: Unlabored breathing on room air. Abdomen: Obese, soft, nontender.  Normoactive bowel sounds.  No masses or hepatosplenomegaly appreciated.   Extremities: Grossly normal range of motion.  Warm, well perfused.  No edema bilaterally. GU: Exam is quite limited given her discomfort.  Area previously noted to be mildly erythematous with desquamation on the vulva is now beefy red  in color and appears quite irritated.  I do not palpate anything along the right vulva and right buttocks where patient describes having much of her pain.  Laboratory & Radiologic Studies: None new  Assessment & Plan: Ellen Ryan is a 67 y.o. woman with history of vulvar dysplasia, recent biopsy showing high-grade vulvar dysplasia, with almost complete labial fusion and obliteration  of her vagina.   I discussed with the patient and her daughter findings on my limited exam.  I am concerned given the vulvar pain that she is having as well as the appearance of her vulva that there is at least persistent precancer.  I am recommending surgery for both diagnosis (plan to biopsy at the time) and possible treatment, whether with excision or laser ablation.  She is not considerably more pain in the last time I saw her.  She continues to have numerous symptoms related to her low pelvis/vaginal area.  Prior imaging, performed as recently as the summer 2023 with CT scan and ultrasound did not show any obvious source for the symptoms.  I discussed getting an MRI to better look at the soft tissue and vaginal area given basically complete fusion of her labia and inability to perform an internal exam.  I continue to worry that an element of her symptoms is related to difficulty with urine passing out of her urethra.  While she has had bladder scans that have not shown much residual in her bladder.  If she was emptying partially out of the urethra but some of the urine was getting caught within the vagina, this would not be easily seen on a bladder scan.  We have previously discussed surgery including labial reconstruction to try to identify the urethra and attempt to reestablish some more normal labial anatomy.  After I get the MRI results back, I will reach out to Dr. [name] about the possibility of attempting such a procedure at the time of diagnostic and therapeutic surgery for her vulvar disease.  32 minutes  of total time was spent for this patient encounter, including preparation, face-to-face counseling with the patient and coordination of care, and documentation of the encounter.  Jeral Pinch, MD  Division of Gynecologic Oncology  Department of Obstetrics and Gynecology  [facility]

## 2022-06-09 NOTE — Patient Instructions (Signed)
I will call you after I get the MRI results and speak with Dr. [name]. We will tentatively plan for a surgery for me to do an exam, biopsies, possible laser treatment or removal of any precancerous areas.

## 2022-06-09 NOTE — H&P (View-Only) (Signed)
Gynecologic Oncology Return Clinic Visit  06/09/22  Reason for Visit: follow-up in the setting of a history of Pagets disease, more recently vulvar dysplasia  Treatment History: Ellen Ryan is 67 year old female initially seen at the request of [name] for extramammary pagets disease.  The patient is visiting her daughter now and lives in [place]. She reported a 2 year history of vulvar ulceration. She was seen by [name] for this problem in April, 2018 and was taken to the OR for an examination under anesthesia on 09/01/16. Extensive ulceration was present. 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     On 09/21/16, she underwent a complete simple vulvectomy.  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 No invasive carcinoma, negative margins. Post-operative course was uneventful and she was discharged with a foley catheter for one week post-op.    She has urinary urgency not resolved by medications. She feels her urine deviates in stream. Since early April, 2019 she developed irritation of the vulva made better with silvadine cream.   Given that she did not tolerate an office examination she was taken to the operating room on Sep 13, 2017 for examination under anesthesia and vulvar biopsies.  Intraoperative findings were significant for subtle acetowhite changes immediately lateral on the right side of the urethral meatus.  There also acetowhite changes to the midline distal vagina.  Vagina was completely agglutinated.  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  The posterior vaginal introitus biopsy revealed benign normal squamous mucosa.   On 10/19/17 she underwent CO2 laser to the urethral meatus.   In May, 2020 she started experiencing a burning sensation at the skin of her introitus/vulva. She had been prescribed clobetasol but had been using silvadine instead of clobetasol. In 10/2018 was recommended to use nightly clobetasol.    Patient represented for care in 07/2021 with sensation of "skin crawling". Had self treated for possible pinworms. Due to pain limiting exam, EUA and biopsies performed on 08/31/21. Bilateral biopsies were taken showing VIN3 with associated erosion and inflammation. No Paget's disease identified in these biopsies.    Underwent exam under anesthesia on 09/21/2021 as well as vulvar biopsies.  Findings notable for them with complete fusion of the superior labia with only a small, 1-2 cm opening at the posterior fourchette.  Given symptoms suspected to be related to urine being trapped under her fused labia, recommendation was made for suprapubic catheter which was placed in the PACU.  Unfortunately, this got dislodged several days after surgery.  1 vulvar biopsy showed mucosal inflammation and granulation tissue, no dysplasia.  Second vulvar biopsy showed VIN 3, no invasive carcinoma.  Interval History: The patient last saw me in July 2023.  At that time, given biopsy showing high-grade dysplasia, had discussed surgical treatment although the patient preferred treating vulva with Aldara.  She reports completing this treatment, is unsure how long she use the Aldara cream.  Patient has follow-up with Dr. [name] in the meantime.  She went to physical therapy which both she and her daughter do not feel helped her symptoms at all.  She is currently using an amitriptyline topical gel that she does not feel is helping either.  She describes significant worsening of her symptoms with such pelvic pain that it is difficult to sit and difficult to walk.  She continues to feel as though there is something within her vagina and extending towards her right buttocks.  She  had previously suspected that there was a worm in her vagina.  She now voices concern that there is something larger in her vagina given the movement that she feels.  At times she feels that her vagina and her pelvis is expanding.  She continues to be  able to urinate but describes having to push like she is having a bowel movement to urinate.  She also continues to have some sensation of crawling underneath the skin or within her vagina.  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    Past Surgical History:  Procedure Laterality Date   CO2 LASER APPLICATION N/A [id number]   Procedure: CO2 LASER APPLICATION OF THE VULVA;  Surgeon: Everitt Amber, MD;  Location: [facility];  Service: Gynecology;  Laterality: N/A;   CO2 LASER APPLICATION N/A [id number]   Procedure: VAGINOSCOPY, VULVAR BIOPSIES;  Surgeon: Lafonda Mosses, MD;  Location[facility];  Service: Gynecology;  Laterality: N/A;   EUA/ VULVA BIOPSY  09-01-2016  [name]  [place] N/A 09/21/2016   Procedure: RADICAL VULVECTOMY COMPLETE;  Surgeon: Everitt Amber, MD;  Location: [facility];  Service: Gynecology;  Laterality: N/A;   TRANSTHORACIC ECHOCARDIOGRAM  01/16/2017   ef 60-65%,  grade 1 diastolic dysfunction/  very mild AV stenosis with moderately thickened and calcified leaflets (valve area 1.85cm^2)/  moderate LAE   VULVA /PERINEUM BIOPSY N/A 09/13/2017   Procedure: VULVAR BIOPSY;  Surgeon: Everitt Amber, MD;  Location: [facility];  Service: Gynecology;  Laterality: N/A;   VULVA Milagros Loll BIOPSY N/A 08/31/2021   Procedure: VULVAR BIOPSY;  Surgeon: Lahoma Crocker, MD;  Location: [facility];  Service: Gynecology;  Laterality: N/A;    Family History  Problem Relation Age of Onset   Stroke Mother    Arthritis Mother    Diabetes Father    Cancer Daughter     Social History   Socioeconomic History   Marital status: Widowed    Spouse name: Not on file   Number of children: Not on file   Years of education: Not on file   Highest education level: Not on file  Occupational History   Not on file  Tobacco Use   Smoking status: Never   Smokeless tobacco: Never  Vaping Use   Vaping Use: Never used  Substance and Sexual Activity   Alcohol use: Not Currently   Drug use: No   Sexual activity: Not Currently    Birth control/protection: Post-menopausal  Other Topics Concern   Not on file  Social History Narrative   Not on file   Social Determinants of Health   Financial Resource Strain: Not on file  Food Insecurity: Not on file  Transportation Needs: Not on file  Physical Activity: Not on file  Stress: Not on file  Social Connections:  Not on file    Current Medications:  Current Outpatient Medications:    acetaminophen (TYLENOL) 325 MG tablet, Take 650 mg by mouth every 6 (six) hours as needed., Disp: , Rfl:    amLODipine (NORVASC) 10 MG tablet, Take 1 tablet (10 mg total) by mouth daily., Disp: 90 tablet, Rfl: 3   atorvastatin (LIPITOR) 40 MG tablet, Take 1 tablet by mouth daily., Disp: , Rfl:    blood glucose meter kit and supplies KIT, Dispense based on patient and insurance preference. Use up to four times daily as directed., Disp: 1 each, Rfl: 0   Blood Glucose Monitoring Suppl (TRUE METRIX METER) w/Device KIT, Use as directed, Disp: 1 kit, Rfl: 0   gabapentin (NEURONTIN) 300 MG capsule, Take 300 mg by mouth 3 (three) times daily., Disp: , Rfl:    glucose blood (FREESTYLE LITE) test strip, USE ONE STRIP TO TEST FOUR TIMES A DAY, Disp: 100 strip, Rfl: 0   ibuprofen (ADVIL) 600 MG tablet, Take 1 tablet (600 mg total) by mouth every 6 (six)  hours as needed., Disp: 60 tablet, Rfl: 1   Lancets (FREESTYLE) lancets, USE ONE LANCET TO TEST FOUR TIMES A DAY, Disp: 100 each, Rfl: 0   MELATONIN PO, Take by mouth., Disp: , Rfl:    metFORMIN (GLUCOPHAGE) 500 MG tablet, TAKE 1 TABLET BY MOUTH TWICE A DAY WITH A MEAL, Disp: 360 tablet, Rfl: 0   methocarbamol (ROBAXIN) 500 MG tablet, Take 1 tablet (500 mg total) by mouth every 8 (eight) hours as needed for muscle spasms., Disp: 120 tablet, Rfl: 1   Multiple Vitamin (MULTIVITAMIN WITH MINERALS) TABS tablet, Take 1 tablet by mouth daily. Centrum, Disp: , Rfl:    NONFORMULARY OR COMPOUNDED ITEM, Amitriptyline 2.5%/ gabapentin 2.5%/ baclofen 2.5% in vaginal cream.  Place 1g daily in vaginal/ vulvar area.  Dispense 60g with 5 refills., Disp: 60 each, Rfl: 5   oxyCODONE (OXY IR/ROXICODONE) 5 MG immediate release tablet, Take 1 tablet (5 mg total) by mouth every 4 (four) hours as needed for severe pain., Disp: 30 tablet, Rfl: 0   polyethylene glycol (MIRALAX / GLYCOLAX) packet, Take 17 g by mouth daily as needed. As needed, Disp: , Rfl:    senna (SENOKOT) 8.6 MG TABS tablet, Take 1 tablet (8.6 mg total) by mouth at bedtime., Disp: 30 each, Rfl: 0  Review of Systems: Pertinent positives as per HPI.  Physical Exam: BP (!) 149/68 (BP Location: Left Arm, Patient Position: Sitting)   Pulse (!) 101   Temp 98.9 F (37.2 C) (Oral)   Resp 16   Wt 166 lb 12.8 oz (75.7 kg)   SpO2 97%   BMI 34.86 kg/m  General: Alert, oriented, no acute distress although visibly uncomfortable. HEENT: Normocephalic, atraumatic, sclera anicteric. Chest: Unlabored breathing on room air. Abdomen: Obese, soft, nontender.  Normoactive bowel sounds.  No masses or hepatosplenomegaly appreciated.   Extremities: Grossly normal range of motion.  Warm, well perfused.  No edema bilaterally. GU: Exam is quite limited given her discomfort.  Area previously noted to be mildly erythematous with desquamation on the vulva is now beefy red  in color and appears quite irritated.  I do not palpate anything along the right vulva and right buttocks where patient describes having much of her pain.  Laboratory & Radiologic Studies: None new  Assessment & Plan: Ellen Ryan is a 67 y.o. woman with history of vulvar dysplasia, recent biopsy showing high-grade vulvar dysplasia, with almost complete labial fusion and obliteration  of her vagina.   I discussed with the patient and her daughter findings on my limited exam.  I am concerned given the vulvar pain that she is having as well as the appearance of her vulva that there is at least persistent precancer.  I am recommending surgery for both diagnosis (plan to biopsy at the time) and possible treatment, whether with excision or laser ablation.  She is not considerably more pain in the last time I saw her.  She continues to have numerous symptoms related to her low pelvis/vaginal area.  Prior imaging, performed as recently as the summer 2023 with CT scan and ultrasound did not show any obvious source for the symptoms.  I discussed getting an MRI to better look at the soft tissue and vaginal area given basically complete fusion of her labia and inability to perform an internal exam.  I continue to worry that an element of her symptoms is related to difficulty with urine passing out of her urethra.  While she has had bladder scans that have not shown much residual in her bladder.  If she was emptying partially out of the urethra but some of the urine was getting caught within the vagina, this would not be easily seen on a bladder scan.  We have previously discussed surgery including labial reconstruction to try to identify the urethra and attempt to reestablish some more normal labial anatomy.  After I get the MRI results back, I will reach out to Dr. [name] about the possibility of attempting such a procedure at the time of diagnostic and therapeutic surgery for her vulvar disease.  32 minutes  of total time was spent for this patient encounter, including preparation, face-to-face counseling with the patient and coordination of care, and documentation of the encounter.  Jeral Pinch, MD  Division of Gynecologic Oncology  Department of Obstetrics and Gynecology  [facility], [place]

## 2022-06-12 ENCOUNTER — Encounter: Payer: Self-pay | Admitting: *Deleted

## 2022-06-13 ENCOUNTER — Telehealth: Payer: Self-pay

## 2022-06-13 NOTE — Telephone Encounter (Signed)
Pt's daughter, Loyal Gambler, is aware, per Joylene John NP, a joint procedure with Dr.Tucker and Dr. [name] with Urogyn on February 29 at the outpatient surgery center. She is aware she will receive a call from the surgery center closer to that date. The procedure will start in the afternoon.

## 2022-06-14 ENCOUNTER — Telehealth: Payer: Self-pay | Admitting: *Deleted

## 2022-06-14 NOTE — Telephone Encounter (Signed)
Donata Duff from [facility] called regarding the patient's MRI scan and the authorization. Per Margaretmary Bayley will out cover the MRI to be at any [facility] based facility. Per Margaretmary Bayley will cover the scan to be done at [facility]. MRI rescheduled from 2/22 at [facility] to 2/26 at [facility]. Tom from [facility] had the patient on the other line and gave the new appt date/time/address.   Called and spoke with the patient's daughter and she did receive the information.

## 2022-06-15 ENCOUNTER — Ambulatory Visit: Payer: Commercial Managed Care - HMO | Admitting: Obstetrics and Gynecology

## 2022-06-15 ENCOUNTER — Ambulatory Visit (HOSPITAL_COMMUNITY): Payer: Commercial Managed Care - HMO

## 2022-06-19 ENCOUNTER — Ambulatory Visit (INDEPENDENT_AMBULATORY_CARE_PROVIDER_SITE_OTHER): Payer: Commercial Managed Care - HMO

## 2022-06-19 ENCOUNTER — Encounter (HOSPITAL_BASED_OUTPATIENT_CLINIC_OR_DEPARTMENT_OTHER): Payer: Self-pay | Admitting: Gynecologic Oncology

## 2022-06-19 DIAGNOSIS — G8929 Other chronic pain: Secondary | ICD-10-CM | POA: Diagnosis not present

## 2022-06-19 DIAGNOSIS — R102 Pelvic and perineal pain: Secondary | ICD-10-CM | POA: Diagnosis not present

## 2022-06-19 MED ORDER — GADOBUTROL 1 MMOL/ML IV SOLN
7.5000 mL | Freq: Once | INTRAVENOUS | Status: AC | PRN
  Administered 2022-06-19: 7.5 mL via INTRAVENOUS

## 2022-06-19 NOTE — Progress Notes (Addendum)
Spoke w/ via phone for pre-op interview--- pt's daughter, Loyal Gambler Lab needs dos----  [employer] results------ current EKG in epic / chart COVID test -----patient states asymptomatic no test needed Arrive at ------- 1300 on 06-22-2022 NPO after MN NO Solid Food.  Clear liquids from MN until--- 1200 Med rec completed Medications to take morning of surgery ----- gabapentin, oxycodone if needed, take novasc if takes in am Diabetic medication ----- do not take metformin morning of surgery Patient instructed no nail polish to be worn day of surgery Patient instructed to bring photo id and insurance card day of surgery Patient aware to have Driver (ride ) / caregiver    for 24 hours after surgery -- daughter, Loyal Gambler Patient Special Instructions -----  n/a Pre-Op special Istructions -----  pt's primary language is [place] ([place]), daughter stated  pt does understand English pretty well but if could not understand something daughter would be with her to assist pt.  Daughter stated pt is in a lot of pain from vagina she is unable to walk or sleep she is in so much pain. Patient verbalized understanding of instructions that were given at this phone interview. Patient denies shortness of breath, chest pain, fever, cough at this phone interview.

## 2022-06-20 ENCOUNTER — Telehealth: Payer: Self-pay | Admitting: Gynecologic Oncology

## 2022-06-20 NOTE — Telephone Encounter (Signed)
Called patient's daughter and discussed with both patient and daughter findings on MRI. Discussed again plan for my portion of the surgery on Thursday (possible biopsies, possible excision vs laser ablation of the vulva).  Jeral Pinch MD Gynecologic Oncology

## 2022-06-21 ENCOUNTER — Telehealth: Payer: Self-pay

## 2022-06-21 NOTE — Telephone Encounter (Signed)
Telephone call to check on pre-operative status.  Patient compliant with pre-operative instructions.  Reinforced nothing to eat after midnight. Clear liquids until 12:00. Patient to arrive at 1:00.  No questions or concerns voiced.  Instructed to call for any needs.

## 2022-06-22 ENCOUNTER — Ambulatory Visit (HOSPITAL_BASED_OUTPATIENT_CLINIC_OR_DEPARTMENT_OTHER): Payer: Commercial Managed Care - HMO | Admitting: Anesthesiology

## 2022-06-22 ENCOUNTER — Encounter (HOSPITAL_BASED_OUTPATIENT_CLINIC_OR_DEPARTMENT_OTHER): Payer: Self-pay | Admitting: Gynecologic Oncology

## 2022-06-22 ENCOUNTER — Other Ambulatory Visit: Payer: Self-pay

## 2022-06-22 ENCOUNTER — Ambulatory Visit (HOSPITAL_BASED_OUTPATIENT_CLINIC_OR_DEPARTMENT_OTHER)
Admission: RE | Admit: 2022-06-22 | Discharge: 2022-06-22 | Disposition: A | Discharge status: Home / Self Care | Payer: Commercial Managed Care - HMO | Attending: Gynecologic Oncology | Admitting: Gynecologic Oncology

## 2022-06-22 ENCOUNTER — Encounter (HOSPITAL_BASED_OUTPATIENT_CLINIC_OR_DEPARTMENT_OTHER)
Admission: RE | Disposition: A | Discharge status: Home / Self Care | Payer: Self-pay | Source: Home / Self Care | Attending: Gynecologic Oncology

## 2022-06-22 DIAGNOSIS — Z79899 Other long term (current) drug therapy: Secondary | ICD-10-CM | POA: Diagnosis not present

## 2022-06-22 DIAGNOSIS — E669 Obesity, unspecified: Secondary | ICD-10-CM | POA: Diagnosis not present

## 2022-06-22 DIAGNOSIS — E119 Type 2 diabetes mellitus without complications: Secondary | ICD-10-CM | POA: Diagnosis not present

## 2022-06-22 DIAGNOSIS — Z6837 Body mass index (BMI) 37.0-37.9, adult: Secondary | ICD-10-CM | POA: Diagnosis not present

## 2022-06-22 DIAGNOSIS — Z7984 Long term (current) use of oral hypoglycemic drugs: Secondary | ICD-10-CM | POA: Insufficient documentation

## 2022-06-22 DIAGNOSIS — N765 Ulceration of vagina: Secondary | ICD-10-CM

## 2022-06-22 DIAGNOSIS — Q525 Fusion of labia: Secondary | ICD-10-CM

## 2022-06-22 DIAGNOSIS — I10 Essential (primary) hypertension: Secondary | ICD-10-CM | POA: Insufficient documentation

## 2022-06-22 DIAGNOSIS — C510 Malignant neoplasm of labium majus: Secondary | ICD-10-CM | POA: Insufficient documentation

## 2022-06-22 DIAGNOSIS — D071 Carcinoma in situ of vulva: Secondary | ICD-10-CM | POA: Diagnosis not present

## 2022-06-22 DIAGNOSIS — N9089 Other specified noninflammatory disorders of vulva and perineum: Secondary | ICD-10-CM | POA: Diagnosis not present

## 2022-06-22 DIAGNOSIS — N903 Dysplasia of vulva, unspecified: Secondary | ICD-10-CM | POA: Diagnosis not present

## 2022-06-22 DIAGNOSIS — R102 Pelvic and perineal pain: Secondary | ICD-10-CM

## 2022-06-22 DIAGNOSIS — Z01818 Encounter for other preprocedural examination: Secondary | ICD-10-CM

## 2022-06-22 HISTORY — DX: Other chronic pain: G89.29

## 2022-06-22 HISTORY — PX: GRAFT APPLICATION: SHX6696

## 2022-06-22 HISTORY — PX: VULVECTOMY: SHX1086

## 2022-06-22 HISTORY — PX: CYSTOSCOPY: SHX5120

## 2022-06-22 LAB — POCT I-STAT, CHEM 8
BUN: 12 mg/dL (ref 8–23)
Calcium, Ion: 1.28 mmol/L (ref 1.15–1.40)
Chloride: 102 mmol/L (ref 98–111)
Creatinine, Ser: 0.4 mg/dL — ABNORMAL LOW (ref 0.44–1.00)
Glucose, Bld: 110 mg/dL — ABNORMAL HIGH (ref 70–99)
HCT: 40 % (ref 36.0–46.0)
Hemoglobin: 13.6 g/dL (ref 12.0–15.0)
Potassium: 4.1 mmol/L (ref 3.5–5.1)
Sodium: 141 mmol/L (ref 135–145)
TCO2: 30 mmol/L (ref 22–32)

## 2022-06-22 SURGERY — EXAM UNDER ANESTHESIA
Anesthesia: General | Site: Vulva

## 2022-06-22 MED ORDER — DEXAMETHASONE SODIUM PHOSPHATE 4 MG/ML IJ SOLN
INTRAMUSCULAR | Status: DC | PRN
  Administered 2022-06-22: 5 mg via INTRAVENOUS

## 2022-06-22 MED ORDER — STERILE WATER FOR IRRIGATION IR SOLN
Status: DC | PRN
  Administered 2022-06-22: 3000 mL

## 2022-06-22 MED ORDER — PHENAZOPYRIDINE HCL 100 MG PO TABS
200.0000 mg | ORAL_TABLET | ORAL | Status: AC
  Administered 2022-06-22: 200 mg via ORAL

## 2022-06-22 MED ORDER — ACETAMINOPHEN 500 MG PO TABS
1000.0000 mg | ORAL_TABLET | ORAL | Status: AC
  Administered 2022-06-22: 1000 mg via ORAL

## 2022-06-22 MED ORDER — LIDOCAINE HCL (CARDIAC) PF 100 MG/5ML IV SOSY
PREFILLED_SYRINGE | INTRAVENOUS | Status: DC | PRN
  Administered 2022-06-22: 40 mg via INTRAVENOUS

## 2022-06-22 MED ORDER — PHENYLEPHRINE HCL (PRESSORS) 10 MG/ML IV SOLN
INTRAVENOUS | Status: DC | PRN
  Administered 2022-06-22 (×8): 80 ug via INTRAVENOUS

## 2022-06-22 MED ORDER — FENTANYL CITRATE (PF) 100 MCG/2ML IJ SOLN
INTRAMUSCULAR | Status: AC
  Filled 2022-06-22: qty 2

## 2022-06-22 MED ORDER — LACTATED RINGERS IV SOLN: INTRAVENOUS | Status: DC

## 2022-06-22 MED ORDER — OXYCODONE HCL 5 MG PO TABS: 5.0000 mg | ORAL_TABLET | Freq: Once | ORAL | Status: DC | PRN

## 2022-06-22 MED ORDER — MIDAZOLAM HCL 2 MG/2ML IJ SOLN
INTRAMUSCULAR | Status: AC
  Filled 2022-06-22: qty 2

## 2022-06-22 MED ORDER — FENTANYL CITRATE (PF) 100 MCG/2ML IJ SOLN: 25.0000 ug | INTRAMUSCULAR | Status: DC | PRN

## 2022-06-22 MED ORDER — KETOROLAC TROMETHAMINE 30 MG/ML IJ SOLN
INTRAMUSCULAR | Status: DC | PRN
  Administered 2022-06-22: 15 mg via INTRAVENOUS

## 2022-06-22 MED ORDER — FENTANYL CITRATE (PF) 100 MCG/2ML IJ SOLN
INTRAMUSCULAR | Status: DC | PRN
  Administered 2022-06-22 (×4): 50 ug via INTRAVENOUS

## 2022-06-22 MED ORDER — PROPOFOL 10 MG/ML IV BOLUS
INTRAVENOUS | Status: AC
  Filled 2022-06-22: qty 20

## 2022-06-22 MED ORDER — SODIUM CHLORIDE 0.9 % IR SOLN
Status: DC | PRN
  Administered 2022-06-22: 1000 mL

## 2022-06-22 MED ORDER — CEFAZOLIN SODIUM-DEXTROSE 2-4 GM/100ML-% IV SOLN
INTRAVENOUS | Status: AC
  Filled 2022-06-22: qty 100

## 2022-06-22 MED ORDER — DEXAMETHASONE SODIUM PHOSPHATE 10 MG/ML IJ SOLN: 4.0000 mg | INTRAMUSCULAR | Status: DC

## 2022-06-22 MED ORDER — CEFAZOLIN SODIUM-DEXTROSE 2-4 GM/100ML-% IV SOLN
2.0000 g | Freq: Three times a day (TID) | INTRAVENOUS | Status: DC
  Administered 2022-06-22: 2 g via INTRAVENOUS

## 2022-06-22 MED ORDER — PHENAZOPYRIDINE HCL 100 MG PO TABS
ORAL_TABLET | ORAL | Status: AC
  Filled 2022-06-22: qty 2

## 2022-06-22 MED ORDER — ACETAMINOPHEN 500 MG PO TABS
ORAL_TABLET | ORAL | Status: AC
  Filled 2022-06-22: qty 2

## 2022-06-22 MED ORDER — LIDOCAINE HCL (PF) 1 % IJ SOLN
INTRAMUSCULAR | Status: DC | PRN
  Administered 2022-06-22: 40 mL via SURGICAL_CAVITY

## 2022-06-22 MED ORDER — AMISULPRIDE (ANTIEMETIC) 5 MG/2ML IV SOLN: 10.0000 mg | Freq: Once | INTRAVENOUS | Status: DC | PRN

## 2022-06-22 MED ORDER — ONDANSETRON HCL 4 MG/2ML IJ SOLN
INTRAMUSCULAR | Status: DC | PRN
  Administered 2022-06-22: 4 mg via INTRAVENOUS

## 2022-06-22 MED ORDER — OXYCODONE HCL 5 MG/5ML PO SOLN: 5.0000 mg | Freq: Once | ORAL | Status: DC | PRN

## 2022-06-22 MED ORDER — LIDOCAINE-EPINEPHRINE 1 %-1:100000 IJ SOLN
INTRAMUSCULAR | Status: DC | PRN
  Administered 2022-06-22: 20 mL

## 2022-06-22 MED ORDER — MIDAZOLAM HCL 2 MG/2ML IJ SOLN
INTRAMUSCULAR | Status: DC | PRN
  Administered 2022-06-22: 2 mg via INTRAVENOUS

## 2022-06-22 MED ORDER — KETOROLAC TROMETHAMINE 30 MG/ML IJ SOLN
INTRAMUSCULAR | Status: AC
  Filled 2022-06-22: qty 2

## 2022-06-22 MED ORDER — STERILE WATER FOR IRRIGATION IR SOLN
Status: DC | PRN
  Administered 2022-06-22: 1000 mL

## 2022-06-22 MED ORDER — PROPOFOL 10 MG/ML IV BOLUS
INTRAVENOUS | Status: DC | PRN
  Administered 2022-06-22 (×3): 50 mg via INTRAVENOUS
  Administered 2022-06-22: 100 mg via INTRAVENOUS
  Administered 2022-06-22: 50 mg via INTRAVENOUS

## 2022-06-22 MED ORDER — PROMETHAZINE HCL 25 MG/ML IJ SOLN: 6.2500 mg | INTRAMUSCULAR | Status: DC | PRN

## 2022-06-22 SURGICAL SUPPLY — 39 items
ALLOGRAFT TIS AXIS TUTPLST 4X7 (Mesh General) IMPLANT
BLADE CLIPPER SENSICLIP SURGIC (BLADE) IMPLANT
BLADE SURG 15 STRL LF DISP TIS (BLADE) ×4 IMPLANT
BLADE SURG 15 STRL SS (BLADE) ×4
CATH FOLEY 2WAY  3CC 10FR (CATHETERS) ×4
CATH FOLEY 2WAY 3CC 10FR (CATHETERS) IMPLANT
DEPRESSOR TONGUE 6 IN STERILE (GAUZE/BANDAGES/DRESSINGS) ×4 IMPLANT
GAUZE 4X4 16PLY ~~LOC~~+RFID DBL (SPONGE) ×4 IMPLANT
GLOVE BIO SURGEON STRL SZ 6 (GLOVE) ×8 IMPLANT
GLOVE BIOGEL PI IND STRL 6.5 (GLOVE) IMPLANT
GLOVE SURG SS PI 6.5 STRL IVOR (GLOVE) IMPLANT
GLOVE SURG SS PI 7.5 STRL IVOR (GLOVE) IMPLANT
GOWN STRL REUS W/ TWL LRG LVL3 (GOWN DISPOSABLE) IMPLANT
GOWN STRL REUS W/TWL LRG LVL3 (GOWN DISPOSABLE) ×12 IMPLANT
IV NS 1000ML (IV SOLUTION) ×4
IV NS 1000ML BAXH (IV SOLUTION) IMPLANT
KIT TURNOVER CYSTO (KITS) ×4 IMPLANT
NDL HYPO 25X1 1.5 SAFETY (NEEDLE) ×4 IMPLANT
NDL SPNL 22GX7 QUINCKE BK (NEEDLE) IMPLANT
NEEDLE HYPO 25X1 1.5 SAFETY (NEEDLE) ×4 IMPLANT
NEEDLE SPNL 22GX7 QUINCKE BK (NEEDLE) ×4 IMPLANT
PACK PERINEAL COLD (PAD) ×4 IMPLANT
PACK VAGINAL WOMENS (CUSTOM PROCEDURE TRAY) ×4 IMPLANT
PAD PREP 24X48 CUFFED NSTRL (MISCELLANEOUS) ×4 IMPLANT
SLEEVE SCD COMPRESS KNEE MED (STOCKING) ×4 IMPLANT
SUT VIC AB 0 SH 27 (SUTURE) ×4 IMPLANT
SUT VIC AB 2-0 SH 27 (SUTURE) ×12
SUT VIC AB 2-0 SH 27XBRD (SUTURE) IMPLANT
SUT VIC AB 3-0 SH 27 (SUTURE) ×24
SUT VIC AB 3-0 SH 27X BRD (SUTURE) ×4 IMPLANT
SUT VIC AB 4-0 PS2 18 (SUTURE) ×4 IMPLANT
SWAB OB GYN 8IN STERILE 2PK (MISCELLANEOUS) ×8 IMPLANT
SYR BULB IRRIG 60ML STRL (SYRINGE) ×4 IMPLANT
SYR CONTROL 10ML LL (SYRINGE) IMPLANT
TISSUE AXIS TUTOPLAST 4X7 (Mesh General) ×4 IMPLANT
TOWEL OR 17X24 6PK STRL BLUE (TOWEL DISPOSABLE) ×8 IMPLANT
TRAY FOLEY W/BAG SLVR 14FR LF (SET/KITS/TRAYS/PACK) IMPLANT
WATER STERILE IRR 1000ML POUR (IV SOLUTION) IMPLANT
WATER STERILE IRR 3000ML UROMA (IV SOLUTION) IMPLANT

## 2022-06-22 NOTE — Interval H&P Note (Signed)
History and Physical Interval Note:  06/22/2022 3:28 PM  Ellen Ryan  has presented today for surgery, with the diagnosis of VULVAR DYSPLACIA.  The various methods of treatment have been discussed with the patient and family. After consideration of risks, benefits and other options for treatment, the patient has consented to  Procedure(s): PELVIC EXAM UNDER ANESTHESIA (N/A) VULVAR BIOPSY (N/A) POSSIBLE WIDE LOCAL EXCISION VULVECTOMY (N/A) POSSIBLE CO2 LASER APPLICATION (N/A) CYSTOSCOPY LABIAL LYSIS OF ADHESION-possible as a surgical intervention.  The patient's history has been reviewed, patient examined, no change in status, stable for surgery.  I have reviewed the patient's chart and labs.  Questions were answered to the patient's satisfaction.     Lafonda Mosses

## 2022-06-22 NOTE — Discharge Instructions (Addendum)
06/22/2022 **You may begin taking Ibuprofen(Motrin, Advil) Aleve or any other non-steroidal anti-inflammatory medicine after 11:18 pm today**  **You may begin taking Tylenol(Acetaminophen) after 7:32 pm today**    Use vaseline over the incisions 2-3 times per day.   Dr. Charisse March office will call you tomorrow. If you have used most or all of your oxycodone, please let us know during this phone call. We injected long acting numbing medication that should last for 3 days.  Activity: 1. Be up and out of the bed during the day.  Take a nap if needed.  You may walk up steps but be careful and use the hand rail.  Stair climbing will tire you more than you think, you may need to stop part way and rest.   2. No lifting or straining for 6 weeks.  3. Do Not drive if you are taking narcotic pain medicine.  4. Shower daily. No tub baths until you see Dr. [name].   Medications:  - Take ibuprofen and tylenol first line for pain control. Take these regularly (every 6 hours) to decrease the build up of pain.  - If necessary, for severe pain not relieved by ibuprofen, take oxycodone.  - While taking oxycodone you should take sennakot every night to reduce the likelihood of constipation. If this causes diarrhea, stop its use.  Diet: 1. Low sodium Heart Healthy Diet is recommended.  2. It is safe to use a laxative if you have difficulty moving your bowels.   Wound Care: 1. Keep clean and dry.  Shower daily.  Reasons to call the Doctor:  Fever - Oral temperature greater than 100.4 degrees Fahrenheit Foul-smelling vaginal discharge Nausea and vomiting Increased pain at the site of the incision that is unrelieved with pain medicine. Difficulty breathing with or without chest pain New calf pain especially if only on one side Sudden, continuing increased vaginal bleeding with or without clots.   Follow-up: 1. Drs. Berline Lopes and [employer] will call you with appointments. You will see Dr.  [name] next week - 3/7 at 8:00  Contacts: For questions or concerns you should contact:  Dr. Jeral Pinch at [phone] After hours and on week-ends call [phone] and ask to speak to the physician on call for Gynecologic Oncology   Post Anesthesia Home Care Instructions  Activity: Get plenty of rest for the remainder of the day. A responsible adult should stay with you for 24 hours following the procedure.  For the next 24 hours, DO NOT: -Drive a car -Paediatric nurse -Drink alcoholic beverages -Take any medication unless instructed by your physician -Make any legal decisions or sign important papers.  Meals: Start with liquid foods such as gelatin or soup. Progress to regular foods as tolerated. Avoid greasy, spicy, heavy foods. If nausea and/or vomiting occur, drink only clear liquids until the nausea and/or vomiting subsides. Call your physician if vomiting continues.  Special Instructions/Symptoms: Your throat may feel dry or sore from the anesthesia or the breathing tube placed in your throat during surgery. If this causes discomfort, gargle with warm salt water. The discomfort should disappear within 24 hours.    Information for Discharge Teaching: EXPAREL (bupivacaine liposome injectable suspension)   Your surgeon gave you EXPAREL(bupivacaine) in your surgical incision to help control your pain after surgery.  EXPAREL is a local anesthetic that provides pain relief by numbing the tissue around the surgical site. EXPAREL is designed to release pain medication over time and can control pain for up to  72 hours. Depending on how you respond to EXPAREL, you may require less pain medication during your recovery.  Possible side effects: Temporary loss of sensation or ability to move in the area where bupivacaine was injected. Nausea, vomiting, constipation Rarely, numbness and tingling in your mouth or lips, lightheadedness, or anxiety may occur. Call your doctor  right away if you think you may be experiencing any of these sensations, or if you have other questions regarding possible side effects.  Follow all other discharge instructions given to you by your surgeon or nurse. Eat a healthy diet and drink plenty of water or other fluids.  If you return to the hospital for any reason within 96 hours(Monday 06/26/22) following the administration of EXPAREL, please inform your health care providers.

## 2022-06-22 NOTE — Anesthesia Procedure Notes (Signed)
Procedure Name: LMA Insertion Date/Time: 06/22/2022 4:07 PM  Performed by: Justice Rocher, CRNAPre-anesthesia Checklist: Patient identified, Emergency Drugs available, Suction available, Patient being monitored and Timeout performed Patient Re-evaluated:Patient Re-evaluated prior to induction Oxygen Delivery Method: Circle system utilized Preoxygenation: Pre-oxygenation with 100% oxygen Induction Type: IV induction Ventilation: Mask ventilation without difficulty LMA: LMA inserted LMA Size: 4.0 Number of attempts: 3 Airway Equipment and Method: Bite block Placement Confirmation: positive ETCO2, breath sounds checked- equal and bilateral and CO2 detector Tube secured with: Tape Dental Injury: Teeth and Oropharynx as per pre-operative assessment  Comments: Mask ventilated easily - LMA placed and required repositioning for best seal - per Dr Lanetta Inch

## 2022-06-22 NOTE — H&P (Signed)
[facility] Pre-Operative H&P  Subjective Chief Complaint: Ellen Ryan presents for a preoperative encounter.   History of Present Illness: Ellen Ryan is a 67 y.o. female who presents for preoperative visit.  She is scheduled to undergo possible labial lysis of adhesions and cystoscopy on 06/22/22.  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 On prior exams, labial adhesions occluded the urethral meatus.   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[place])    extramammary-- s/p complete vulvectomy 09-21-2016   Type 2 diabetes mellitus ([place])    Uterine fibroid    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     Past Surgical History:  Procedure Laterality Date   CO2 LASER APPLICATION N/A [id number]   Procedure: CO2 LASER APPLICATION OF THE VULVA;  Surgeon: Everitt Amber, MD;  Location: [facility];  Service: Gynecology;  Laterality: N/A;   CO2 LASER APPLICATION N/A [id number]   Procedure: VAGINOSCOPY, VULVAR BIOPSIES;  Surgeon: Lafonda Mosses, MD;  Location: [facility];  Service: Gynecology;  Laterality: N/A;   EUA/ VULVA BIOPSY  09-01-2016  [name]  [place] N/A 09/21/2016   Procedure: RADICAL VULVECTOMY COMPLETE;  Surgeon: Everitt Amber, MD;  Location: [facility];  Service: Gynecology;  Laterality: N/A;   TRANSTHORACIC ECHOCARDIOGRAM  01/16/2017   ef 60-65%,  grade 1 diastolic dysfunction/  very mild AV stenosis with moderately thickened and calcified leaflets (valve area 1.85cm^2)/  moderate LAE   VULVA /PERINEUM BIOPSY  N/A 09/13/2017   Procedure: VULVAR BIOPSY;  Surgeon: Everitt Amber, MD;  Location: [facility];  Service: Gynecology;  Laterality: N/A;   VULVA Milagros Loll BIOPSY N/A 08/31/2021   Procedure: VULVAR BIOPSY;  Surgeon: Lahoma Crocker, MD;  Location: [facility];  Service: Gynecology;  Laterality: N/A;    has No Known Allergies.   Family History  Problem Relation Age of Onset   Stroke Mother    Arthritis Mother    Diabetes Father    Cancer Daughter     Social History   Tobacco Use   Smoking status: Never   Smokeless tobacco: Never  Vaping Use   Vaping Use: Never used  Substance Use Topics   Alcohol use: Not Currently   Drug use: No     Review of Systems was negative for a full 10 system review except as noted in the History of Present Illness.   Current Facility-Administered Medications:    ceFAZolin (ANCEF) IVPB 2g/100 mL premix, 2 g, Intravenous, Q8H, Jaquita Folds, MD   [START ON 06/23/2022] dexamethasone (DECADRON) injection 4 mg, 4 mg, Intravenous, On Call to OR, Cross, Lenna Sciara D, NP   lactated ringers infusion, , Intravenous, Continuous, Ellender, Karyl Kinnier, MD, Last Rate: 50 mL/hr at 06/22/22 1345, New Bag at 06/22/22 1345   Objective Vitals:   06/22/22 1323  BP: (!) 149/85  Pulse: (!) 110  Resp: 18  Temp: 98 F (36.7  C)  SpO2: 97%    Gen: NAD CV: S1 S2 RRR Lungs: Clear to auscultation bilaterally Abd: soft, nontender    Assessment/ Plan  The patient is a 67 y.o. year old scheduled to undergo possible labial lysis of adhesions and cystoscopy. Consent was obtained for these procedures.  Jaquita Folds, MD

## 2022-06-22 NOTE — Anesthesia Preprocedure Evaluation (Addendum)
Anesthesia Evaluation  Patient identified by MRN, date of birth, ID band Patient awake    Reviewed: Allergy & Precautions, NPO status , Patient's Chart, lab work & pertinent test results  Airway Mallampati: II  TM Distance: >3 FB Neck ROM: Full    Dental  (+) Dental Advisory Given, Teeth Intact   Pulmonary neg pulmonary ROS   Pulmonary exam normal breath sounds clear to auscultation       Cardiovascular hypertension, Pt. on medications  Rhythm:Regular Rate:Normal + Systolic murmurs TTE [id number] - Left ventricle: The cavity size was normal. Systolic function was    normal. The estimated ejection fraction was in the range of 60%    to 65%. Wall motion was normal; there were no regional wall    motion abnormalities. Doppler parameters are consistent with    abnormal left ventricular relaxation (grade 1 diastolic    dysfunction).  - Aortic valve: Trileaflet; moderately thickened, moderately    calcified leaflets. Valve mobility was restricted. There was very    mild stenosis. Peak velocity (S): 222 cm/s. Mean gradient (S): 11    mm Hg. Valve area (VTI): 1.85 cm^2. Valve area (Vmax): 1.65 cm^2.  - Mitral valve: Calcified annulus.  - Left atrium: The atrium was moderately dilated.   Neuro/Psych negative neurological ROS  negative psych ROS   GI/Hepatic negative GI ROS, Neg liver ROS,,,  Endo/Other  diabetes, Type 2, Oral Hypoglycemic Agents  Obese BMI 37  Renal/GU negative Renal ROS  negative genitourinary   Musculoskeletal  (+) Arthritis ,    Abdominal  (+) + obese  Peds  Hematology negative hematology ROS (+)   Anesthesia Other Findings Paget's disease of vulva  Reproductive/Obstetrics                              Anesthesia Physical Anesthesia Plan  ASA: 3  Anesthesia Plan: General   Post-op Pain Management: Tylenol PO (pre-op)*   Induction: Intravenous  PONV Risk Score and Plan: 3  and Treatment may vary due to age or medical condition, Midazolam, Ondansetron and Dexamethasone  Airway Management Planned: LMA  Additional Equipment:   Intra-op Plan:   Post-operative Plan: Extubation in OR  Informed Consent: I have reviewed the patients History and Physical, chart, labs and discussed the procedure including the risks, benefits and alternatives for the proposed anesthesia with the patient or authorized representative who has indicated his/her understanding and acceptance.     Dental advisory given  Plan Discussed with: CRNA  Anesthesia Plan Comments:          Anesthesia Quick Evaluation

## 2022-06-22 NOTE — Transfer of Care (Signed)
Immediate Anesthesia Transfer of Care Note  Patient: Ellen Ryan  Procedure(s) Performed: PELVIC EXAM UNDER ANESTHESIA (Vulva) WIDE LOCAL EXCISION VULVECTOMY CYSTOSCOPY with BLADDER BIOPSY (Urethra) GRAFT APPLICATION  Patient Location: PACU  Anesthesia Type:General  Level of Consciousness: awake, alert  and patient cooperative  Airway & Oxygen Therapy: Patient Spontanous Breathing and Patient connected to nasal cannula oxygen  Post-op Assessment: Report given to RN and Post -op Vital signs reviewed and stable  Post vital signs: Reviewed and stable  Last Vitals:  Vitals Value Taken Time  BP 140/67 06/22/22 1816  Temp    Pulse 84 06/22/22 1821  Resp 23 06/22/22 1821  SpO2 97 % 06/22/22 1821  Vitals shown include unvalidated device data.  Last Pain:  Vitals:   06/22/22 1323  TempSrc: Oral  PainSc: 8       Patients Stated Pain Goal: 2 ([id number])  Complications: No notable events documented.

## 2022-06-22 NOTE — Op Note (Signed)
Operative Report  PATIENT: Ellen Ryan  ENCOUNTER DATE: 06/22/22  Preop Diagnosis: History of Paget's disease, VIN3 on biopsy in 2023, labial agglutination, pelvic/vulvar pain  Postoperative Diagnosis: same as above  Surgery: Complete simple vulvectomy, lysis of labial adhesions Berline Lopes); cystoscopy with bladder biopsy, vaginal graft placement, pudendal block [name])  Surgeons:  Jeral Pinch, MD  Co-surgeon: Sherlene Shams, MD (a co-surgeon was required given the complexity of this case and procedures performed)  Anesthesia: General   Estimated blood loss: 75 ml  IVF:  see I&O flowsheet   Urine output: [id number] cc  Complications: None apparent  Pathology: Vulva with marking stitch at 12 o'clock  Operative findings: Vulva notable for a 7 x 5 area of ulcerated appearing tissue, friable in places, covering agglutinated vagina. No discernable vagina with pinpoint hole towards inferior aspect of ulcerated and beefy red tissue where urine seen intermittently. After simple vulvectomy of the entire erythematous and ulcerated tissue, gentle blunt dissection ultimately led to identification of distal vagina and urethral opening. On cystoscopy, some hypertrophy of the bladder mucosa noted.         Procedure: The patient was identified in the preoperative holding area. Informed consent was signed on the chart. Patient was seen history was reviewed and exam was performed.   The patient was then taken to the operating room and placed in the supine position with SCD hose on. General anesthesia was then induced without difficulty. She was then placed in the dorsolithotomy position. The perineum was prepped with Betadine. The vulva was prepped with Betadine. The patient was then draped after the prep was dried.   Timeout was performed the patient, procedure, antibiotic, allergy, and length of procedure. The vulvar tissues were inspected with picture above of preoperative findings.  The lesion was identified. The 15 blade scalpel was used to make an incision through the skin circumferentially of the visible lesion. The skin was grasped and was separated from the underlying deep dermal tissues with the bovie device. After the specimen had been completely resected, it was oriented and marked at 12 o'clock with a 0-vicryl suture. The bovie was used to obtain hemostasis at the surgical bed. The subcutaneous tissues were irrigated and made hemostatic.   A combination of blunt dissection and Hegar dilators were used to slowly lyse labial adhesions until an opening was appreciated and urethral tissue identified. At this point, the urethral was cleansed with betadine and a foley catheter was placed with urine noted.   Please see Dr. Tommas Olp note for cystoscopy and bladder biopsy.  Attention was then turned anterior to the urethral. Monopolar electrocautery was used to incise this tissue along the midline. The subcutaneous tissue along the inferior and superior aspect of the incision was then undermined bilaterally to help with mobilization for closure and assure a tension free closure.   2-0 Vicryl sutures were used to suture the urethral meatus to the superficial subcutaneous tissue superiorly and on each side of the urethra. The deep dermal layer of the incision on each side was approximated with 2-0 vicryl mattress sutures to bring the skin edges into approximation and off tension. The cutaneous layer was closed with interrupted 4-0 vicryl stitches and mattress sutures to ensure a tension free and hemostatic closure. The perineum was again irrigated.   Given defect along the posterior aspect of the newly made vaginal opening, decision was made to place a graft to facilitate wound healing. See Dr. Tommas Olp note for details.  20 cc of Exparel was injected for  anesthesia at the end of surgery.  Multiple rectal exams were performed during the surgery to assure no injury to or  stitches in the rectum.  All instrument, suture, laparotomy, Ray-Tec, and needle counts were correct x2. The patient tolerated the procedure well and was taken recovery room in stable condition.   Jeral Pinch MD Gynecologic Oncology

## 2022-06-22 NOTE — Op Note (Addendum)
Operative Note  Preoperative Diagnosis:  Vulvar dysplasia with labial adhesions  Postoperative Diagnosis: same  Procedures performed:  Partial simple vulvectomy, labial lysis of adhesions, cystoscopy with biopsy, insertion of Axis dermis graft, bilateral pudendal nerve block  Implants:  Implant Name Type Inv. Item Serial No. Manufacturer Lot No. LRB No. Used Action  TISSUE AXIS TUTOPLAST 4X7 [id number] Mesh General TISSUE AXIS TUTOPLAST 4X7  COLOPLAST [id number] N/A 1 Implanted    Attending Surgeon: Sherlene Shams, MD  Co- Surgeon: Jeral Pinch, MD (A co-surgeon was required due to the complexity of the case)  Anesthesia: General LMA  Findings: 1. Complete fusion of the vulva over the urethral meatus and vagina. Obliterated vagina.   2. On cystoscopy, normal urethral tissue, Baldder mucosa in the trigone with inflammation and edema, otherwise normal appearing mucosa. Brisk bilateral ureteral efflux noted with the assistance of pyridium.   Specimens:  ID Type Source Tests Collected by Time Destination  1 : vulva, stitched at 12 Tissue PATH Gyn biopsy SURGICAL PATHOLOGY Lafonda Mosses, MD 06/22/2022 1638   2 : bladder mucosa Tissue PATH GU biopsy SURGICAL PATHOLOGY Lafonda Mosses, MD 06/22/2022 1705     Estimated blood loss: 75 mL  IV fluids: 1200 mL  Urine output: [id number] mL  Complications: none  Procedure in Detail:  After informed consent was obtained, the patient was taken to the operating room where general anesthesia was induced and found to be adequate. She was placed in dorsal lithotomy position, taking care to avoid any nerve injury, and prepped and draped in the usual sterile fashion.   Along with Dr Berline Lopes, a vulvectomy was performed and labial lysis of adhesions. Please see her operative note for details. Once the urethra was identified, a 10 F foley catheter was easily inserted and urine was drained. This was removed and a 89F catheter was able to be  placed. The catheter was removed and a cystoscopy was performed with a 60F 70 degree cystoscope was placed through the urethra. The ureteral orifices were noted and good ureteral jets were present. The mucosa in the trigone appeared edematous while the remainder of the bladder mucosa appeared normal. A cold cup biopsy was performed in the midline trigone. Bugbee was used to obtain hemostasis. The cystoscope was then removed and the foley catheter was replaced.   The vulvar tissues were then closed. See Dr Charisse March operative note. At the end of the closure, there was a skin defect over the perineum up to the vaginal opening. Decision was made to place an Axis dermis graft over this area and it was held in place with interrupted 3-0 vicryl suture along the periphery and one suture at the base of the graft. Irrigation was performed and hemostasis was noted.   A pudendal nerve block was performed by inserting a finger into the rectum and palpating the ischial spines. A spinal needle was introduced transperineally and 10cc of 1% lidocaine with epinephrine was injected medial and inferior to the right spine. This was repeated on the left side. The needle was removed. The patient tolerated the procedure well and she was awakened and taken to the recovery room in stable condition. Needle and sponge count was correct x2.   Jaquita Folds, MD

## 2022-06-23 ENCOUNTER — Telehealth: Payer: Self-pay

## 2022-06-23 ENCOUNTER — Encounter (HOSPITAL_BASED_OUTPATIENT_CLINIC_OR_DEPARTMENT_OTHER): Payer: Self-pay | Admitting: Gynecologic Oncology

## 2022-06-23 ENCOUNTER — Other Ambulatory Visit: Payer: Self-pay | Admitting: Gynecologic Oncology

## 2022-06-23 DIAGNOSIS — R102 Pelvic and perineal pain: Secondary | ICD-10-CM

## 2022-06-23 DIAGNOSIS — G8918 Other acute postprocedural pain: Secondary | ICD-10-CM

## 2022-06-23 DIAGNOSIS — D071 Carcinoma in situ of vulva: Secondary | ICD-10-CM

## 2022-06-23 MED ORDER — OXYCODONE HCL 5 MG PO TABS: 5.0000 mg | ORAL_TABLET | ORAL | 0 refills | Status: DC | PRN

## 2022-06-23 NOTE — Telephone Encounter (Addendum)
Spoke with Ellen Ryan, pt's daughtrer. She states she is eating, drinking and urinating well (has catheter). She has not had a BM yet but is passing gas. She is taking senokot as prescribed and encouraged her to drink plenty of water. She denies fever or chills. Incisions are dry and intact. She rates her pain 2/10 on pain scale. Her pain is controlled with Tylenol/Ibuprofen. She states she has 4 Oxycodone left from previous prescription. I advised Ellen Ryan to call Dr. Tommas Olp office to schedule an appointment them for next week. She voiced an understanding. She is aware I will notify Dr. Berline Lopes of needing refill on medication  Instructed to call office with any fever, chills, purulent drainage, uncontrolled pain or any other questions or concerns. Patient verbalizes understanding.   Pt aware of post op appointments as well as the office number [phone] and after hours number [phone] to call if she has any questions or concerns

## 2022-06-24 NOTE — Anesthesia Postprocedure Evaluation (Signed)
Anesthesia Post Note  Patient: Ellen Ryan  Procedure(s) Performed: PELVIC EXAM UNDER ANESTHESIA (Vulva) WIDE LOCAL EXCISION VULVECTOMY CYSTOSCOPY with BLADDER BIOPSY (Urethra) GRAFT APPLICATION     Patient location during evaluation: PACU Anesthesia Type: General Level of consciousness: awake and alert Pain management: pain level controlled Vital Signs Assessment: post-procedure vital signs reviewed and stable Respiratory status: spontaneous breathing, nonlabored ventilation, respiratory function stable and patient connected to nasal cannula oxygen Cardiovascular status: blood pressure returned to baseline and stable Postop Assessment: no apparent nausea or vomiting Anesthetic complications: no  No notable events documented.  Last Vitals:  Vitals:   06/22/22 1900 06/22/22 1931  BP: 135/70 133/66  Pulse: 88 86  Resp: 19 20  Temp: 36.7 C 37.2 C  SpO2: 93% 97%    Last Pain:  Vitals:   06/23/22 1044  TempSrc:   PainSc: 0-No pain   Pain Goal: Patients Stated Pain Goal: 2 (06/22/22 1323)                 Shelton Soler L Lexie Morini

## 2022-06-26 LAB — SURGICAL PATHOLOGY

## 2022-06-29 ENCOUNTER — Encounter: Payer: Self-pay | Admitting: Obstetrics and Gynecology

## 2022-06-29 ENCOUNTER — Ambulatory Visit (INDEPENDENT_AMBULATORY_CARE_PROVIDER_SITE_OTHER): Payer: Commercial Managed Care - HMO | Admitting: Obstetrics and Gynecology

## 2022-06-29 ENCOUNTER — Telehealth: Payer: Self-pay | Admitting: *Deleted

## 2022-06-29 VITALS — BP 147/77 | HR 98

## 2022-06-29 DIAGNOSIS — Z9889 Other specified postprocedural states: Secondary | ICD-10-CM

## 2022-06-29 NOTE — Telephone Encounter (Signed)
Spoke with the patient's daughter and gave appt on 4/5 at 1 pm

## 2022-06-29 NOTE — Progress Notes (Signed)
[facility]  Date of Visit: 06/29/2022  History of Present Illness: Ms. Krason is a 67 y.o. female scheduled today for a post-operative visit.   Surgery: s/p Partial simple vulvectomy, labial lysis of adhesions, cystoscopy with biopsy, insertion of Axis dermis graft, bilateral pudendal nerve block on 06/22/22 with Dr Berline Lopes (GYN Onc)  She has a catheter in place currently. She was given a refill of oxycodone on 06/23/22 by Oncology team. Patient reports that she feels a lot better since the surgery. Pain has been tolerable. She no longer feels the pinching and crawling sensation and is able to sit down better. She has been using vaseline over her incisions 3 times per day. She rinses the area with water in the morning and at night. Has had some constipation and sees a little blood with small BM.    Pathology results: VULVA, WIDE LOCAL EXCISION:  - High-grade squamous intraepithelial lesion (VIN3, carcinoma in situ)  with prominent extension into underlying glands/adnexal structures  - No definitive evidence of invasive carcinoma but focal superficial  invasion cannot be entirely ruled out  - High-grade dysplasia involves the 12-2 o'clock margin; other margins  are negative   B. BLADDER BIOPSY:  - Bladder wall with ulceration  - Negative for dysplasia or malignancy   Medications: She has a current medication list which includes the following prescription(s): acetaminophen, amlodipine, atorvastatin, blood glucose meter kit and supplies, true metrix meter, gabapentin, freestyle lite, ibuprofen, freestyle, melatonin, metformin, methocarbamol, multivitamin with minerals, NONFORMULARY OR COMPOUNDED ITEM, oxycodone, polyethylene glycol, and senna.   Allergies: Patient has No Known Allergies.   Physical Exam: BP (!) 148/84   Pulse 99    Pelvic Examination: Catheter in place. Incisions intact and healing well. No erythema.  Graft appears intact, small amount of yellow discharge present  over the graft.   --------------------------------------------------  Assessment and Plan:  1. Post-operative state    - Reviewed images with her and her daughter from before and after surgery and discussed what was done.  - We reviewed that bladder pathology was normal. We also discussed that the vulvar tissue did show abnormal cells but will wait for Dr Berline Lopes to return to discuss further and potential need for monitoring or further treatment.  - Will continue catheter another week to help keep urethral area open while tissue continues to heal. She will come next week for a voiding trial.  - Other incisions and graft healing well. Continue with vaseline 3x per day. Next week will add clobetasol once a day.  - She will check with the pharmacy for the oxycodone prescription because they have not picked it up yet.   Return 1 week  Jaquita Folds, MD

## 2022-07-04 ENCOUNTER — Telehealth: Payer: Self-pay | Admitting: Gynecologic Oncology

## 2022-07-04 NOTE — Telephone Encounter (Signed)
Called and spoke with the patient and her daughter.  The patient is feeling much better, denies any pain.  They are having some issues with the catheter detaching from the bag.  They have been changing between the large bag and the leg bag.  I suggested that they keep the large bag as I think changing the bag so frequently has contributed to the catheter not staying attached to the tubing itself.  From surgeries which shows VIN 3.  We discussed 1 positive margin (from 12-2 o'c), which we will plan to follow closely.  Pathology revealed no evidence of invasive cancer.  Jeral Pinch MD Gynecologic Oncology

## 2022-07-07 ENCOUNTER — Ambulatory Visit (INDEPENDENT_AMBULATORY_CARE_PROVIDER_SITE_OTHER): Payer: Commercial Managed Care - HMO | Admitting: Obstetrics and Gynecology

## 2022-07-07 VITALS — BP 119/72 | HR 88

## 2022-07-07 DIAGNOSIS — N9089 Other specified noninflammatory disorders of vulva and perineum: Secondary | ICD-10-CM

## 2022-07-07 MED ORDER — CLOBETASOL PROP EMOLLIENT BASE 0.05 % EX CREA: TOPICAL_CREAM | CUTANEOUS | 11 refills | Status: DC

## 2022-07-07 NOTE — Progress Notes (Unsigned)
Ellen Ryan is a 67 y.o. female  here for a voiding trial.  12 fr cath removed with no complications.  Instilled: 279ml  Voided: 139ml Patient spilled out some water during the void and trial PVR: 53ml

## 2022-07-07 NOTE — Progress Notes (Signed)
Ellen Ryan  Date of Visit: 07/07/2022  History of Present Illness: Ellen Ryan is a 67 y.o. female scheduled today for a post-operative visit.   Surgery: s/p Partial simple vulvectomy, labial lysis of adhesions, cystoscopy with biopsy, insertion of Axis dermis graft, bilateral pudendal nerve block on 06/22/22 with Dr [name] (GYN Onc)  She has a catheter in place currently. Had some issues with the catheter leaking with smaller leg bag but no issued with larger bag. Pain has still been well controlled overall. She has been using vaseline 3x daily on incisions. Has noticed a few spots of blood with wiping.    Medications: She has a current medication list which includes the following prescription(s): acetaminophen, amlodipine, atorvastatin, blood glucose meter kit and supplies, true metrix meter, clobetasol prop emollient base, gabapentin, freestyle lite, ibuprofen, freestyle, melatonin, metformin, methocarbamol, multivitamin with minerals, NONFORMULARY OR COMPOUNDED ITEM, oxycodone, polyethylene glycol, and senna.   Allergies: Patient has No Known Allergies.   Physical Exam: BP 119/72   Pulse 88    Pelvic Examination: Catheter in place. Incisions intact and healing well. No erythema.  Graft appears intact, healing well.   --------------------------------------------------  Assessment and Plan:  1. Vulvar adhesions    - Voiding trial performed today, passed - Start clobetasol on incisions, particularly around the urethra once daily. Then also continue with vaseline 3x per day.    Return 2 weeks for incision check  Marguerita Beards, MD

## 2022-07-10 ENCOUNTER — Encounter: Payer: Self-pay | Admitting: Obstetrics and Gynecology

## 2022-07-14 ENCOUNTER — Encounter: Payer: Self-pay | Admitting: Physical Medicine & Rehabilitation

## 2022-07-14 ENCOUNTER — Encounter
Payer: Commercial Managed Care - HMO | Attending: Physical Medicine & Rehabilitation | Admitting: Physical Medicine & Rehabilitation

## 2022-07-14 VITALS — BP 132/86 | HR 86 | Wt 169.6 lb

## 2022-07-14 DIAGNOSIS — G5602 Carpal tunnel syndrome, left upper limb: Secondary | ICD-10-CM | POA: Insufficient documentation

## 2022-07-14 DIAGNOSIS — M25561 Pain in right knee: Secondary | ICD-10-CM | POA: Diagnosis present

## 2022-07-14 DIAGNOSIS — R102 Pelvic and perineal pain: Secondary | ICD-10-CM | POA: Diagnosis present

## 2022-07-14 DIAGNOSIS — G8929 Other chronic pain: Secondary | ICD-10-CM | POA: Diagnosis present

## 2022-07-14 MED ORDER — DICLOFENAC SODIUM 1 % EX GEL: 4.0000 g | Freq: Four times a day (QID) | CUTANEOUS | 5 refills | Status: DC

## 2022-07-14 MED ORDER — DICLOFENAC SODIUM 1 % EX GEL: 4.0000 g | Freq: Four times a day (QID) | CUTANEOUS | Status: DC

## 2022-07-14 MED ORDER — PREGABALIN 75 MG PO CAPS: 75.0000 mg | ORAL_CAPSULE | Freq: Two times a day (BID) | ORAL | 2 refills | Status: DC

## 2022-07-14 NOTE — Progress Notes (Signed)
Subjective:    Patient ID: Ellen Ryan, female    DOB: 12-Jan-1956, 67 y.o.   MRN: [id number]  HPI   HPI  Ellen Ryan is a 67 y.o. 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   They are presenting to PM&R clinic as a new patient for pain management evaluation.  She reports that she first developed pain around her pain while in 2013.  She was noted as having vulvar ulceration and biopsies.  She had a  vulvectomy 09/21/2016.  She reports she was doing well for about 2 years but then developed worsening pain around the area of her prior surgery.  The pain is gradually worsening and she was having difficulty even sitting due to severe pain.  She was diagnosed with VIN3 in 2023. I she also felt like something was moving or crawling inside of her.  She reports that she now knows this was related to the flow of urine around this area.  She was having difficulty with urination at the time.  She completed physical therapy without much benefit.   She was previously prescribed tramadol, hydrocodone, oxycodone.  She is also taking gabapentin and uses a compounded cream.  Patient reports that gabapentin did help her pain however she did not want to keep taking it and it caused mild GI discomfort.  Oxycodone helps her pain the most. She had surgery on 07/21/2022 where she had a pudendal block, vaginal graft, lysis of adhesions, cystoscopy, complete simple vulvectomy, lysis of adhesions.  Since this time her pain has been dramatically improved.  She reports Foley was removed about a week ago and she is able to void.  Since this time her pain is dramatically improved.  Her pain is reduced from an 8-10 out of 10 to a 3-4 out of 10.  While she has used oxycodone since  this time she says she mainly needs oxycodone the pain is severe at 8 out of 10.  Since her surgery her pain has been much better controlled.  She reports she would like to avoid taking a lot of medications. When asked about altered sensation she reports she has altered sensation in the fingertips of digits 1 through 4 on her left hand.  This sensation is sometimes increased at night.  She also has occasional right knee pain.  Medications tried: Gabapentin- stomach felt swelling - helped it calm down Oxycodone- taking this regularly Robaxin- didn't help much  Compounded cream helps  Tylenol- stopped because she was taking it too long  Ibuprofen- helps but causes stomach upset Hydrocodone- Itch  Other treatments: PT/OT- didn't help much   Goals for pain control: difficult to ascertain today  Prior UDS results: No results found for: "LABOPIA", "COCAINSCRNUR", "LABBENZ", "AMPHETMU", "THCU", "LABBARB"    Pain Inventory Average Pain 4 Pain Right Now 4 My pain is aching  In the last 24 hours, has pain interfered with the following? General activity 3 Relation with others 0 Enjoyment of life 1 What TIME of day is your pain at its worst? night Sleep (in general) NA  Pain is worse with: bending and some activites Pain improves with: medication Relief from Meds: 5  use a cane ability to climb steps?  yes do you drive?  no  disabled: date disabled .  I need assistance with the following:  household duties  bladder control problems  Any changes since last visit?  no  Any changes since last visit?  no    Family History  Problem Relation Age of Onset   Stroke Mother    Arthritis Mother    Diabetes Father    Cancer Daughter    Social History   Socioeconomic History   Marital status: Widowed    Spouse name: Not on file   Number of children: Not on file   Years of education: Not on file   Highest education level: Not on file  Occupational History   Not on file   Tobacco Use   Smoking status: Never   Smokeless tobacco: Never  Vaping Use   Vaping Use: Never used  Substance and Sexual Activity   Alcohol use: Not Currently   Drug use: No   Sexual activity: Not Currently    Birth control/protection: Post-menopausal  Other Topics Concern   Not on file  Social History Narrative   Not on file   Social Determinants of Health   Financial Resource Strain: Not on file  Food Insecurity: Not on file  Transportation Needs: Not on file  Physical Activity: Not on file  Stress: Not on file  Social Connections: Not on file   Past Surgical History:  Procedure Laterality Date   CO2 LASER APPLICATION N/A 123456   Procedure: CO2 LASER APPLICATION OF THE VULVA;  Surgeon: Everitt Amber, MD;  Location: [facility];  Service: Gynecology;  Laterality: N/A;   CO2 LASER APPLICATION N/A [id number]   Procedure: VAGINOSCOPY, VULVAR BIOPSIES;  Surgeon: Lafonda Mosses, MD;  Location: [facility];  Service: Gynecology;  Laterality: N/A;   CYSTOSCOPY  06/22/2022   Procedure: CYSTOSCOPY with BLADDER BIOPSY;  Surgeon: Jaquita Folds, MD;  Location: [facility];  Service: Gynecology;;   EUA/ VULVA BIOPSY  09-01-2016  dr dove  [place]  [id number]   Procedure: GRAFT APPLICATION;  Surgeon: Jaquita Folds, MD;  Location: [facility];  Service: Gynecology;;   RADICAL VULVECTOMY N/A 09/21/2016   Procedure: RADICAL VULVECTOMY COMPLETE;  Surgeon: Everitt Amber, MD;  Location: [facility];  Service: Gynecology;  Laterality: N/A;   TRANSTHORACIC ECHOCARDIOGRAM  01/16/2017   ef 60-65%,  grade 1 diastolic dysfunction/  very mild AV stenosis with moderately thickened and calcified leaflets (valve area 1.85cm^2)/  moderate LAE   VULVA /PERINEUM BIOPSY N/A 09/13/2017   Procedure: VULVAR BIOPSY;  Surgeon: Everitt Amber, MD;  Location: [facility];  Service: Gynecology;  Laterality: N/A;    VULVA Milagros Loll BIOPSY N/A 08/31/2021   Procedure: VULVAR BIOPSY;  Surgeon: Lahoma Crocker, MD;  Location: [facility];  Service: Gynecology;  Laterality: N/A;   VULVECTOMY N/A 06/22/2022   Procedure: WIDE LOCAL EXCISION VULVECTOMY;  Surgeon: Lafonda Mosses, MD;  Location: [facility];  Service: Gynecology;  Laterality: N/A;   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/ 2023;   recurrent 02/ 2024   Hyperlipidemia  Hypertension    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[place])    extramammary-- s/p complete vulvectomy 09-21-2016   Type 2 diabetes mellitus ([facility])    Uterine fibroid    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   BP 132/86   Pulse 86   Wt 169 lb 9.6 oz (76.9 kg)   SpO2 93%   BMI 35.45 kg/m   Opioid Risk Score:   Fall Risk Score:  `1  Depression screen [facility] 2/9     07/14/2022    2:46 PM 06/29/2021    9:57 AM 07/01/2018    4:05 PM 11/29/2017    2:45 PM 05/02/2017   10:44 AM 05/02/2017    9:33 AM 12/27/2016   11:58 AM  Depression screen PHQ 2/9  Decreased Interest 1 0 0 1 3 3  0  Down, Depressed, Hopeless 0 0 0 0 0 0 0  PHQ - 2 Score 1 0 0 1 3 3  0  Altered sleeping 0  0 0 0 0   Tired, decreased energy 0  0 0 0 0   Change in appetite 0  0 0 0 0   Feeling bad or failure about yourself  0  0 0 0 0   Trouble concentrating 0  3 1 3 3    Moving slowly or fidgety/restless 0  0 0 0 0   Suicidal thoughts 0  0 0 0 0   PHQ-9 Score 1  3 2 6 6      Review of Systems  Constitutional: Negative.   HENT: Negative.    Eyes: Negative.   Respiratory: Negative.    Cardiovascular: Negative.   Gastrointestinal: Negative.   Endocrine: Negative.   Genitourinary:  Positive for difficulty urinating and  dysuria.  Musculoskeletal: Negative.   Skin: Negative.   Allergic/Immunologic: Negative.   Neurological: Negative.   Hematological: Negative.   Psychiatric/Behavioral: Negative.    All other systems reviewed and are negative.      Objective:   Physical Exam  Gen: no distress, normal appearing HEENT: oral mucosa pink and moist, NCAT Cardio: Reg rate Chest: normal effort, normal rate of breathing Abd: soft, non-distended Ext: no edema Psych: pleasant, normal affect Skin: intact Neuro: Alert and awake, follows commands, CN 2-12 grossly intact Strength 5/5 in b/l UE and LE Sensation 5/5 in  Tinels and Phalens test negative b/l Sensation intact to LT in all 4 extremities- altered in L distal digits 1-4 DTR normal and symmetric b/l UE and LE No ankle clonus Musculoskeletal:  Mild R knee joint line tenderness, no pain varus or valgus stress, no effusion noted No paraspinal tenderness noted  MRI Pelvis 06/19/22  IMPRESSION: 1. Unchanged postoperative appearance of vulvectomy. No evidence of recurrent or metastatic disease in the pelvis. 2. Multiple uterine fibroids.  3. Mild thickening of the urinary bladder. Correlate with urinalysis.       Assessment & Plan:   Vaginal pain s/p vulvectomy 2018. She has a hx of pagets disease and had extensive ulceration.  -She had partial vulvectomy, lysis adhesions, insertion of axis dermis graft, pudendal nerve block by Dr. Berline Lopes 06/22/22. She reports pain much improved after this surgery.  -Reports benefit with gabapentin- she stopped this as she felt is was causing mild GI discomfort -DC gabapentin- start lyrica 75mg  BID -Prior use of oxycodone, she reports pain currently not sere enough to require this medication -Continue current compounded pain cream- reports this is helping   L hand altered sensation, possibly CTS although tinels and phalens negative  -Night CTS  splint , consider additional intervention if this worsens   R Knee  pain -order voltaren gel, consider xray if worsens

## 2022-07-15 ENCOUNTER — Other Ambulatory Visit: Payer: Self-pay | Admitting: Physician Assistant

## 2022-07-19 ENCOUNTER — Encounter: Payer: Self-pay | Admitting: Obstetrics and Gynecology

## 2022-07-19 ENCOUNTER — Ambulatory Visit (INDEPENDENT_AMBULATORY_CARE_PROVIDER_SITE_OTHER): Payer: Commercial Managed Care - HMO | Admitting: Obstetrics and Gynecology

## 2022-07-19 DIAGNOSIS — Z9889 Other specified postprocedural states: Secondary | ICD-10-CM

## 2022-07-19 NOTE — Progress Notes (Signed)
[facility] Return Visit  SUBJECTIVE  History of Present Illness: Ellen Ryan is a 67 y.o. female seen in follow-up for post op. Plan at last visit was continue utilizing clobetasol nightly and then use vaseline x3 daily.  She has been doing her regimen and reports things are doing well.   She is able to urinate without difficulty.    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   Past Surgical History: She  has a past surgical history that includes Radical vulvectomy (N/A, 09/21/2016); Vulva / perineum biopsy (N/A, 09/13/2017); EUA/ VULVA BIOPSY (09-01-2016  dr dove  [facility]); transthoracic echocardiogram (01/16/2017); Co2 laser application (N/A, 123456); Vulva / perineum biopsy (N/A, 08/31/2021); Co2 laser application (N/A, [id number]); Vulvectomy (N/A, 06/22/2022); Cystoscopy ([id number]); and Graft application ([id number]).   Medications: She has a current medication list which includes the following prescription(s): acetaminophen, amlodipine, atorvastatin, blood glucose meter kit and supplies, true metrix meter, clobetasol prop emollient base, diclofenac sodium, freestyle lite, ibuprofen, freestyle, melatonin, metformin, methocarbamol, multivitamin with minerals, NONFORMULARY OR COMPOUNDED ITEM, oxycodone, polyethylene glycol, pregabalin, and senna.   Allergies: Patient is allergic to gabapentin and hydrocodone.   Social History: Patient  reports that she has never smoked. She has never used smokeless tobacco. She reports that she does not currently use alcohol. She reports that she does not use drugs.      OBJECTIVE     Physical Exam: Gen: No apparent distress, A&O x 3.  Detailed Urogynecologic Evaluation:    External vaginal lips are healing well. Tissues are pale pink and granulation tissue is healing well since last visit. No obvious signs of infection, active bleeding, or cystic areas. Sutures are still dissolving and there is no sign of abscess or other concerning area to note.     ASSESSMENT AND PLAN    Ellen Ryan is a 67 y.o. with:  1. Post-operative state    Patient is healing well. We discussed continuing to do her Vaseline and clobetasol regimen of vaseline 3 times daily and clobetasol nightly. We discussed that the sutures are still healing and will dissolve over time. She reports understanding. She has a f/u with Gyn onc in 2 weeks and will see Dr. [name] in 4 weeks.

## 2022-07-19 NOTE — Patient Instructions (Signed)
Continue doing the Clobetasol cream nightly and do the vaseline 3 times a day.   We will have you follow up once more and then you will follow up PRN.

## 2022-07-28 ENCOUNTER — Inpatient Hospital Stay: Payer: Commercial Managed Care - HMO | Attending: Gynecologic Oncology | Admitting: Gynecologic Oncology

## 2022-07-28 ENCOUNTER — Other Ambulatory Visit: Payer: Self-pay

## 2022-07-28 ENCOUNTER — Encounter: Payer: Self-pay | Admitting: Gynecologic Oncology

## 2022-07-28 VITALS — BP 140/77 | HR 92 | Temp 99.0°F | Wt 166.8 lb

## 2022-07-28 DIAGNOSIS — Z7189 Other specified counseling: Secondary | ICD-10-CM

## 2022-07-28 DIAGNOSIS — R102 Pelvic and perineal pain: Secondary | ICD-10-CM

## 2022-07-28 DIAGNOSIS — Z9079 Acquired absence of other genital organ(s): Secondary | ICD-10-CM

## 2022-07-28 DIAGNOSIS — D071 Carcinoma in situ of vulva: Secondary | ICD-10-CM

## 2022-07-28 NOTE — Patient Instructions (Addendum)
It was good to see you today, you will continue to heal well after surgery.  I will see you for follow-up in 3 months.

## 2022-07-28 NOTE — Progress Notes (Signed)
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  The patient is visiting her daughter now and lives in [place]. She reported a 2 year history of vulvar ulceration. She was seen by Dr Marice Potterove for this problem in April, 2018 and was taken to the OR for an examination under anesthesia on 09/01/16. Extensive ulceration was present. 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     On 09/21/16, she underwent a complete simple vulvectomy.  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 No invasive carcinoma, negative margins. Post-operative course was uneventful and she was discharged with a foley catheter for one week post-op.    She has urinary urgency not resolved by medications. She feels her urine deviates in stream. Since early April, 2019 she developed irritation of the vulva made better with silvadine cream.   Given that she did not tolerate an office examination she was taken to the operating room on Sep 13, 2017 for examination under anesthesia and vulvar biopsies.  Intraoperative findings were significant for subtle acetowhite changes immediately lateral on the right side of the urethral meatus.  There also acetowhite changes to the midline distal vagina.  Vagina was completely agglutinated.  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  The posterior vaginal introitus biopsy revealed benign normal squamous mucosa.   On 10/19/17 she underwent CO2 laser to the urethral meatus.   In May, 2020 she started experiencing a burning sensation at the skin of her introitus/vulva. She had been prescribed clobetasol but had been using silvadine instead of clobetasol. In 10/2018 was recommended to use nightly clobetasol.   Patient represented for care in 07/2021 with sensation of "skin crawling". Had  self treated for possible pinworms. Due to pain limiting exam, EUA and biopsies performed on 08/31/21. Bilateral biopsies were taken showing VIN3 with associated erosion and inflammation. No Paget's disease identified in these biopsies.    Underwent exam under anesthesia on 09/21/2021 as well as vulvar biopsies.  Findings notable for them with complete fusion of the superior labia with only a small, 1-2 cm opening at the posterior fourchette.  Given symptoms suspected to be related to urine being trapped under her fused labia, recommendation was made for suprapubic catheter which was placed in the PACU.  Unfortunately, this got dislodged several days after surgery.  1 vulvar biopsy showed mucosal inflammation and granulation tissue, no dysplasia.  Second vulvar biopsy showed VIN 3, no invasive carcinoma.  MRI pelvis 06/19/22: Unchanged postoperative appearance of the vulva, no evidence of recurrent or metastatic disease in the pelvis.  Multiple uterine fibroids.  Mild thickening of urinary bladder.  06/13/22: Complete simple vulvectomy, lysis of labial adhesions Ellen Holm(Jvion Turgeon); cystoscopy with bladder biopsy, vaginal graft placement, pudendal block Ellen Buff(Schroeder)  Operative findings: Vulva notable for a 7 x 5 area of ulcerated appearing tissue, friable in places, covering agglutinated vagina. No discernable vagina with pinpoint hole towards inferior aspect of ulcerated and beefy red tissue where urine seen intermittently. After simple vulvectomy of the entire erythematous and ulcerated tissue, gentle blunt dissection ultimately led to identification of distal vagina and urethral opening. On cystoscopy, some hypertrophy of the bladder mucosa noted.    Interval History: Doing very well.  Has some pulling sensation as if a pulled muscle and sometimes in her pelvis that radiates down to her  right knee and to her back.  Otherwise, all the symptoms that she was previously having have improved.  She Foley catheter is now out,  she denies any trouble initiating her stream or pain when she urinates.  She has mild, intermittent spotting, denies any heavy bleeding.  Denies any discharge.  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    Past Surgical History:  Procedure Laterality Date   CO2 LASER APPLICATION N/A 10/19/2017   Procedure: CO2 LASER APPLICATION OF THE VULVA;  Surgeon: Adolphus Birchwood, MD;  Location: [facility];  Service: Gynecology;  Laterality: N/A;   CO2 LASER APPLICATION N/A 09/21/2021   Procedure: VAGINOSCOPY, VULVAR BIOPSIES;  Surgeon: Carver Fila, MD;  Location: [facility];  Service: Gynecology;  Laterality: N/A;   CYSTOSCOPY  06/22/2022   Procedure: CYSTOSCOPY with BLADDER BIOPSY;  Surgeon: Marguerita Beards, MD;  Location: [facility];  Service: Gynecology;;   EUA/ VULVA BIOPSY  09-01-2016  dr dove  [facility]   GRAFT APPLICATION  06/22/2022   Procedure: GRAFT APPLICATION;  Surgeon: Marguerita Beards, MD;  Location: [facility];  Service: Gynecology;;   RADICAL VULVECTOMY N/A 09/21/2016   Procedure: RADICAL VULVECTOMY COMPLETE;  Surgeon: Adolphus Birchwood, MD;  Location: [facility];  Service: Gynecology;  Laterality: N/A;   TRANSTHORACIC ECHOCARDIOGRAM  01/16/2017   ef 60-65%,  grade 1 diastolic dysfunction/  very mild AV stenosis with moderately thickened and calcified leaflets (valve area 1.85cm^2)/   moderate LAE   VULVA /PERINEUM BIOPSY N/A 09/13/2017   Procedure: VULVAR BIOPSY;  Surgeon: Adolphus Birchwood, MD;  Location: [facility];  Service: Gynecology;  Laterality: N/A;   VULVA Ples Specter BIOPSY N/A 08/31/2021   Procedure: VULVAR BIOPSY;  Surgeon: Antionette Char, MD;  Location: [facility];  Service: Gynecology;  Laterality: N/A;   VULVECTOMY N/A 06/22/2022   Procedure: WIDE LOCAL EXCISION VULVECTOMY;  Surgeon: Carver Fila, MD;  Location: [facility];  Service: Gynecology;  Laterality: N/A;    Family History  Problem Relation Age of Onset   Stroke Mother    Arthritis Mother    Diabetes Father    Cancer Daughter     Social History   Socioeconomic History   Marital status: Widowed    Spouse name: Not on file   Number of children: Not on file   Years of education: Not on file   Highest education level: Not on file  Occupational History   Not on file  Tobacco Use   Smoking status: Never   Smokeless tobacco: Never  Vaping Use   Vaping Use: Never used  Substance and Sexual Activity   Alcohol use: Not Currently   Drug use: No   Sexual activity: Not Currently    Birth control/protection: Post-menopausal  Other Topics Concern   Not on file  Social History Narrative   Not on file   Social Determinants of Health   Financial Resource Strain: Not on file  Food Insecurity: Not on file  Transportation Needs: Not on file  Physical Activity: Not on file  Stress: Not on file  Social Connections: Not on file    Current Medications:  Current Outpatient Medications:    acetaminophen (TYLENOL) 325 MG tablet, Take 650 mg by mouth every 6 (six) hours as needed., Disp: , Rfl:    amLODipine (NORVASC) 10 MG tablet, Take 1 tablet (10 mg total) by mouth daily. (Patient taking differently: Take 10 mg by mouth daily.), Disp: 90 tablet, Rfl: 3   atorvastatin (LIPITOR) 40 MG tablet, Take 1 tablet by mouth daily., Disp: , Rfl:    blood  glucose meter kit and supplies KIT, Dispense based on patient and insurance preference. Use up to four times daily as directed., Disp: 1 each, Rfl: 0   Blood Glucose Monitoring Suppl (TRUE METRIX METER) w/Device KIT, Use as directed, Disp: 1 kit, Rfl: 0   Clobetasol Prop Emollient Base (CLOBETASOL PROPIONATE E) 0.05 % emollient cream, Place 1 g around incisions inside labia nightly, Disp: 60 g, Rfl: 11   diclofenac Sodium (VOLTAREN ARTHRITIS PAIN) 1 % GEL, Apply 4 g topically 4 (four) times daily., Disp: 150 g, Rfl: 5   glucose blood (FREESTYLE LITE) test strip, USE ONE STRIP TO TEST FOUR TIMES A DAY, Disp: 100 strip, Rfl: 0   ibuprofen (ADVIL) 600 MG tablet, Take 1 tablet (600 mg total) by mouth every 6 (six) hours as needed., Disp: 60 tablet, Rfl: 1   Lancets (FREESTYLE) lancets, USE ONE LANCET TO TEST FOUR TIMES A DAY, Disp: 100 each, Rfl: 0   MELATONIN PO, Take by mouth at bedtime as needed., Disp: , Rfl:    metFORMIN (GLUCOPHAGE) 500 MG tablet, TAKE 1 TABLET BY MOUTH TWICE A DAY WITH A MEAL (Patient taking differently: Take 500 mg by mouth 2 (two) times daily with a meal.), Disp: 360 tablet, Rfl: 0   methocarbamol (ROBAXIN) 500 MG tablet, Take 1 tablet (500 mg total) by mouth every 8 (eight) hours as needed for muscle spasms., Disp: 120 tablet, Rfl: 1   Multiple Vitamin (MULTIVITAMIN WITH MINERALS) TABS tablet, Take 1 tablet by mouth daily. Centrum, Disp: , Rfl:    NONFORMULARY OR COMPOUNDED ITEM, Amitriptyline 2.5%/ gabapentin 2.5%/ baclofen 2.5% in vaginal cream.  Place 1g daily in vaginal/ vulvar area.  Dispense 60g with 5 refills. (Patient taking differently: as directed. Amitriptyline 2.5%/ gabapentin 2.5%/ baclofen 2.5% in vaginal cream.  Place 1g daily in vaginal/ vulvar area.  Dispense 60g with 5 refills.), Disp: 60 each, Rfl: 5   oxyCODONE (OXY IR/ROXICODONE) 5 MG immediate release tablet, Take 1 tablet (5 mg total) by mouth every 4 (four) hours as needed for severe pain. For AFTER surgery,  do not take and drive, Disp: 30 tablet, Rfl: 0   polyethylene glycol (MIRALAX / GLYCOLAX) packet, Take 17 g by mouth daily as needed. As needed, Disp: , Rfl:    pregabalin (LYRICA) 75 MG capsule, Take 1 capsule (75 mg total) by mouth 2 (two) times daily., Disp: 60 capsule, Rfl: 2   senna (SENOKOT) 8.6 MG TABS tablet, Take 1 tablet (8.6 mg total) by mouth at bedtime. (Patient taking differently: Take 1 tablet by mouth at bedtime as needed.), Disp: 30 each, Rfl: 0  Review of Systems: Denies appetite changes, fevers, chills, fatigue,  unexplained weight changes. Denies hearing loss, neck lumps or masses, mouth sores, ringing in ears or voice changes. Denies cough or wheezing.  Denies shortness of breath. Denies chest pain or palpitations. Denies leg swelling. Denies abdominal distention, pain, blood in stools, constipation, diarrhea, nausea, vomiting, or early satiety. Denies pain with intercourse, dysuria, frequency, hematuria or incontinence. Denies hot flashes, pelvic pain, vaginal bleeding or vaginal discharge.   Denies joint pain, back pain or muscle pain/cramps. Denies itching, rash, or wounds. Denies dizziness, headaches, numbness or seizures. Denies swollen lymph nodes or glands, denies easy bruising or bleeding. Denies anxiety, depression, confusion, or decreased concentration.  Physical Exam: BP (!) 140/77 (BP Location: Left Arm, Patient Position: Sitting)   Pulse 92   Temp 99 F (37.2 C) (Oral)   Wt 166 lb 12.8 oz (75.7 kg)   SpO2 100%   BMI 34.86 kg/m  General: Alert, oriented, no acute distress. HEENT: Normocephalic, atraumatic, sclera anicteric. Chest: Unlabored breathing on room air. GU: Small and shallow vaginal opening visualized with urethra noted. Granulation tissue along some aspects of the lateral incisions on the vulva as well as posterior aspect of the vagina that was open where graft was placed.   Laboratory & Radiologic Studies: A. VULVA, WIDE LOCAL EXCISION:   - High-grade squamous intraepithelial lesion (VIN3, carcinoma in situ)  with prominent extension into underlying glands/adnexal structures  - No definitive evidence of invasive carcinoma but focal superficial  invasion cannot be entirely ruled out  - High-grade dysplasia involves the 12-2 o'clock margin; other margins  are negative   B. BLADDER BIOPSY:  - Bladder wall with ulceration  - Negative for dysplasia or malignancy   Assessment & Plan: Ellen Ryan is a 67 y.o. woman with history of vulvar dysplasia, recent biopsy showing high-grade vulvar dysplasia, with almost complete labial fusion and obliteration of her vagina.  Now status post vulvar resection as well as vaginal dilation and identification of urethra.  Patient is doing very well from a postoperative standpoint, incision is healing well.  Some granulation tissue noted.  I will plan to see her back in 3 months for repeat exam.  We discussed 1 positive margin for high-grade dysplasia.  We will follow this clinically.  16 minutes of total time was spent for this patient encounter, including preparation, face-to-face counseling with the patient and coordination of care, and documentation of the encounter.  Eugene Garnet, MD  Division of Gynecologic Oncology  Department of Obstetrics and Gynecology  [facility]

## 2022-08-22 ENCOUNTER — Ambulatory Visit (INDEPENDENT_AMBULATORY_CARE_PROVIDER_SITE_OTHER): Payer: Commercial Managed Care - HMO | Admitting: Obstetrics and Gynecology

## 2022-08-22 ENCOUNTER — Encounter: Payer: Self-pay | Admitting: Obstetrics and Gynecology

## 2022-08-22 VITALS — BP 144/78 | HR 106

## 2022-08-22 DIAGNOSIS — N9089 Other specified noninflammatory disorders of vulva and perineum: Secondary | ICD-10-CM

## 2022-08-22 MED ORDER — ESTRADIOL 0.1 MG/GM VA CREA: TOPICAL_CREAM | VAGINAL | 11 refills | Status: DC

## 2022-08-22 NOTE — Patient Instructions (Signed)
-   Use estrogen cream on the vulva three times per week. Can continue with the vaseline as needed.

## 2022-08-22 NOTE — Progress Notes (Signed)
[facility]  Date of Visit: 08/22/2022  History of Present Illness: Ellen Ryan is a 67 y.o. female scheduled today for a post-operative visit.   Surgery: s/p Partial simple vulvectomy, labial lysis of adhesions, cystoscopy with biopsy, insertion of Axis dermis graft, bilateral pudendal nerve block on 06/22/22 with Ellen Ryan (GYN Onc)  Pain has been ok, not too bad but has some pulling near the top of the labia. She is still using the clobetasol and the vaseline three times per day. Can still urinate but feels it spraying sometimes, not as good of a stream.   Medications: She has a current medication list which includes the following prescription(s): acetaminophen, amlodipine, atorvastatin, blood glucose meter kit and supplies, true metrix meter, clobetasol prop emollient base, diclofenac sodium, [START ON 08/24/2022] estradiol, freestyle lite, ibuprofen, freestyle, melatonin, metformin, methocarbamol, multivitamin with minerals, NONFORMULARY OR COMPOUNDED ITEM, oxycodone, polyethylene glycol, pregabalin, and senna.   Allergies: Patient is allergic to gabapentin and hydrocodone.   Physical Exam: BP (!) 144/78   Pulse (!) 106    Pelvic Examination: fusion of labia over the urethra, small opening noted (see photo). Attempted to pass catheter and unable. External vulvar incisions well healed. No granulation tissue present.    --------------------------------------------------  Assessment and Plan:  1. Vulvar adhesions     - Vulvar adhesions have now again covered the urethra, but still able to void well, although more symptomatic of partial obstruction.  - We discussed this is difficult because another surgery can lead to re-forming of adhesions. However, does not need an extensive resection at this time, so could potentially bring to OR to pull back on adhesions to separate the labia and exposure the urethra.  - Start vaginal estrogen cream on the vulva three times per week to  help estrogenize tissue. This may be helpful in the event we plan to return to OR.  - Will discuss with Ellen Arrie Senate, MD

## 2022-09-14 ENCOUNTER — Encounter
Payer: Commercial Managed Care - HMO | Attending: Physical Medicine & Rehabilitation | Admitting: Physical Medicine & Rehabilitation

## 2022-09-14 DIAGNOSIS — R102 Pelvic and perineal pain: Secondary | ICD-10-CM | POA: Insufficient documentation

## 2022-09-14 DIAGNOSIS — G5602 Carpal tunnel syndrome, left upper limb: Secondary | ICD-10-CM | POA: Insufficient documentation

## 2022-09-14 DIAGNOSIS — G8929 Other chronic pain: Secondary | ICD-10-CM | POA: Insufficient documentation

## 2022-09-14 DIAGNOSIS — M25561 Pain in right knee: Secondary | ICD-10-CM | POA: Insufficient documentation

## 2022-10-19 ENCOUNTER — Other Ambulatory Visit: Payer: Self-pay | Admitting: Physician Assistant

## 2022-10-19 DIAGNOSIS — E119 Type 2 diabetes mellitus without complications: Secondary | ICD-10-CM

## 2022-10-21 ENCOUNTER — Other Ambulatory Visit: Payer: Self-pay | Admitting: Obstetrics and Gynecology

## 2022-10-21 ENCOUNTER — Other Ambulatory Visit: Payer: Self-pay | Admitting: Physician Assistant

## 2022-10-21 DIAGNOSIS — E119 Type 2 diabetes mellitus without complications: Secondary | ICD-10-CM

## 2022-10-21 DIAGNOSIS — G8929 Other chronic pain: Secondary | ICD-10-CM

## 2022-10-23 ENCOUNTER — Other Ambulatory Visit: Payer: Self-pay | Admitting: Physician Assistant

## 2022-10-23 DIAGNOSIS — E119 Type 2 diabetes mellitus without complications: Secondary | ICD-10-CM

## 2022-10-25 ENCOUNTER — Other Ambulatory Visit: Payer: Self-pay | Admitting: Obstetrics and Gynecology

## 2022-10-25 DIAGNOSIS — G8929 Other chronic pain: Secondary | ICD-10-CM

## 2022-10-25 MED ORDER — METHOCARBAMOL 500 MG PO TABS: 500.0000 mg | ORAL_TABLET | Freq: Three times a day (TID) | ORAL | 1 refills | Status: DC | PRN

## 2022-10-27 ENCOUNTER — Inpatient Hospital Stay: Payer: Commercial Managed Care - HMO | Attending: Gynecologic Oncology | Admitting: Gynecologic Oncology

## 2022-10-27 ENCOUNTER — Telehealth: Payer: Self-pay | Admitting: *Deleted

## 2022-10-27 ENCOUNTER — Other Ambulatory Visit: Payer: Self-pay

## 2022-10-27 ENCOUNTER — Encounter: Payer: Self-pay | Admitting: Gynecologic Oncology

## 2022-10-27 VITALS — BP 128/74 | HR 96 | Temp 98.3°F | Wt 168.8 lb

## 2022-10-27 DIAGNOSIS — R102 Pelvic and perineal pain: Secondary | ICD-10-CM | POA: Diagnosis not present

## 2022-10-27 DIAGNOSIS — Q525 Fusion of labia: Secondary | ICD-10-CM | POA: Diagnosis not present

## 2022-10-27 DIAGNOSIS — Z8544 Personal history of malignant neoplasm of other female genital organs: Secondary | ICD-10-CM | POA: Diagnosis not present

## 2022-10-27 DIAGNOSIS — Z9079 Acquired absence of other genital organ(s): Secondary | ICD-10-CM | POA: Diagnosis not present

## 2022-10-27 DIAGNOSIS — D071 Carcinoma in situ of vulva: Secondary | ICD-10-CM | POA: Diagnosis present

## 2022-10-27 DIAGNOSIS — Z7189 Other specified counseling: Secondary | ICD-10-CM | POA: Diagnosis not present

## 2022-10-27 NOTE — Patient Instructions (Addendum)
You have a follow up with your primary care doctor on this Monday. They state they will plan on discussing your medications at this visit.  Preparing for your Surgery  Plan for surgery on November 01, 2022 with Dr. Eugene Garnet at [facility]. You will be scheduled for lysis of adhesions of labial/vulvar agglutination, urethral catheter placement, possible graft placement, possible vulvar biopsies, and any other indicated procedures.   Pre-operative Testing -You will receive a phone call from presurgical testing at [facility] to discuss surgery instructions and arrange for lab work if needed.  -Bring your insurance card, copy of an advanced directive if applicable, medication list.  -You should not be taking blood thinners or aspirin at least ten days prior to surgery unless instructed by your surgeon.  -Do not take supplements such as fish oil (omega 3), red yeast rice, turmeric before your surgery. You want to avoid medications with aspirin in them including headache powders such as BC or Goody's), Excedrin migraine.  Day Before Surgery at Home -You will be advised you can have clear liquids up until 3 hours before your surgery.    Your role in recovery Your role is to become active as soon as directed by your doctor, while still giving yourself time to heal.  Rest when you feel tired. You will be asked to do the following in order to speed your recovery:  - Cough and breathe deeply. This helps to clear and expand your lungs and can prevent pneumonia after surgery.  - STAY ACTIVE WHEN YOU GET HOME. Do mild physical activity. Walking or moving your legs help your circulation and body functions return to normal. Do not try to get up or walk alone the first time after surgery.   -If you develop swelling on one leg or the other, pain in the back of your leg, redness/warmth in one of your legs, please call the office or go to the Emergency Room to have a doppler to rule out a  blood clot. For shortness of breath, chest pain-seek care in the Emergency Room as soon as possible. - Actively manage your pain. Managing your pain lets you move in comfort. We will ask you to rate your pain on a scale of zero to 10. It is your responsibility to tell your doctor or nurse where and how much you hurt so your pain can be treated.  Special Considerations -Your final pathology results from surgery should be available around one week after surgery and the results will be relayed to you when available.  -FMLA forms can be faxed to [phone] and please allow 5-7 business days for completion.  Pain Management After Surgery -You will be prescribed pain medication and bowel regimen medications after surgery.  -Make sure that you have Tylenol and Ibuprofen at home IF YOU ARE ABLE TO TAKE THESE MEDICATIONS to use on a regular basis after surgery for pain control. We recommend alternating the medications every hour to six hours since they work differently and are processed in the body differently for pain relief.  -Review the attached handout on narcotic use and their risks and side effects.   Bowel Regimen -You will be prescribed Sennakot-S to take nightly to prevent constipation especially if you are taking the narcotic pain medication intermittently.  It is important to prevent constipation and drink adequate amounts of liquids. You can stop taking this medication when you are not taking pain medication and you are back on your normal bowel routine.  Risks of Surgery Risks of surgery are low but include bleeding, infection, damage to surrounding structures, re-operation, blood clots, and very rarely death.  AFTER SURGERY INSTRUCTIONS  We recommend purchasing several bags of frozen green peas and dividing them into ziploc bags. You will want to keep these in the freezer and have them ready to use as ice packs to the vulvar incision. Once the ice pack is no longer cold, you can get  another from the freezer. The frozen peas mold to your body better than a regular ice pack.   Use the peri bottle when toileting.  Activity: 1. Be up and out of the bed during the day.  Take a nap if needed.  You may walk up steps but be careful and use the hand rail.  Stair climbing will tire you more than you think, you may need to stop part way and rest.   2. No lifting or straining for 4 weeks over 10 pounds. No pushing, pulling, straining for 4 weeks.  3. No driving for minimum 1 week after surgery.  Do not drive if you are taking narcotic pain medicine and make sure that your reaction time has returned.   4. You can shower as soon as the next day after surgery. Shower daily. No tub baths or submerging your body in water until cleared by your surgeon. If you have the soap that was given to you by pre-surgical testing that was used before surgery, you do not need to use it afterwards because this can irritate your incisions.   5. No sexual activity and nothing in the vagina for 6-8 weeks.  6. You may experience vaginal/vulvar spotting and discharge after surgery.  The spotting is normal but if you experience heavy bleeding, call our office.  7. Take Tylenol or ibuprofen first for pain if you are able to take these medications and only use narcotic pain medication for severe pain not relieved by the Tylenol or Ibuprofen.  Monitor your Tylenol intake to a max of 4,000 mg in a 24 hour period. You can alternate these medications after surgery.  Diet: 1. Low sodium Heart Healthy Diet is recommended but you are cleared to resume your normal (before surgery) diet after your procedure.  2. It is safe to use a laxative, such as Miralax or Colace, if you have difficulty moving your bowels. You will be prescribed Sennakot at bedtime every evening to keep bowel movements regular and to prevent constipation.    Wound Care: 1. Keep clean and dry.  Shower daily.  Reasons to call the Doctor: Fever -  Oral temperature greater than 100.4 degrees Fahrenheit Foul-smelling vaginal discharge Difficulty urinating Nausea and vomiting Increased pain at the site of the incision that is unrelieved with pain medicine. Difficulty breathing with or without chest pain New calf pain especially if only on one side Sudden, continuing increased vaginal bleeding with or without clots.   Contacts: For questions or concerns you should contact:  Dr. Eugene Garnet at [phone]  Warner Mccreedy, NP at [phone]  After Hours: call [phone] and have the GYN Oncologist paged/contacted (after 5 pm or on the weekends).  Messages sent via mychart are for non-urgent matters and are not responded to after hours so for urgent needs, please call the after hours number.

## 2022-10-27 NOTE — Progress Notes (Signed)
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  The patient is visiting her daughter now and lives in [place]. She reported a 2 year history of vulvar ulceration. She was seen by Dr Dove for this problem in April, 2018 and was taken to the OR for an examination under anesthesia on 09/01/16. Extensive ulceration was present. 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     On 09/21/16, she underwent a complete simple vulvectomy.  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 No invasive carcinoma, negative margins. Post-operative course was uneventful and she was discharged with a foley catheter for one week post-op.    She has urinary urgency not resolved by medications. She feels her urine deviates in stream. Since early April, 2019 she developed irritation of the vulva made better with silvadine cream.   Given that she did not tolerate an office examination she was taken to the operating room on Sep 13, 2017 for examination under anesthesia and vulvar biopsies.  Intraoperative findings were significant for subtle acetowhite changes immediately lateral on the right side of the urethral meatus.  There also acetowhite changes to the midline distal vagina.  Vagina was completely agglutinated.  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  The posterior vaginal introitus biopsy revealed benign normal squamous mucosa.   On 10/19/17 she underwent CO2 laser to the urethral meatus.   In May, 2020 she started experiencing a burning sensation at the skin of her introitus/vulva. She had been prescribed clobetasol but had been using silvadine instead of clobetasol. In 10/2018 was recommended to use nightly clobetasol.   Patient represented for care in 07/2021 with sensation of "skin crawling". Had  self treated for possible pinworms. Due to pain limiting exam, EUA and biopsies performed on 08/31/21. Bilateral biopsies were taken showing VIN3 with associated erosion and inflammation. No Paget's disease identified in these biopsies.    Underwent exam under anesthesia on 09/21/2021 as well as vulvar biopsies.  Findings notable for them with complete fusion of the superior labia with only a small, 1-2 cm opening at the posterior fourchette.  Given symptoms suspected to be related to urine being trapped under her fused labia, recommendation was made for suprapubic catheter which was placed in the PACU.  Unfortunately, this got dislodged several days after surgery.  1 vulvar biopsy showed mucosal inflammation and granulation tissue, no dysplasia.  Second vulvar biopsy showed VIN 3, no invasive carcinoma.   MRI pelvis 06/19/22: Unchanged postoperative appearance of the vulva, no evidence of recurrent or metastatic disease in the pelvis.  Multiple uterine fibroids.  Mild thickening of urinary bladder.   06/13/22: Complete simple vulvectomy, lysis of labial adhesions (Maciah Feeback); cystoscopy with bladder biopsy, vaginal graft placement, pudendal block (Schroeder)  Operative findings: Vulva notable for a 7 x 5 area of ulcerated appearing tissue, friable in places, covering agglutinated vagina. No discernable vagina with pinpoint hole towards inferior aspect of ulcerated and beefy red tissue where urine seen intermittently. After simple vulvectomy of the entire erythematous and ulcerated tissue, gentle blunt dissection ultimately led to identification of distal vagina and urethral opening. On cystoscopy, some hypertrophy of the bladder mucosa noted.     Interval History: Seen by Dr. Schroeder in late April, had redeveloped vulvar adhesions but was still voiding (partial obstruction).    She is still able to void but not voiding large amount and having to urinate about once an hour.  Feels things moving around in her  vagina, similar to how she was feeling before last surgery.  Also having deep pelvic pain as well as pain that radiates to her groins and down her right leg, similar to before.  Denies having any trouble sitting as she was previously.  Endorses baseline bowel function.  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    Past Surgical History:  Procedure Laterality Date   CO2 LASER APPLICATION N/A 10/19/2017   Procedure: CO2 LASER APPLICATION OF THE VULVA;  Surgeon: Rossi, Emma, MD;  Location: [facility];  Service: Gynecology;  Laterality: N/A;   CO2 LASER APPLICATION N/A 09/21/2021   Procedure: VAGINOSCOPY, VULVAR BIOPSIES;  Surgeon: Bryer Cozzolino R, MD;  Location: [facility];  Service: Gynecology;  Laterality: N/A;   CYSTOSCOPY  06/22/2022   Procedure: CYSTOSCOPY with BLADDER BIOPSY;  Surgeon: Schroeder, Michelle N, MD;  Location: [facility];  Service: Gynecology;;   EUA/ VULVA BIOPSY  09-01-2016  dr dove  [facility]   GRAFT APPLICATION  06/22/2022   Procedure: GRAFT APPLICATION;  Surgeon: Schroeder, Michelle N, MD;  Location: [facility];  Service: Gynecology;;   RADICAL VULVECTOMY N/A 09/21/2016   Procedure: RADICAL VULVECTOMY COMPLETE;  Surgeon: Rossi, Emma, MD;  Location: [facility];  Service: Gynecology;  Laterality: N/A;   TRANSTHORACIC ECHOCARDIOGRAM  01/16/2017   ef 60-65%,  grade 1 diastolic  dysfunction/  very mild AV stenosis with moderately thickened and calcified leaflets (valve area 1.85cm^2)/  moderate LAE   VULVA /PERINEUM BIOPSY N/A 09/13/2017   Procedure: VULVAR BIOPSY;  Surgeon: Rossi, Emma, MD;  Location: [facility];  Service: Gynecology;  Laterality: N/A;   VULVA /PERINEUM BIOPSY N/A 08/31/2021   Procedure: VULVAR BIOPSY;  Surgeon: Jackson-Moore, Lisa, MD;  Location: [facility];  Service: Gynecology;  Laterality: N/A;   VULVECTOMY N/A 06/22/2022   Procedure: WIDE LOCAL EXCISION VULVECTOMY;  Surgeon: Jamone Garrido R, MD;  Location:  S[facility]ERY [facility];  Service: Gynecology;  Laterality: N/A;    Family History  Problem Relation Age of Onset   Stroke Mother    Arthritis Mother    Diabetes Father    Cancer Daughter     Social History   Socioeconomic History   Marital status: Widowed    Spouse name: Not on file   Number of children: Not on file   Years of education: Not on file   Highest education level: Not on file  Occupational History   Not on file  Tobacco Use   Smoking status: Never   Smokeless tobacco: Never  Vaping Use   Vaping Use: Never used  Substance and Sexual Activity     Alcohol use: Not Currently   Drug use: No   Sexual activity: Not Currently    Birth control/protection: Post-menopausal  Other Topics Concern   Not on file  Social History Narrative   Not on file   Social Determinants of Health   Financial Resource Strain: Not on file  Food Insecurity: Not on file  Transportation Needs: Not on file  Physical Activity: Not on file  Stress: Not on file  Social Connections: Not on file    Current Medications:  Current Outpatient Medications:    acetaminophen (TYLENOL) 325 MG tablet, Take 650 mg by mouth every 6 (six) hours as needed., Disp: , Rfl:    amLODipine (NORVASC) 10 MG tablet, Take 1 tablet (10 mg total) by mouth daily. (Patient taking differently: Take 10 mg by mouth daily.), Disp: 90  tablet, Rfl: 3   atorvastatin (LIPITOR) 40 MG tablet, Take 1 tablet by mouth daily., Disp: , Rfl:    blood glucose meter kit and supplies KIT, Dispense based on patient and insurance preference. Use up to four times daily as directed., Disp: 1 each, Rfl: 0   Blood Glucose Monitoring Suppl (TRUE METRIX METER) w/Device KIT, Use as directed, Disp: 1 kit, Rfl: 0   Clobetasol Prop Emollient Base (CLOBETASOL PROPIONATE E) 0.05 % emollient cream, Place 1 g around incisions inside labia nightly, Disp: 60 g, Rfl: 11   diclofenac Sodium (VOLTAREN ARTHRITIS PAIN) 1 % GEL, Apply 4 g topically 4 (four) times daily., Disp: 150 g, Rfl: 5   estradiol (ESTRACE) 0.1 MG/GM vaginal cream, Place 0.5g three times a week, Disp: 30 g, Rfl: 11   glucose blood (FREESTYLE LITE) test strip, USE ONE STRIP TO TEST FOUR TIMES A DAY, Disp: 100 strip, Rfl: 0   ibuprofen (ADVIL) 600 MG tablet, Take 1 tablet (600 mg total) by mouth every 6 (six) hours as needed., Disp: 60 tablet, Rfl: 1   Lancets (FREESTYLE) lancets, USE ONE LANCET TO TEST FOUR TIMES A DAY, Disp: 100 each, Rfl: 0   MELATONIN PO, Take by mouth at bedtime as needed., Disp: , Rfl:    metFORMIN (GLUCOPHAGE) 500 MG tablet, TAKE 1 TABLET BY MOUTH TWICE A DAY WITH A MEAL (Patient taking differently: Take 500 mg by mouth 2 (two) times daily with a meal.), Disp: 360 tablet, Rfl: 0   methocarbamol (ROBAXIN) 500 MG tablet, Take 1 tablet (500 mg total) by mouth every 8 (eight) hours as needed for muscle spasms., Disp: 120 tablet, Rfl: 1   Multiple Vitamin (MULTIVITAMIN WITH MINERALS) TABS tablet, Take 1 tablet by mouth daily. Centrum, Disp: , Rfl:    NONFORMULARY OR COMPOUNDED ITEM, Amitriptyline 2.5%/ gabapentin 2.5%/ baclofen 2.5% in vaginal cream.  Place 1g daily in vaginal/ vulvar area.  Dispense 60g with 5 refills. (Patient taking differently: as directed. Amitriptyline 2.5%/ gabapentin 2.5%/ baclofen 2.5% in vaginal cream.  Place 1g daily in vaginal/ vulvar area.  Dispense  60g with 5 refills.), Disp: 60 each, Rfl: 5   oxyCODONE (OXY IR/ROXICODONE) 5 MG immediate release tablet, Take 1 tablet (5 mg total) by mouth every 4 (four) hours as needed for severe pain. For AFTER surgery, do not take and drive, Disp: 30 tablet, Rfl: 0   polyethylene glycol (MIRALAX / GLYCOLAX) packet, Take 17 g by mouth daily as needed. As needed, Disp: , Rfl:    pregabalin (LYRICA) 75 MG capsule, Take 1 capsule (75 mg total) by mouth 2 (two) times daily., Disp: 60 capsule, Rfl: 2   senna (SENOKOT)   8.6 MG TABS tablet, Take 1 tablet (8.6 mg total) by mouth at bedtime. (Patient taking differently: Take 1 tablet by mouth at bedtime as needed.), Disp: 30 each, Rfl: 0  Review of Systems: Denies appetite changes, fevers, chills, fatigue, unexplained weight changes. Denies hearing loss, neck lumps or masses, mouth sores, ringing in ears or voice changes. Denies cough or wheezing.  Denies shortness of breath. Denies chest pain or palpitations. Denies leg swelling. Denies abdominal distention, pain, blood in stools, constipation, diarrhea, nausea, vomiting, or early satiety. Denies pain with intercourse, dysuria, frequency, hematuria or incontinence. Denies hot flashes, pelvic pain, vaginal bleeding or vaginal discharge.   Denies joint pain, back pain or muscle pain/cramps. Denies itching, rash, or wounds. Denies dizziness, headaches, numbness or seizures. Denies swollen lymph nodes or glands, denies easy bruising or bleeding. Denies anxiety, depression, confusion, or decreased concentration.  Physical Exam: BP 128/74 (BP Location: Left Arm, Patient Position: Sitting)   Pulse 96   Temp 98.3 F (36.8 C) (Oral)   Wt 168 lb 12.8 oz (76.6 kg)   SpO2 100%   BMI 35.28 kg/m  General: Alert, oriented, no acute distress. HEENT: Normocephalic, atraumatic, sclera anicteric. Chest: Unlabored breathing on room air.  Laboratory & Radiologic Studies: None new  Assessment & Plan: Ellen Ryan is a  67 y.o. woman with history of vulvar dysplasia, recent biopsy showing high-grade vulvar dysplasia, with almost complete labial fusion and obliteration of her vagina.  Now status post vulvar resection as well as vaginal dilation and identification of urethra.  Unfortunately, the patient's daughter passed away from metastatic breast cancer in early June.  Support given.  We discussed in detail that her symptoms are related to reagglutination of her vulva/labia.  Exam not performed to the day given exam findings in April at the time of her appointment with Dr. Schroeder.  We discussed from a surgical standpoint there are 2 options, the first being placement of her suprapubic catheter as we had tried previously and the second being seizure similar to the last 1 we did where we lysed adhesions of her vulva and placed a urinary catheter.  This time, we could try different combination of postoperative creams to help prevent reagglutination and likely leave the urinary catheter longer.  The patient is worried about having the catheter in for services for her daughter which will happen in August and then her plan to travel internationally in either September or October.  I discussed my concern about waiting multiple months to do this that she may develop complete urinary blockage.  Is also worried about traveling with a catheter in place.  Plan to start teaching in terms of catheter care as well as changing bags today.  We will continue this postoperatively.  Patient's son-in-law was available and participated in our discussion today and is in agreement with proceeding with surgery as soon as possible.  Patient asked about refills of multiple medications.  One of these, Robaxin, was just filled by [facility] on 7/3.  The patient states that this medication was allowing her to urinate although not normally.  He also asked about refills for metformin and atorvastatin.  I will have my office reach out to her primary  care provider to request refills of these medications.  We will plan for upcoming lysis of adhesions of labial/vulvar agglutination, urethral catheter placement, possible graft placement, possible vulvar biopsies, and any other indicated procedures.  One of the office nurses spent considerable time today going over preoperative instructions as   well as working with the patient on catheter care, specifically how to alternate between leg bag and larger foley catheter bag.  40 minutes of total time was spent for this patient encounter, including preparation, face-to-face counseling with the patient and coordination of care, and documentation of the encounter.  Nasri Boakye, MD  Division of Gynecologic Oncology  Department of Obstetrics and Gynecology  [facility]   

## 2022-10-27 NOTE — H&P (View-Only) (Signed)
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  The patient is visiting her daughter now and lives in [place]. She reported a 2 year history of vulvar ulceration. She was seen by Dr Marice Potter for this problem in April, 2018 and was taken to the OR for an examination under anesthesia on 09/01/16. Extensive ulceration was present. 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     On 09/21/16, she underwent a complete simple vulvectomy.  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 No invasive carcinoma, negative margins. Post-operative course was uneventful and she was discharged with a foley catheter for one week post-op.    She has urinary urgency not resolved by medications. She feels her urine deviates in stream. Since early April, 2019 she developed irritation of the vulva made better with silvadine cream.   Given that she did not tolerate an office examination she was taken to the operating room on Sep 13, 2017 for examination under anesthesia and vulvar biopsies.  Intraoperative findings were significant for subtle acetowhite changes immediately lateral on the right side of the urethral meatus.  There also acetowhite changes to the midline distal vagina.  Vagina was completely agglutinated.  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  The posterior vaginal introitus biopsy revealed benign normal squamous mucosa.   On 10/19/17 she underwent CO2 laser to the urethral meatus.   In May, 2020 she started experiencing a burning sensation at the skin of her introitus/vulva. She had been prescribed clobetasol but had been using silvadine instead of clobetasol. In 10/2018 was recommended to use nightly clobetasol.   Patient represented for care in 07/2021 with sensation of "skin crawling". Had  self treated for possible pinworms. Due to pain limiting exam, EUA and biopsies performed on 08/31/21. Bilateral biopsies were taken showing VIN3 with associated erosion and inflammation. No Paget's disease identified in these biopsies.    Underwent exam under anesthesia on 09/21/2021 as well as vulvar biopsies.  Findings notable for them with complete fusion of the superior labia with only a small, 1-2 cm opening at the posterior fourchette.  Given symptoms suspected to be related to urine being trapped under her fused labia, recommendation was made for suprapubic catheter which was placed in the PACU.  Unfortunately, this got dislodged several days after surgery.  1 vulvar biopsy showed mucosal inflammation and granulation tissue, no dysplasia.  Second vulvar biopsy showed VIN 3, no invasive carcinoma.   MRI pelvis 06/19/22: Unchanged postoperative appearance of the vulva, no evidence of recurrent or metastatic disease in the pelvis.  Multiple uterine fibroids.  Mild thickening of urinary bladder.   06/13/22: Complete simple vulvectomy, lysis of labial adhesions [name]); cystoscopy with bladder biopsy, vaginal graft placement, pudendal block [name])  Operative findings: Vulva notable for a 7 x 5 area of ulcerated appearing tissue, friable in places, covering agglutinated vagina. No discernable vagina with pinpoint hole towards inferior aspect of ulcerated and beefy red tissue where urine seen intermittently. After simple vulvectomy of the entire erythematous and ulcerated tissue, gentle blunt dissection ultimately led to identification of distal vagina and urethral opening. On cystoscopy, some hypertrophy of the bladder mucosa noted.     Interval History: Seen by Dr. [name] in late April, had redeveloped vulvar adhesions but was still voiding (partial obstruction).  She is still able to void but not voiding large amount and having to urinate about once an hour.  Feels things moving around in her  vagina, similar to how she was feeling before last surgery.  Also having deep pelvic pain as well as pain that radiates to her groins and down her right leg, similar to before.  Denies having any trouble sitting as she was previously.  Endorses baseline bowel function.  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    Past Surgical History:  Procedure Laterality Date   CO2 LASER APPLICATION N/A 10/19/2017   Procedure: CO2 LASER APPLICATION OF THE VULVA;  Surgeon: Adolphus Birchwood, MD;  Location: [facility];  Service: Gynecology;  Laterality: N/A;   CO2 LASER APPLICATION N/A 09/21/2021   Procedure: VAGINOSCOPY, VULVAR BIOPSIES;  Surgeon: Carver Fila, MD;  Location: [facility];  Service: Gynecology;  Laterality: N/A;   CYSTOSCOPY  06/22/2022   Procedure: CYSTOSCOPY with BLADDER BIOPSY;  Surgeon: Marguerita Beards, MD;  Location: [facility];  Service: Gynecology;;   EUA/ VULVA BIOPSY  09-01-2016  dr dove  [facility]   GRAFT APPLICATION  06/22/2022   Procedure: GRAFT APPLICATION;  Surgeon: Marguerita Beards, MD;  Location: [facility];  Service: Gynecology;;   RADICAL VULVECTOMY N/A 09/21/2016   Procedure: RADICAL VULVECTOMY COMPLETE;  Surgeon: Adolphus Birchwood, MD;  Location: [facility];  Service: Gynecology;  Laterality: N/A;   TRANSTHORACIC ECHOCARDIOGRAM  01/16/2017   ef 60-65%,  grade 1 diastolic  dysfunction/  very mild AV stenosis with moderately thickened and calcified leaflets (valve area 1.85cm^2)/  moderate LAE   VULVA /PERINEUM BIOPSY N/A 09/13/2017   Procedure: VULVAR BIOPSY;  Surgeon: Adolphus Birchwood, MD;  Location: [facility];  Service: Gynecology;  Laterality: N/A;   VULVA Ples Specter BIOPSY N/A 08/31/2021   Procedure: VULVAR BIOPSY;  Surgeon: Antionette Char, MD;  Location: [facility];  Service: Gynecology;  Laterality: N/A;   VULVECTOMY N/A 06/22/2022   Procedure: WIDE LOCAL EXCISION VULVECTOMY;  Surgeon: Carver Fila, MD;  Location[facility];  Service: Gynecology;  Laterality: N/A;    Family History  Problem Relation Age of Onset   Stroke Mother    Arthritis Mother    Diabetes Father    Cancer Daughter     Social History   Socioeconomic History   Marital status: Widowed    Spouse name: Not on file   Number of children: Not on file   Years of education: Not on file   Highest education level: Not on file  Occupational History   Not on file  Tobacco Use   Smoking status: Never   Smokeless tobacco: Never  Vaping Use   Vaping Use: Never used  Substance and Sexual Activity  Alcohol use: Not Currently   Drug use: No   Sexual activity: Not Currently    Birth control/protection: Post-menopausal  Other Topics Concern   Not on file  Social History Narrative   Not on file   Social Determinants of Health   Financial Resource Strain: Not on file  Food Insecurity: Not on file  Transportation Needs: Not on file  Physical Activity: Not on file  Stress: Not on file  Social Connections: Not on file    Current Medications:  Current Outpatient Medications:    acetaminophen (TYLENOL) 325 MG tablet, Take 650 mg by mouth every 6 (six) hours as needed., Disp: , Rfl:    amLODipine (NORVASC) 10 MG tablet, Take 1 tablet (10 mg total) by mouth daily. (Patient taking differently: Take 10 mg by mouth daily.), Disp: 90  tablet, Rfl: 3   atorvastatin (LIPITOR) 40 MG tablet, Take 1 tablet by mouth daily., Disp: , Rfl:    blood glucose meter kit and supplies KIT, Dispense based on patient and insurance preference. Use up to four times daily as directed., Disp: 1 each, Rfl: 0   Blood Glucose Monitoring Suppl (TRUE METRIX METER) w/Device KIT, Use as directed, Disp: 1 kit, Rfl: 0   Clobetasol Prop Emollient Base (CLOBETASOL PROPIONATE E) 0.05 % emollient cream, Place 1 g around incisions inside labia nightly, Disp: 60 g, Rfl: 11   diclofenac Sodium (VOLTAREN ARTHRITIS PAIN) 1 % GEL, Apply 4 g topically 4 (four) times daily., Disp: 150 g, Rfl: 5   estradiol (ESTRACE) 0.1 MG/GM vaginal cream, Place 0.5g three times a week, Disp: 30 g, Rfl: 11   glucose blood (FREESTYLE LITE) test strip, USE ONE STRIP TO TEST FOUR TIMES A DAY, Disp: 100 strip, Rfl: 0   ibuprofen (ADVIL) 600 MG tablet, Take 1 tablet (600 mg total) by mouth every 6 (six) hours as needed., Disp: 60 tablet, Rfl: 1   Lancets (FREESTYLE) lancets, USE ONE LANCET TO TEST FOUR TIMES A DAY, Disp: 100 each, Rfl: 0   MELATONIN PO, Take by mouth at bedtime as needed., Disp: , Rfl:    metFORMIN (GLUCOPHAGE) 500 MG tablet, TAKE 1 TABLET BY MOUTH TWICE A DAY WITH A MEAL (Patient taking differently: Take 500 mg by mouth 2 (two) times daily with a meal.), Disp: 360 tablet, Rfl: 0   methocarbamol (ROBAXIN) 500 MG tablet, Take 1 tablet (500 mg total) by mouth every 8 (eight) hours as needed for muscle spasms., Disp: 120 tablet, Rfl: 1   Multiple Vitamin (MULTIVITAMIN WITH MINERALS) TABS tablet, Take 1 tablet by mouth daily. Centrum, Disp: , Rfl:    NONFORMULARY OR COMPOUNDED ITEM, Amitriptyline 2.5%/ gabapentin 2.5%/ baclofen 2.5% in vaginal cream.  Place 1g daily in vaginal/ vulvar area.  Dispense 60g with 5 refills. (Patient taking differently: as directed. Amitriptyline 2.5%/ gabapentin 2.5%/ baclofen 2.5% in vaginal cream.  Place 1g daily in vaginal/ vulvar area.  Dispense  60g with 5 refills.), Disp: 60 each, Rfl: 5   oxyCODONE (OXY IR/ROXICODONE) 5 MG immediate release tablet, Take 1 tablet (5 mg total) by mouth every 4 (four) hours as needed for severe pain. For AFTER surgery, do not take and drive, Disp: 30 tablet, Rfl: 0   polyethylene glycol (MIRALAX / GLYCOLAX) packet, Take 17 g by mouth daily as needed. As needed, Disp: , Rfl:    pregabalin (LYRICA) 75 MG capsule, Take 1 capsule (75 mg total) by mouth 2 (two) times daily., Disp: 60 capsule, Rfl: 2   senna (SENOKOT)  8.6 MG TABS tablet, Take 1 tablet (8.6 mg total) by mouth at bedtime. (Patient taking differently: Take 1 tablet by mouth at bedtime as needed.), Disp: 30 each, Rfl: 0  Review of Systems: Denies appetite changes, fevers, chills, fatigue, unexplained weight changes. Denies hearing loss, neck lumps or masses, mouth sores, ringing in ears or voice changes. Denies cough or wheezing.  Denies shortness of breath. Denies chest pain or palpitations. Denies leg swelling. Denies abdominal distention, pain, blood in stools, constipation, diarrhea, nausea, vomiting, or early satiety. Denies pain with intercourse, dysuria, frequency, hematuria or incontinence. Denies hot flashes, pelvic pain, vaginal bleeding or vaginal discharge.   Denies joint pain, back pain or muscle pain/cramps. Denies itching, rash, or wounds. Denies dizziness, headaches, numbness or seizures. Denies swollen lymph nodes or glands, denies easy bruising or bleeding. Denies anxiety, depression, confusion, or decreased concentration.  Physical Exam: BP 128/74 (BP Location: Left Arm, Patient Position: Sitting)   Pulse 96   Temp 98.3 F (36.8 C) (Oral)   Wt 168 lb 12.8 oz (76.6 kg)   SpO2 100%   BMI 35.28 kg/m  General: Alert, oriented, no acute distress. HEENT: Normocephalic, atraumatic, sclera anicteric. Chest: Unlabored breathing on room air.  Laboratory & Radiologic Studies: None new  Assessment & Plan: Valada Ureta is a  67 y.o. woman with history of vulvar dysplasia, recent biopsy showing high-grade vulvar dysplasia, with almost complete labial fusion and obliteration of her vagina.  Now status post vulvar resection as well as vaginal dilation and identification of urethra.  Unfortunately, the patient's daughter passed away from metastatic breast cancer in early June.  Support given.  We discussed in detail that her symptoms are related to reagglutination of her vulva/labia.  Exam not performed to the day given exam findings in April at the time of her appointment with Dr. [name].  We discussed from a surgical standpoint there are 2 options, the first being placement of her suprapubic catheter as we had tried previously and the second being seizure similar to the last 1 we did where we lysed adhesions of her vulva and placed a urinary catheter.  This time, we could try different combination of postoperative creams to help prevent reagglutination and likely leave the urinary catheter longer.  The patient is worried about having the catheter in for services for her daughter which will happen in August and then her plan to travel internationally in either September or October.  I discussed my concern about waiting multiple months to do this that she may develop complete urinary blockage.  Is also worried about traveling with a catheter in place.  Plan to start teaching in terms of catheter care as well as changing bags today.  We will continue this postoperatively.  Patient's son-in-law was available and participated in our discussion today and is in agreement with proceeding with surgery as soon as possible.  Patient asked about refills of multiple medications.  One of these, Robaxin, was just filled by [facility] on 7/3.  The patient states that this medication was allowing her to urinate although not normally.  He also asked about refills for metformin and atorvastatin.  I will have my office reach out to her primary  care provider to request refills of these medications.  We will plan for upcoming lysis of adhesions of labial/vulvar agglutination, urethral catheter placement, possible graft placement, possible vulvar biopsies, and any other indicated procedures.  One of the office nurses spent considerable time today going over preoperative instructions as  well as working with the patient on catheter care, specifically how to alternate between leg bag and larger foley catheter bag.  40 minutes of total time was spent for this patient encounter, including preparation, face-to-face counseling with the patient and coordination of care, and documentation of the encounter.  Eugene Garnet, MD  Division of Gynecologic Oncology  Department of Obstetrics and Gynecology  [facility]

## 2022-10-27 NOTE — Telephone Encounter (Signed)
PC to patient's PCP office (Dr Lynelle Doctor) - informed staff patient states she needs refills on her metformin & lipitor.  We are calling due to patient's language barrier.  MD office staff states patient has visit with Dr Lynelle Doctor on Monday, 10/30/22 & they will discuss medications at this time.  Patient informed, verbalizes understanding.

## 2022-10-29 ENCOUNTER — Encounter: Payer: Self-pay | Admitting: Family Medicine

## 2022-10-29 NOTE — Progress Notes (Unsigned)
No chief complaint on file.  Her daughter passed away from metastatic breast cancer in June.  Patient has h/o high grade vulvar dysplasia (VIN 3) with almost complete labial fusion and obliteration of her vagina.  She had vulvectomy, LOA, cystoscopy with bladder biopsy and vaginal graft placement 05/2022.  She has since developed more vulvar adhesions, but only has partial obstruction. She has some recurrent symptoms similar to prior to surgery--feeling things moving, pelvic discomfort which radiates down RLE. She is scheduled for repeat surgery 7/10.   Diabetes: She reports compliance with metformin 500mg  BID, and denies side effects. Sugars are running   Lab Results  Component Value Date   HGBA1C 6.2 11/23/2021     She is taking atorvastatin 40mg , denies side effects. Lab Results  Component Value Date   CHOL 118 11/23/2021   HDL 38.60 (L) 11/23/2021   LDLCALC 52 11/23/2021   TRIG 135.0 11/23/2021   CHOLHDL 3 11/23/2021    Elevated LFT's were noted 11/2021.  RUQ [place] was recommended. This was never done (has only had pelvic imaging). She had negative HepC testing at that time. Follow-up LFT's in January were normal. Lab Results  Component Value Date   ALT 29 04/27/2022   AST 30 04/27/2022   ALKPHOS 88 04/27/2022   BILITOT 0.4 04/27/2022    HTN:  She is on amlodipine. Was never on ACEI or ARB. She denies headaches, dizziness, chest pain, palpitations, shortness of breath, edema.  BP Readings from Last 3 Encounters:  10/27/22 128/74  08/22/22 (!) 144/78  07/28/22 (!) 140/77      PMH, PSH, SH and FH reviewed/updated    ROS: no fever, chills, URI symptoms, chest pain, shortness of breath. Denies bleeding, rash. No nausea, vomiting, bowel changes.   Moods?  No hearing or vision concerns No dysuria.  Urinary issues and pelvic discomfort per HPI.   PHYSICAL EXAM:  There were no vitals taken for this visit.  Wt Readings from Last 3 Encounters:  10/27/22 168 lb  12.8 oz (76.6 kg)  07/28/22 166 lb 12.8 oz (75.7 kg)  07/14/22 169 lb 9.6 oz (76.9 kg)   ***GENERAL HEENT: conjunctiva and sclera are clear, EOMI. Neck: no lymphadenopathy, thyromegaly or mass, no bruit Heart: regular rate and rhythm Lungs: clear bilaterally Back: no spinal or CVA tenderness Abdomen: protuberant, but soft (pt states unchanged, known fibroid uterus). Nontender in suprapubic area. Extremities: no edema, normal pulses. Normal monofilament sensation. No lesions. Psych:  Normal eye contact, speech, hygiene and grooming Neuro: alert and oriented, cranial nerves grossly intact. Gait? Normal now? ***  DIABETIC FOOT EXAM  ASSESSMENT/PLAN:  A1c Prevnar-20 recommended (got pneumovax a year ago)   How long is she staying in this country?? I think Sept/October, per GYN note ?need to schedule DM eye exam vs will she get at home? Should have enough amlodipine through 12/2022 (from [place] at [place], seen prior to coming here)

## 2022-10-30 ENCOUNTER — Encounter: Payer: Self-pay | Admitting: Family Medicine

## 2022-10-30 ENCOUNTER — Ambulatory Visit (INDEPENDENT_AMBULATORY_CARE_PROVIDER_SITE_OTHER): Payer: Commercial Managed Care - HMO | Admitting: Family Medicine

## 2022-10-30 VITALS — BP 130/70 | HR 84 | Ht <= 58 in | Wt 168.8 lb

## 2022-10-30 DIAGNOSIS — E785 Hyperlipidemia, unspecified: Secondary | ICD-10-CM

## 2022-10-30 DIAGNOSIS — I10 Essential (primary) hypertension: Secondary | ICD-10-CM | POA: Diagnosis not present

## 2022-10-30 DIAGNOSIS — Z5181 Encounter for therapeutic drug level monitoring: Secondary | ICD-10-CM

## 2022-10-30 DIAGNOSIS — I152 Hypertension secondary to endocrine disorders: Secondary | ICD-10-CM

## 2022-10-30 DIAGNOSIS — E1159 Type 2 diabetes mellitus with other circulatory complications: Secondary | ICD-10-CM

## 2022-10-30 DIAGNOSIS — E1169 Type 2 diabetes mellitus with other specified complication: Secondary | ICD-10-CM | POA: Diagnosis not present

## 2022-10-30 DIAGNOSIS — E119 Type 2 diabetes mellitus without complications: Secondary | ICD-10-CM

## 2022-10-30 DIAGNOSIS — F4321 Adjustment disorder with depressed mood: Secondary | ICD-10-CM

## 2022-10-30 DIAGNOSIS — R059 Cough, unspecified: Secondary | ICD-10-CM

## 2022-10-30 DIAGNOSIS — E1165 Type 2 diabetes mellitus with hyperglycemia: Secondary | ICD-10-CM

## 2022-10-30 LAB — POCT GLYCOSYLATED HEMOGLOBIN (HGB A1C): Hemoglobin A1C: 6.9 % — AB (ref 4.0–5.6)

## 2022-10-30 MED ORDER — METFORMIN HCL 500 MG PO TABS
ORAL_TABLET | ORAL | 1 refills | Status: DC
Start: 2022-10-30 — End: 2023-02-13

## 2022-10-30 MED ORDER — AMLODIPINE BESYLATE 10 MG PO TABS
10.0000 mg | ORAL_TABLET | Freq: Every day | ORAL | 3 refills | Status: DC
Start: 2022-10-30 — End: 2023-02-20

## 2022-10-30 MED ORDER — ATORVASTATIN CALCIUM 40 MG PO TABS: 40.0000 mg | ORAL_TABLET | Freq: Every day | ORAL | 1 refills | Status: DC

## 2022-10-30 NOTE — Patient Instructions (Signed)
DUE TO COVID-19 ONLY TWO VISITORS  (aged 69 and older)  ARE ALLOWED TO COME WITH YOU AND STAY IN THE WAITING ROOM ONLY DURING PRE OP AND PROCEDURE.   **NO VISITORS ARE ALLOWED IN THE SHORT STAY AREA OR RECOVERY ROOM!!**  IF YOU WILL BE ADMITTED INTO THE HOSPITAL YOU ARE ALLOWED ONLY FOUR SUPPORT PEOPLE DURING VISITATION HOURS ONLY (7 AM -8PM)   The support person(s) must pass our screening, gel in and out, and wear a mask at all times, including in the patient's room. Patients must also wear a mask when staff or their support person are in the room. Visitors GUEST BADGE MUST BE WORN VISIBLY  One adult visitor may remain with you overnight and MUST be in the room by 8 P.M.     Your procedure is scheduled on: 11/01/22   Report to [facility] Main Entrance    Report to admitting at : 2:00 PM   Call this number if you have problems the morning of surgery [phone]   Do not eat food :After Midnight.   After Midnight you may have the following liquids until : 1:00 PM DAY OF SURGERY  Water Black Coffee (sugar ok, NO MILK/CREAM OR CREAMERS)  Tea (sugar ok, NO MILK/CREAM OR CREAMERS) regular and decaf                             Plain Jell-O (NO RED)                                           Fruit ices (not with fruit pulp, NO RED)                                     Popsicles (NO RED)                                                                  Juice: apple, WHITE grape, WHITE cranberry Sports drinks like Gatorade (NO RED)              Oral Hygiene is also important to reduce your risk of infection.                                    Remember - BRUSH YOUR TEETH THE MORNING OF SURGERY WITH YOUR REGULAR TOOTHPASTE  DENTURES WILL BE REMOVED PRIOR TO SURGERY PLEASE DO NOT APPLY "Poly grip" OR ADHESIVES!!!   Do NOT smoke after Midnight   Take these medicines the morning of surgery with A SIP OF WATER: pregabalin,amlodipine.Tylenol as needed.  DO NOT TAKE ANY ORAL DIABETIC  MEDICATIONS DAY OF YOUR SURGERY                              You may not have any metal on your body including hair pins, jewelry, and body piercing  Do not wear make-up, lotions, powders, perfumes/cologne, or deodorant  Do not wear nail polish including gel and S&S, artificial/acrylic nails, or any other type of covering on natural nails including finger and toenails. If you have artificial nails, gel coating, etc. that needs to be removed by a nail salon please have this removed prior to surgery or surgery may need to be canceled/ delayed if the surgeon/ anesthesia feels like they are unable to be safely monitored.   Do not shave  48 hours prior to surgery.    Do not bring valuables to the hospital. [facility] IS NOT             RESPONSIBLE   FOR VALUABLES.   Contacts, glasses, or bridgework may not be worn into surgery.   Bring small overnight bag day of surgery.   DO NOT BRING YOUR HOME MEDICATIONS TO THE HOSPITAL. PHARMACY WILL DISPENSE MEDICATIONS LISTED ON YOUR MEDICATION LIST TO YOU DURING YOUR ADMISSION IN THE HOSPITAL!    Patients discharged on the day of surgery will not be allowed to drive home.  Someone NEEDS to stay with you for the first 24 hours after anesthesia.   Special Instructions: Bring a copy of your healthcare power of attorney and living will documents         the day of surgery if you haven't scanned them before.              Please read over the following fact sheets you were given: IF YOU HAVE QUESTIONS ABOUT YOUR PRE-OP INSTRUCTIONS PLEASE CALL [phone]    [facility] - Preparing for Surgery Before surgery, you can play an important role.  Because skin is not sterile, your skin needs to be as free of germs as possible.  You can reduce the number of germs on your skin by washing with CHG (chlorahexidine gluconate) soap before surgery.  CHG is an antiseptic cleaner which kills germs and bonds with the skin to continue killing germs even after  washing. Please DO NOT use if you have an allergy to CHG or antibacterial soaps.  If your skin becomes reddened/irritated stop using the CHG and inform your nurse when you arrive at Short Stay. Do not shave (including legs and underarms) for at least 48 hours prior to the first CHG shower.  You may shave your face/neck. Please follow these instructions carefully:  1.  Shower with CHG Soap the night before surgery and the  morning of Surgery.  2.  If you choose to wash your hair, wash your hair first as usual with your  normal  shampoo.  3.  After you shampoo, rinse your hair and body thoroughly to remove the  shampoo.                           4.  Use CHG as you would any other liquid soap.  You can apply chg directly  to the skin and wash                       Gently with a scrungie or clean washcloth.  5.  Apply the CHG Soap to your body ONLY FROM THE NECK DOWN.   Do not use on face/ open                           Wound or open sores. Avoid contact with eyes,  ears mouth and genitals (private parts).                       Wash face,  Genitals (private parts) with your normal soap.             6.  Wash thoroughly, paying special attention to the area where your surgery  will be performed.  7.  Thoroughly rinse your body with warm water from the neck down.  8.  DO NOT shower/wash with your normal soap after using and rinsing off  the CHG Soap.                9.  Pat yourself dry with a clean towel.            10.  Wear clean pajamas.            11.  Place clean sheets on your bed the night of your first shower and do not  sleep with pets. Day of Surgery : Do not apply any lotions/deodorants the morning of surgery.  Please wear clean clothes to the hospital/surgery center.  FAILURE TO FOLLOW THESE INSTRUCTIONS MAY RESULT IN THE CANCELLATION OF YOUR SURGERY PATIENT SIGNATURE_________________________________  NURSE  SIGNATURE__________________________________  ________________________________________________________________________ WHAT IS A BLOOD TRANSFUSION? Blood Transfusion Information  A transfusion is the replacement of blood or some of its parts. Blood is made up of multiple cells which provide different functions. Red blood cells carry oxygen and are used for blood loss replacement. White blood cells fight against infection. Platelets control bleeding. Plasma helps clot blood. Other blood products are available for specialized needs, such as hemophilia or other clotting disorders. BEFORE THE TRANSFUSION  Who gives blood for transfusions?  Healthy volunteers who are fully evaluated to make sure their blood is safe. This is blood bank blood. Transfusion therapy is the safest it has ever been in the practice of medicine. Before blood is taken from a donor, a complete history is taken to make sure that person has no history of diseases nor engages in risky social behavior (examples are intravenous drug use or sexual activity with multiple partners). The donor's travel history is screened to minimize risk of transmitting infections, such as malaria. The donated blood is tested for signs of infectious diseases, such as HIV and hepatitis. The blood is then tested to be sure it is compatible with you in order to minimize the chance of a transfusion reaction. If you or a relative donates blood, this is often done in anticipation of surgery and is not appropriate for emergency situations. It takes many days to process the donated blood. RISKS AND COMPLICATIONS Although transfusion therapy is very safe and saves many lives, the main dangers of transfusion include:  Getting an infectious disease. Developing a transfusion reaction. This is an allergic reaction to something in the blood you were given. Every precaution is taken to prevent this. The decision to have a blood transfusion has been considered carefully  by your caregiver before blood is given. Blood is not given unless the benefits outweigh the risks. AFTER THE TRANSFUSION Right after receiving a blood transfusion, you will usually feel much better and more energetic. This is especially true if your red blood cells have gotten low (anemic). The transfusion raises the level of the red blood cells which carry oxygen, and this usually causes an energy increase. The nurse administering the transfusion will monitor you carefully for complications. HOME CARE INSTRUCTIONS  No special instructions are  needed after a transfusion. You may find your energy is better. Speak with your caregiver about any limitations on activity for underlying diseases you may have. SEEK MEDICAL CARE IF:  Your condition is not improving after your transfusion. You develop redness or irritation at the intravenous (IV) site. SEEK IMMEDIATE MEDICAL CARE IF:  Any of the following symptoms occur over the next 12 hours: Shaking chills. You have a temperature by mouth above 102 F (38.9 C), not controlled by medicine. Chest, back, or muscle pain. People around you feel you are not acting correctly or are confused. Shortness of breath or difficulty breathing. Dizziness and fainting. You get a rash or develop hives. You have a decrease in urine output. Your urine turns a dark color or changes to pink, red, or brown. Any of the following symptoms occur over the next 10 days: You have a temperature by mouth above 102 F (38.9 C), not controlled by medicine. Shortness of breath. Weakness after normal activity. The white part of the eye turns yellow (jaundice). You have a decrease in the amount of urine or are urinating less often. Your urine turns a dark color or changes to pink, red, or brown. Document Released: 04/07/2000 Document Revised: 07/03/2011 Document Reviewed: 11/25/2007 [facility] Patient Information 2014 [place],  [place].  _______________________________________________________________________

## 2022-10-30 NOTE — Patient Instructions (Addendum)
I recommend getting the Prevnar-20 (newest pneumonia shot), prior to leaving the country.  This should be the last pneumonia vaccine you will ever need.  Please try and schedule a diabetic eye exam (ideally before leaving, if you are will be gone for an entire year).  Please contact [facility] (hospice) for grief counseling.  I think this will help the whole family.  If you are back in your country in 6 months, you should see your doctor over there. If you are back here, you will need to be seen here in 6 months.  Your cough may be related to postnasal drainage (there was mucus noted in the nose). You can take a medication such as claritin, allegra or zyrtec to dry up the mucus (and therefore prevent the cough), and/or use Mucinex DM as needed for coughing.

## 2022-10-31 ENCOUNTER — Other Ambulatory Visit: Payer: Commercial Managed Care - HMO

## 2022-10-31 ENCOUNTER — Encounter (HOSPITAL_COMMUNITY)
Admission: RE | Admit: 2022-10-31 | Discharge: 2022-10-31 | Disposition: A | Discharge status: Home / Self Care | Payer: Commercial Managed Care - HMO | Source: Ambulatory Visit | Attending: Gynecologic Oncology | Admitting: Gynecologic Oncology

## 2022-10-31 ENCOUNTER — Encounter: Payer: Self-pay | Admitting: Family Medicine

## 2022-10-31 ENCOUNTER — Other Ambulatory Visit: Payer: Self-pay

## 2022-10-31 ENCOUNTER — Telehealth: Payer: Self-pay | Admitting: *Deleted

## 2022-10-31 ENCOUNTER — Encounter (HOSPITAL_COMMUNITY): Payer: Self-pay

## 2022-10-31 VITALS — BP 142/78 | HR 81 | Temp 98.3°F | Ht <= 58 in | Wt 166.0 lb

## 2022-10-31 DIAGNOSIS — I10 Essential (primary) hypertension: Secondary | ICD-10-CM

## 2022-10-31 DIAGNOSIS — Q525 Fusion of labia: Secondary | ICD-10-CM

## 2022-10-31 DIAGNOSIS — Z131 Encounter for screening for diabetes mellitus: Secondary | ICD-10-CM | POA: Diagnosis not present

## 2022-10-31 DIAGNOSIS — E119 Type 2 diabetes mellitus without complications: Secondary | ICD-10-CM

## 2022-10-31 DIAGNOSIS — Z01818 Encounter for other preprocedural examination: Secondary | ICD-10-CM | POA: Diagnosis present

## 2022-10-31 DIAGNOSIS — R9431 Abnormal electrocardiogram [ECG] [EKG]: Secondary | ICD-10-CM | POA: Diagnosis not present

## 2022-10-31 DIAGNOSIS — R809 Proteinuria, unspecified: Secondary | ICD-10-CM | POA: Insufficient documentation

## 2022-10-31 DIAGNOSIS — Z5181 Encounter for therapeutic drug level monitoring: Secondary | ICD-10-CM

## 2022-10-31 DIAGNOSIS — E1169 Type 2 diabetes mellitus with other specified complication: Secondary | ICD-10-CM

## 2022-10-31 LAB — HEPATIC FUNCTION PANEL
Albumin: 4.3 g/dL (ref 3.9–4.9)
Alkaline Phosphatase: 110 IU/L (ref 44–121)
Bilirubin Total: <0.2 mg/dL (ref 0.0–1.2)
Total Protein: 7.7 g/dL (ref 6.0–8.5)

## 2022-10-31 LAB — BASIC METABOLIC PANEL
Anion gap: 10 (ref 5–15)
BUN: 13 mg/dL (ref 8–23)
CO2: 25 mmol/L (ref 22–32)
Calcium: 9.2 mg/dL (ref 8.9–10.3)
Chloride: 102 mmol/L (ref 98–111)
Creatinine, Ser: 0.66 mg/dL (ref 0.44–1.00)
GFR, Estimated: >60 mL/min (ref >60)
Glucose, Bld: 267 mg/dL — ABNORMAL HIGH (ref 70–99)
Potassium: 4 mmol/L (ref 3.5–5.1)
Sodium: 137 mmol/L (ref 135–145)

## 2022-10-31 LAB — MICROALBUMIN / CREATININE URINE RATIO
Creatinine, Urine: 34.3 mg/dL
Microalb/Creat Ratio: 223 mg/g creat — ABNORMAL HIGH (ref 0–29)
Microalbumin, Urine: 76.4 ug/mL

## 2022-10-31 LAB — CBC
HCT: 37.8 % (ref 36.0–46.0)
Hemoglobin: 11.1 g/dL — ABNORMAL LOW (ref 12.0–15.0)
MCH: 21.9 pg — ABNORMAL LOW (ref 26.0–34.0)
MCHC: 29.4 g/dL — ABNORMAL LOW (ref 30.0–36.0)
MCV: 74.4 fL — ABNORMAL LOW (ref 80.0–100.0)
Platelets: 416 10*3/uL — ABNORMAL HIGH (ref 150–400)
RBC: 5.08 MIL/uL (ref 3.87–5.11)
RDW: 17.1 % — ABNORMAL HIGH (ref 11.5–15.5)
WBC: 9.2 10*3/uL (ref 4.0–10.5)
nRBC: 0 % (ref 0.0–0.2)

## 2022-10-31 LAB — GLUCOSE, CAPILLARY: Glucose-Capillary: 272 mg/dL — ABNORMAL HIGH (ref 70–99)

## 2022-10-31 LAB — LIPID PANEL
Cholesterol, Total: 170 mg/dL (ref 100–199)
HDL: 47 mg/dL (ref >39)
Triglycerides: 193 mg/dL — ABNORMAL HIGH (ref 0–149)

## 2022-10-31 NOTE — Progress Notes (Signed)
For Short Stay: COVID SWAB appointment date:  Bowel Prep reminder:   For Anesthesia: PCP - Dr. Joselyn Arrow Cardiologist - N/A  Chest x-ray -  EKG - 10/31/22 Stress Test -  ECHO -  Cardiac Cath -  Pacemaker/ICD device last checked: Pacemaker orders received: Device Rep notified:  Spinal Cord Stimulator:  Sleep Study - N/A CPAP -   Fasting Blood Sugar - 140's - 150's Checks Blood Sugar ___1__ times a week Date and result of last Hgb A1c-6.9: 10/30/22  Last dose of GLP1 agonist- N/A GLP1 instructions:   Last dose of SGLT-2 inhibitors- N/A SGLT-2 instructions:   Blood Thinner Instructions: N/A Aspirin Instructions: Last Dose:  Activity level: Can go up a flight of stairs and activities of daily living without stopping and without chest pain and/or shortness of breath   Able to exercise without chest pain and/or shortness of breath  Anesthesia review: Hx: HTN,DIA  Patient denies shortness of breath, fever, cough and chest pain at PAT appointment   Patient verbalized understanding of instructions that were given to them at the PAT appointment. Patient was also instructed that they will need to review over the PAT instructions again at home before surgery.

## 2022-10-31 NOTE — Telephone Encounter (Addendum)
Telephone call to check on pre-operative status. Spoke with patient's son in law Ellen Ryan, Patient compliant with pre-operative instructions.  Reinforced nothing to eat after midnight. Clear liquids until 1200. Patient to arrive at 1 pm.   Also relayed message from Dr. Pricilla Ryan that patient's blood glucose from her pre-op labs this morning is elevated. Ellen Ryan stated patient did not have breakfast prior to this lab appointment. Pt also has not started on her Metformin as of yet,  they just picked it up from pharmacy. Counseled patient and her son-in-law that she needs to take her metformin as prescribed by her PCP Dr. Lynelle Ryan, twice daily with meals. Also educated on keeping track of her intake of carbohydrates  especially sweets. Verbalized understanding. No further questions or concerns voiced.  Instructed to call for any needs.

## 2022-10-31 NOTE — Telephone Encounter (Signed)
Attempted to reach Ms.Boyd in regards to her recent lab results with increased Glucose and pre-op call. Voicemail box was full, unable to leave message. Will try and call alternate number listed on chart for her son-in-law.

## 2022-11-01 ENCOUNTER — Ambulatory Visit (HOSPITAL_COMMUNITY)
Admission: RE | Admit: 2022-11-01 | Discharge: 2022-11-01 | Disposition: A | Discharge status: Home / Self Care | Payer: Commercial Managed Care - HMO | Attending: Gynecologic Oncology | Admitting: Gynecologic Oncology

## 2022-11-01 ENCOUNTER — Ambulatory Visit (HOSPITAL_COMMUNITY): Payer: Commercial Managed Care - HMO | Admitting: Anesthesiology

## 2022-11-01 ENCOUNTER — Ambulatory Visit (HOSPITAL_BASED_OUTPATIENT_CLINIC_OR_DEPARTMENT_OTHER): Payer: Commercial Managed Care - HMO | Admitting: Anesthesiology

## 2022-11-01 ENCOUNTER — Encounter (HOSPITAL_COMMUNITY)
Admission: RE | Disposition: A | Discharge status: Home / Self Care | Payer: Self-pay | Source: Home / Self Care | Attending: Gynecologic Oncology

## 2022-11-01 ENCOUNTER — Other Ambulatory Visit: Payer: Self-pay

## 2022-11-01 ENCOUNTER — Encounter (HOSPITAL_COMMUNITY): Payer: Self-pay | Admitting: Gynecologic Oncology

## 2022-11-01 DIAGNOSIS — E785 Hyperlipidemia, unspecified: Secondary | ICD-10-CM | POA: Insufficient documentation

## 2022-11-01 DIAGNOSIS — E119 Type 2 diabetes mellitus without complications: Secondary | ICD-10-CM | POA: Insufficient documentation

## 2022-11-01 DIAGNOSIS — Z09 Encounter for follow-up examination after completed treatment for conditions other than malignant neoplasm: Secondary | ICD-10-CM | POA: Diagnosis present

## 2022-11-01 DIAGNOSIS — N992 Postprocedural adhesions of vagina: Secondary | ICD-10-CM | POA: Insufficient documentation

## 2022-11-01 DIAGNOSIS — R102 Pelvic and perineal pain: Secondary | ICD-10-CM | POA: Insufficient documentation

## 2022-11-01 DIAGNOSIS — Z7984 Long term (current) use of oral hypoglycemic drugs: Secondary | ICD-10-CM | POA: Diagnosis not present

## 2022-11-01 DIAGNOSIS — M1990 Unspecified osteoarthritis, unspecified site: Secondary | ICD-10-CM | POA: Insufficient documentation

## 2022-11-01 DIAGNOSIS — I10 Essential (primary) hypertension: Secondary | ICD-10-CM | POA: Insufficient documentation

## 2022-11-01 DIAGNOSIS — Q525 Fusion of labia: Secondary | ICD-10-CM | POA: Diagnosis not present

## 2022-11-01 DIAGNOSIS — N895 Stricture and atresia of vagina: Secondary | ICD-10-CM

## 2022-11-01 HISTORY — PX: LABIOPLASTY: SHX1900

## 2022-11-01 LAB — GLUCOSE, CAPILLARY
Glucose-Capillary: 164 mg/dL — ABNORMAL HIGH (ref 70–99)
Glucose-Capillary: 109 mg/dL — ABNORMAL HIGH (ref 70–99)

## 2022-11-01 LAB — LIPID PANEL
Chol/HDL Ratio: 3.6 ratio (ref 0.0–4.4)
LDL Chol Calc (NIH): 90 mg/dL (ref 0–99)
VLDL Cholesterol Cal: 33 mg/dL (ref 5–40)

## 2022-11-01 LAB — HEPATIC FUNCTION PANEL
ALT: 24 IU/L (ref 0–32)
AST: 22 IU/L (ref 0–40)

## 2022-11-01 SURGERY — EXAM UNDER ANESTHESIA
Anesthesia: General

## 2022-11-01 MED ORDER — ONDANSETRON HCL 4 MG/2ML IJ SOLN
INTRAMUSCULAR | Status: DC | PRN
  Administered 2022-11-01: 4 mg via INTRAVENOUS

## 2022-11-01 MED ORDER — MIDAZOLAM HCL 2 MG/2ML IJ SOLN
INTRAMUSCULAR | Status: AC
  Filled 2022-11-01: qty 2

## 2022-11-01 MED ORDER — MIDAZOLAM HCL 2 MG/2ML IJ SOLN
INTRAMUSCULAR | Status: DC | PRN
  Administered 2022-11-01: 2 mg via INTRAVENOUS

## 2022-11-01 MED ORDER — ACETAMINOPHEN 500 MG PO TABS
1000.0000 mg | ORAL_TABLET | ORAL | Status: DC
  Filled 2022-11-01: qty 2

## 2022-11-01 MED ORDER — PROPOFOL 10 MG/ML IV BOLUS
INTRAVENOUS | Status: DC | PRN
  Administered 2022-11-01: 150 mg via INTRAVENOUS
  Administered 2022-11-01: 50 mg via INTRAVENOUS

## 2022-11-01 MED ORDER — LIDOCAINE 2% (20 MG/ML) 5 ML SYRINGE
INTRAMUSCULAR | Status: DC | PRN
  Administered 2022-11-01: 50 mg via INTRAVENOUS

## 2022-11-01 MED ORDER — BUPIVACAINE LIPOSOME 1.3 % IJ SUSP
INTRAMUSCULAR | Status: DC | PRN
  Administered 2022-11-01: 20 mL

## 2022-11-01 MED ORDER — ORAL CARE MOUTH RINSE: 15.0000 mL | Freq: Once | OROMUCOSAL | Status: AC

## 2022-11-01 MED ORDER — BUPIVACAINE LIPOSOME 1.3 % IJ SUSP
INTRAMUSCULAR | Status: AC
  Filled 2022-11-01: qty 20

## 2022-11-01 MED ORDER — STERILE WATER FOR IRRIGATION IR SOLN
Status: DC | PRN
  Administered 2022-11-01: 1000 mL

## 2022-11-01 MED ORDER — BUPIVACAINE HCL 0.25 % IJ SOLN
INTRAMUSCULAR | Status: AC
  Filled 2022-11-01: qty 1

## 2022-11-01 MED ORDER — ACETIC ACID 5 % SOLN
Status: AC
  Filled 2022-11-01: qty 50

## 2022-11-01 MED ORDER — LIDOCAINE HCL (PF) 1 % IJ SOLN
INTRAMUSCULAR | Status: AC
  Filled 2022-11-01: qty 30

## 2022-11-01 MED ORDER — LACTATED RINGERS IV SOLN: INTRAVENOUS | Status: DC

## 2022-11-01 MED ORDER — FENTANYL CITRATE (PF) 100 MCG/2ML IJ SOLN
INTRAMUSCULAR | Status: AC
  Filled 2022-11-01: qty 2

## 2022-11-01 MED ORDER — CHLORHEXIDINE GLUCONATE 0.12 % MT SOLN
15.0000 mL | Freq: Once | OROMUCOSAL | Status: AC
  Administered 2022-11-01: 15 mL via OROMUCOSAL

## 2022-11-01 MED ORDER — ONDANSETRON HCL 4 MG/2ML IJ SOLN
INTRAMUSCULAR | Status: AC
  Filled 2022-11-01: qty 2

## 2022-11-01 MED ORDER — DEXAMETHASONE SODIUM PHOSPHATE 10 MG/ML IJ SOLN
INTRAMUSCULAR | Status: AC
  Filled 2022-11-01: qty 1

## 2022-11-01 MED ORDER — DEXAMETHASONE SODIUM PHOSPHATE 4 MG/ML IJ SOLN: 4.0000 mg | INTRAMUSCULAR | Status: DC

## 2022-11-01 MED ORDER — DEXAMETHASONE SODIUM PHOSPHATE 10 MG/ML IJ SOLN
INTRAMUSCULAR | Status: DC | PRN
  Administered 2022-11-01: 4 mg via INTRAVENOUS

## 2022-11-01 MED ORDER — FENTANYL CITRATE (PF) 100 MCG/2ML IJ SOLN
INTRAMUSCULAR | Status: DC | PRN
  Administered 2022-11-01 (×2): 25 ug via INTRAVENOUS

## 2022-11-01 MED ORDER — INSULIN ASPART 100 UNIT/ML IJ SOLN
0.0000 [IU] | INTRAMUSCULAR | Status: DC | PRN
  Administered 2022-11-01: 2 [IU] via SUBCUTANEOUS
  Filled 2022-11-01: qty 1

## 2022-11-01 MED ORDER — PROPOFOL 10 MG/ML IV BOLUS
INTRAVENOUS | Status: AC
  Filled 2022-11-01: qty 20

## 2022-11-01 MED ORDER — LIDOCAINE HCL (PF) 2 % IJ SOLN
INTRAMUSCULAR | Status: AC
  Filled 2022-11-01: qty 5

## 2022-11-01 MED ORDER — TRAMADOL HCL 50 MG PO TABS: 50.0000 mg | ORAL_TABLET | Freq: Four times a day (QID) | ORAL | 0 refills | Status: DC | PRN

## 2022-11-01 SURGICAL SUPPLY — 41 items
BAG COUNTER SPONGE SURGICOUNT (BAG) IMPLANT
BAG SPNG CNTER NS LX DISP (BAG)
BLADE SURG 15 STRL LF DISP TIS (BLADE) IMPLANT
BLADE SURG 15 STRL SS (BLADE)
BLADE SURG SZ11 CARB STEEL (BLADE) IMPLANT
CATH ROBINSON RED A/P 16FR (CATHETERS) IMPLANT
DRAPE SHEET LG 3/4 BI-LAMINATE (DRAPES) ×1 IMPLANT
DRAPE SURG IRRIG POUCH 19X23 (DRAPES) ×1 IMPLANT
DRSG TELFA 3X8 NADH STRL (GAUZE/BANDAGES/DRESSINGS) ×1 IMPLANT
GAUZE 4X4 16PLY ~~LOC~~+RFID DBL (SPONGE) ×1 IMPLANT
GAUZE SPONGE 4X4 12PLY STRL (GAUZE/BANDAGES/DRESSINGS) ×1 IMPLANT
GLOVE BIO SURGEON STRL SZ 6 (GLOVE) ×2 IMPLANT
GOWN STRL REUS W/ TWL LRG LVL3 (GOWN DISPOSABLE) ×1 IMPLANT
GOWN STRL REUS W/TWL LRG LVL3 (GOWN DISPOSABLE) ×1
KIT TURNOVER KIT A (KITS) IMPLANT
NDL HYPO 21X1.5 SAFETY (NEEDLE) ×1 IMPLANT
NDL HYPO 25X1 1.5 SAFETY (NEEDLE) IMPLANT
NDL SPNL 22GX7 QUINCKE BK (NEEDLE) IMPLANT
NEEDLE HYPO 21X1.5 SAFETY (NEEDLE) ×1 IMPLANT
NEEDLE HYPO 25X1 1.5 SAFETY (NEEDLE) IMPLANT
NEEDLE SPNL 22GX7 QUINCKE BK (NEEDLE) IMPLANT
NS IRRIG 1000ML POUR BTL (IV SOLUTION) ×1 IMPLANT
PACK PERINEAL COLD (PAD) ×1 IMPLANT
PACK VAGINAL WOMENS (CUSTOM PROCEDURE TRAY) ×1 IMPLANT
PAD PREP 24X48 CUFFED NSTRL (MISCELLANEOUS) ×1 IMPLANT
SCOPETTES 8 STERILE (MISCELLANEOUS) IMPLANT
SOL PREP POV-IOD 4OZ 10% (MISCELLANEOUS) ×1 IMPLANT
SUT VIC AB 0 CT1 27 (SUTURE) ×1
SUT VIC AB 0 CT1 27XBRD ANTBC (SUTURE) ×1 IMPLANT
SUT VIC AB 2-0 SH 27 (SUTURE) ×1
SUT VIC AB 2-0 SH 27X BRD (SUTURE) ×1 IMPLANT
SUT VIC AB 3-0 SH 27 (SUTURE) ×1
SUT VIC AB 3-0 SH 27XBRD (SUTURE) ×1 IMPLANT
SUT VIC AB 4-0 PS2 27 (SUTURE) ×1 IMPLANT
SYR BULB IRRIG 60ML STRL (SYRINGE) ×1 IMPLANT
SYR CONTROL 10ML LL (SYRINGE) IMPLANT
TOWEL OR 17X26 10 PK STRL BLUE (TOWEL DISPOSABLE) ×2 IMPLANT
TRAY FOLEY MTR SLVR 16FR STAT (SET/KITS/TRAYS/PACK) IMPLANT
UNDERPAD 30X36 HEAVY ABSORB (UNDERPADS AND DIAPERS) ×1 IMPLANT
WATER STERILE IRR 500ML POUR (IV SOLUTION) ×2 IMPLANT
YANKAUER SUCT BULB TIP NO VENT (SUCTIONS) ×1 IMPLANT

## 2022-11-01 NOTE — Anesthesia Procedure Notes (Signed)
Procedure Name: LMA Insertion Date/Time: 11/01/2022 4:23 PM  Performed by: Nelle Don, CRNAPre-anesthesia Checklist: Patient identified, Emergency Drugs available, Suction available and Patient being monitored Patient Re-evaluated:Patient Re-evaluated prior to induction Oxygen Delivery Method: Circle system utilized Preoxygenation: Pre-oxygenation with 100% oxygen Induction Type: IV induction LMA: LMA inserted LMA Size: 4.0 Number of attempts: 1 Dental Injury: Teeth and Oropharynx as per pre-operative assessment

## 2022-11-01 NOTE — Anesthesia Preprocedure Evaluation (Signed)
Anesthesia Evaluation  Patient identified by MRN, date of birth, ID band Patient awake    Reviewed: Allergy & Precautions, H&P , NPO status , Patient's Chart, lab work & pertinent test results  Airway Mallampati: II   Neck ROM: full    Dental   Pulmonary neg pulmonary ROS   breath sounds clear to auscultation       Cardiovascular hypertension, + Valvular Problems/Murmurs  Rhythm:regular Rate:Normal  TTE (2018): mild AS mean PG 11 mmHg. Normal EF.   Neuro/Psych    GI/Hepatic   Endo/Other  diabetes, Type 2    Renal/GU      Musculoskeletal  (+) Arthritis ,    Abdominal   Peds  Hematology   Anesthesia Other Findings   Reproductive/Obstetrics                             Anesthesia Physical Anesthesia Plan  ASA: 3  Anesthesia Plan: General   Post-op Pain Management:    Induction: Intravenous  PONV Risk Score and Plan: 3 and Ondansetron, Propofol infusion and Treatment may vary due to age or medical condition  Airway Management Planned: LMA  Additional Equipment:   Intra-op Plan:   Post-operative Plan: Extubation in OR  Informed Consent: I have reviewed the patients History and Physical, chart, labs and discussed the procedure including the risks, benefits and alternatives for the proposed anesthesia with the patient or authorized representative who has indicated his/her understanding and acceptance.     Dental advisory given  Plan Discussed with: CRNA, Anesthesiologist and Surgeon  Anesthesia Plan Comments:        Anesthesia Quick Evaluation

## 2022-11-01 NOTE — Progress Notes (Signed)
There is an elevated amount of protein in the urine, which indicates some damage to the kidney.  This is often due to diabetes, and if blood pressure isn't controlled.  When people with diabetes have this finding, they should be on certain blood pressure medications that help to protect the kidney.  She is not on this type of blood pressure medication.  Her blood pressure was well controlled on the amlodipine.  I suggest adding Valsartan 80mg  and have her CUT the dose of her amlodipine to 1/2 tablet (5mg ), and schedule follow-up in 2-3 weeks.  If she is hesitant to make that change, we can add 40 mg of valsartan, continue the full 10 mg of amlodipine, and monitor her blood pressure closely (lowering the amlodipine only if BP is low or if she feels dizzy). Either way she will need a f/u appt in 2-3 weeks. If she has concerns/questions about this, happy to discuss at a visit before making the change. FYI--she is having SURGERY today, so best to hold off on calling her for a couple of days.

## 2022-11-01 NOTE — Transfer of Care (Signed)
Immediate Anesthesia Transfer of Care Note  Patient: Ellen Ryan  Procedure(s) Performed: EXAM UNDER ANESTHESIA, urethral catheter placement lysis of adhesions of labia / vulva  Patient Location: PACU  Anesthesia Type:General  Level of Consciousness: drowsy  Airway & Oxygen Therapy: Patient Spontanous Breathing and Patient connected to face mask oxygen  Post-op Assessment: Report given to RN, Post -op Vital signs reviewed and stable, and Patient moving all extremities X 4  Post vital signs: Reviewed and stable  Last Vitals:  Vitals Value Taken Time  BP 115/63   Temp    Pulse 60   Resp 12   SpO2 97     Last Pain:  Vitals:   11/01/22 1408  TempSrc:   PainSc: 0-No pain         Complications: No notable events documented.

## 2022-11-01 NOTE — Anesthesia Postprocedure Evaluation (Signed)
Anesthesia Post Note  Patient: Ellen Ryan  Procedure(s) Performed: EXAM UNDER ANESTHESIA, urethral catheter placement lysis of adhesions of labia / vulva     Patient location during evaluation: PACU Anesthesia Type: General Level of consciousness: awake Pain management: pain level controlled Vital Signs Assessment: post-procedure vital signs reviewed and stable Respiratory status: spontaneous breathing, nonlabored ventilation and respiratory function stable Cardiovascular status: blood pressure returned to baseline and stable Postop Assessment: no apparent nausea or vomiting Anesthetic complications: no   No notable events documented.  Last Vitals:  Vitals:   11/01/22 1745 11/01/22 1756  BP: (!) 140/81 137/69  Pulse: 72 (!) 59  Resp: (!) 22   Temp:    SpO2: 100% 94%    Last Pain:  Vitals:   11/01/22 1745  TempSrc:   PainSc: 0-No pain                 Nekia Maxham P Tinita Brooker

## 2022-11-01 NOTE — Interval H&P Note (Signed)
History and Physical Interval Note:  11/01/2022 3:40 PM  Ellen Ryan  has presented today for surgery, with the diagnosis of Agglutination of the vulva.  The various methods of treatment have been discussed with the patient and family. After consideration of risks, benefits and other options for treatment, the patient has consented to  Procedure(s): EXAM UNDER ANESTHESIA, urethral catheter placement (N/A) possible,VULVAR BIOPSY with possible graft placement (N/A) lysis of adhesions of labia / vulva (N/A) as a surgical intervention.  The patient's history has been reviewed, patient examined, no change in status, stable for surgery.  I have reviewed the patient's chart and labs.  Questions were answered to the patient's satisfaction.     Carver Fila

## 2022-11-01 NOTE — Op Note (Signed)
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 Bilateral vulva almost completely agglutinated along the midline with two small glandular appearing openings. Dilation of both and separation of the epithelium dividing the two openings from each other allowed for visualization of urethral mucosa. Clear urine noted after foley catheter placement.     Procedure: The patient was identified in the preoperative holding area. Informed consent was signed on the chart. Patient was seen history was reviewed and exam was performed.    The patient was then taken to the operating room and placed in the supine position with SCD hose on. General anesthesia was then induced without difficulty. She was then placed in the dorsolithotomy position. The vulva was prepped with Betadine. The patient was then draped after the prep was dried.    Timeout was performed the patient, procedure, antibiotic, allergy, and length of procedure. The vulvar tissues were inspected findings noted above.   A combination of blunt dissection and Hegar dilators were used to slowly dilated two openings noted along the midline of the vulva. The epithelium separating these two openings was cauterized. Once urethral mucosa was noted, the urethra was carefully dilated and an 82F catheter was placed with return of clean urine. Foley catheter was sequentially upsized to a 53F, 69F and ultimately 61F foley.    20 cc of Exparel was  injected for anesthesia at the end of surgery.   All instrument, suture, laparotomy, Ray-Tec, and needle counts were correct x2. The patient tolerated the procedure well and was taken recovery room in stable condition.    Eugene Garnet MD Gynecologic Oncology

## 2022-11-01 NOTE — Discharge Instructions (Addendum)
AFTER SURGERY INSTRUCTIONS   We recommend purchasing several bags of frozen green peas and dividing them into ziploc bags. You can use this for swelling or any pain on your vulva. You will want to keep these in the freezer and have them ready to use as ice packs to the vulvar incision. Once the ice pack is no longer cold, you can get another from the freezer. The frozen peas mold to your body better than a regular ice pack.   Today, Dr. [name] opened the urethra where the urine comes through and placed a catheter to keep this open and keep urine flowing.   Activity: 1. Be up and out of the bed during the day.  Take a nap if needed.  You may walk up steps but be careful and use the hand rail.  Stair climbing will tire you more than you think, you may need to stop part way and rest.    2. No lifting or straining for 1 week over 10 pounds. No pushing, pulling, straining for 1 week.   3. No driving for minimum 24 hours after surgery.  Do not drive if you are taking narcotic pain medicine and make sure that your reaction time has returned.    4. You can shower as soon as the next day after surgery. Shower daily. No tub baths or submerging your body in water until cleared by your surgeon. If you have the soap that was given to you by pre-surgical testing that was used before surgery, you do not need to use it afterwards because this can irritate your incisions.    5. You may experience vulvar spotting and discharge after surgery.  The spotting is normal but if you experience heavy bleeding, call our office.   6. Take Tylenol or ibuprofen first for pain if you are able to take these medications and only use narcotic pain medication for severe pain not relieved by the Tylenol or Ibuprofen.  Monitor your Tylenol intake to a max of 4,000 mg in a 24 hour period. You can alternate these medications after surgery.   Diet: 1. Low sodium Heart Healthy Diet is recommended but you are cleared to resume your  normal (before surgery) diet after your procedure.   2. It is safe to use a laxative, such as Miralax or Colace, if you have difficulty moving your bowels. You can continue Sennakot at bedtime every evening to keep bowel movements regular and to prevent constipation.     Wound Care: 1. Keep clean and dry.  Shower daily.   Reasons to call the Doctor: Fever - Oral temperature greater than 100.4 degrees Fahrenheit Foul-smelling vaginal discharge Nausea and vomiting Increased pain at the site of the incision that is unrelieved with pain medicine. Difficulty breathing with or without chest pain New calf pain especially if only on one side  Contacts: For questions or concerns you should contact:   Dr. Eugene Garnet at [phone]   Warner Mccreedy, NP at [phone]   After Hours: call [phone] and have the GYN Oncologist paged/contacted (after 5 pm or on the weekends).   Messages sent via mychart are for non-urgent matters and are not responded to after hours so for urgent needs, please call the after hours number.

## 2022-11-02 ENCOUNTER — Telehealth: Payer: Self-pay | Admitting: Surgery

## 2022-11-02 ENCOUNTER — Encounter (HOSPITAL_COMMUNITY): Payer: Self-pay | Admitting: Gynecologic Oncology

## 2022-11-02 ENCOUNTER — Encounter: Payer: Self-pay | Admitting: Internal Medicine

## 2022-11-02 NOTE — Telephone Encounter (Signed)
Spoke with Ms. Eagon this morning. She states she is eating, drinking and urinating well. She has not had a BM yet but is passing gas. She is taking miralax as prescribed and encouraged her to drink plenty of water. She denies fever or chills. Incisions are dry and intact. She rates her pain 4/10. Her pain is controlled with Tylenol and Ibuprofen.     Instructed to call office with any fever, chills, purulent drainage, uncontrolled pain or any other questions or concerns. Patient verbalizes understanding.   Pt aware of post op appointments as well as the office number [phone] and after hours number [phone] to call if she has any questions or concerns

## 2022-11-02 NOTE — Telephone Encounter (Signed)
Post op call attempt. LVM.

## 2022-11-07 ENCOUNTER — Other Ambulatory Visit: Payer: Self-pay | Admitting: Internal Medicine

## 2022-11-07 MED ORDER — VALSARTAN 80 MG PO TABS: 80.0000 mg | ORAL_TABLET | Freq: Every day | ORAL | 0 refills | Status: DC

## 2022-11-08 ENCOUNTER — Inpatient Hospital Stay (HOSPITAL_BASED_OUTPATIENT_CLINIC_OR_DEPARTMENT_OTHER): Payer: Commercial Managed Care - HMO

## 2022-11-08 VITALS — BP 135/77 | HR 88 | Temp 98.6°F | Resp 18 | Wt 167.2 lb

## 2022-11-08 DIAGNOSIS — Q525 Fusion of labia: Secondary | ICD-10-CM

## 2022-11-08 NOTE — Progress Notes (Signed)
Ellen Ryan here for a nurse visit. Patient is doing well since her surgery on July 10th. Patient is only using leg bag that is draining well to gravity. Pt states she is emptying it several times a day and also at night so it doesn't fill up. Urine is clear, yellow and patient denies all urinary symptoms.  When asked the patient why she is not using the foley bag at night, she states, "I don't feel comfortable converting it and the leg bag is working out well." Offered to assist patient with teaching and she denied and wants to continue using the leg bag. Advised and instructed on keeping the leg bag below the bladder and pt verbalized understanding.   Patient states she is having a pulling sensation inside from pelvis area only when she stands to walk and also on occasion when she is having a bowel movement. Patient is moving her bowel regularly and denies constipation. Patient states this sensation comes and goes and nothing she does makes it worse or makes it go away. Pt states, "when this pulling is happening sometimes it feels higher inside to my stomach" and then sometimes the pulling feels lower to my stomach and radiates to the outside of my right knee."  Pt states the pulling releases and then goes away.   Patient instructed this could be scar tissue and continue to monitor and call the office with any worsening symptoms, concerns or questions. Patient reminded of her nurse visit on Wednesday, July 17 th at 1pm.

## 2022-11-15 ENCOUNTER — Inpatient Hospital Stay (HOSPITAL_BASED_OUTPATIENT_CLINIC_OR_DEPARTMENT_OTHER): Payer: Commercial Managed Care - HMO

## 2022-11-15 ENCOUNTER — Telehealth: Payer: Self-pay | Admitting: Family Medicine

## 2022-11-15 VITALS — BP 140/78 | HR 92 | Temp 98.4°F | Resp 18 | Ht <= 58 in | Wt 166.0 lb

## 2022-11-15 DIAGNOSIS — Q525 Fusion of labia: Secondary | ICD-10-CM

## 2022-11-15 NOTE — Progress Notes (Signed)
Patient here for a nurse evaluation in regards to her indwelling foley catheter. Pt states catheter is draining well and she denies all urinary symptoms.  Pt presents today with leg bag to gravity that is draining clear yellow urine.   Advised patient again on using the foley bag at night while she sleeps because it will hang lower to gravity to prevent urine back flowing into bladder and the foley bag will hold more than a liter of urine. Pt refuses to disconnect the leg bag to foley and again offered to assist patient with disconnecting and  teaching. Pt insist that her leg bag is working well for her. Pt reiterated to keep the leg bag as long as she can at night while she sleeps. Pt verbalized understanding.   Pt also concerned about her blood sugar checks at home have been high. Pt states, "yesterday morning by blood sugar was 242 prior to breakfast". Pt currently has her Free Style Lite with her and didn't check her BS this am. At this visit Pt's blood sugar via finger stick with her Free Style Lite was  352.  Pt's blood sugar via finger stick with Nova Machine at clinic was 305. Pt states she is taking her Metformin 500 mg twice a day.   Advised patient to notify her PCP Dr. Lynelle Doctor for evaluation and appt. As soon as possible. Reminded pt of her follow up with Dr. [name] Friday, August 2nd. At 1245.  Pt verbalized understanding.   Dr. Delford Field office was called at [phone] and relayed message that patient will need an appointment for blood sugar management. Spoke with Marchelle Folks who states she will call the patient today.

## 2022-11-15 NOTE — Telephone Encounter (Signed)
Nurse from GYN/Oncology at [facility] called, pt was in there today again for follow up after Labia fusion on 7/10. Blood sugars had been running high so nurse had her bring her machine today so they could compare readings on patient's Free style is was 352 on machine at hospital it was 305 Pt did not check this morning but yesterday morning it was 250+ Nurse advised her to make appointment with Dr. Lynelle Doctor  Patient asked her to call us and have [place] call her. I did call patient, left message for her to call for an appointment

## 2022-11-15 NOTE — Telephone Encounter (Signed)
Yes, needs visit scheduled. (Also was to schedule a visit to f/u on BP, since we adjusted her BP meds due to microalbuminuria (as per result notes).

## 2022-11-16 ENCOUNTER — Encounter: Payer: Self-pay | Admitting: Family Medicine

## 2022-11-16 ENCOUNTER — Ambulatory Visit (INDEPENDENT_AMBULATORY_CARE_PROVIDER_SITE_OTHER): Payer: Commercial Managed Care - HMO | Admitting: Family Medicine

## 2022-11-16 VITALS — BP 130/80 | HR 72 | Ht <= 58 in | Wt 168.0 lb

## 2022-11-16 DIAGNOSIS — E1165 Type 2 diabetes mellitus with hyperglycemia: Secondary | ICD-10-CM

## 2022-11-16 DIAGNOSIS — Z960 Presence of urogenital implants: Secondary | ICD-10-CM

## 2022-11-16 DIAGNOSIS — R809 Proteinuria, unspecified: Secondary | ICD-10-CM

## 2022-11-16 DIAGNOSIS — R8290 Unspecified abnormal findings in urine: Secondary | ICD-10-CM

## 2022-11-16 DIAGNOSIS — N3000 Acute cystitis without hematuria: Secondary | ICD-10-CM

## 2022-11-16 LAB — POCT URINALYSIS DIP (PROADVANTAGE DEVICE)
Bilirubin, UA: NEGATIVE
Glucose, UA: NEGATIVE mg/dL
Ketones, POC UA: NEGATIVE mg/dL
Nitrite, UA: POSITIVE — AB
Protein Ur, POC: 30 mg/dL — AB
Specific Gravity, Urine: 1.01
Urobilinogen, Ur: 0.2
pH, UA: 8 (ref 5.0–8.0)

## 2022-11-16 LAB — POCT CBG (FASTING - GLUCOSE)-MANUAL ENTRY: Glucose Fasting, POC: 119 mg/dL — AB (ref 70–99)

## 2022-11-16 MED ORDER — BLOOD GLUCOSE MONITORING SUPPL DEVI
1.0000 | Freq: Two times a day (BID) | 0 refills | Status: AC
Start: 2022-11-16 — End: ?

## 2022-11-16 MED ORDER — LANCET DEVICE MISC
1.0000 | Freq: Three times a day (TID) | 0 refills | Status: AC
Start: 2022-11-16 — End: 2022-12-16

## 2022-11-16 MED ORDER — LANCETS MISC. MISC
1.0000 | Freq: Two times a day (BID) | 0 refills | Status: AC
Start: 2022-11-16 — End: 2022-12-16

## 2022-11-16 MED ORDER — BLOOD GLUCOSE TEST VI STRP
1.0000 | ORAL_STRIP | Freq: Two times a day (BID) | 0 refills | Status: AC
Start: 2022-11-16 — End: 2023-02-24

## 2022-11-16 MED ORDER — CIPROFLOXACIN HCL 250 MG PO TABS
250.0000 mg | ORAL_TABLET | Freq: Two times a day (BID) | ORAL | 0 refills | Status: AC
Start: 2022-11-16 — End: 2022-11-23

## 2022-11-16 NOTE — Progress Notes (Signed)
Chief Complaint  Patient presents with   Consult    Elevated blood sugar readings. Last meal was at 10am (breakfast).    Patient presents to discuss her high blood sugars. She was seen by nurses at Gyn-Onc office yesterday for catheter check (she has indwelling catheter with leg bag).  Her sugar on her monitor was 352, checked by office glucometer glucose was 305.  She had told them sugar was 242 before breakfast the day prior.  She reports that her monitor is 69 years old (and the lancets and test strips), requesting new supplies.  She denies polydipsia. She reports feeling well. No hypoglycemia. She states that yesterday her urine was very clear and pale. Today it seems cloudy.  Sometimes sees a speck of blood, sometimes some white (?pus). She has some intermittent discomfort in suprapubic area.   Diabetes--at her visit earlier this month she reported that her sugars had been running 117-125 in the morning.  Noted it increased up to 150's after her daughter passed.  Review of available recors shows glu 267 on 7/9. She had it checked twice on the day of her surgery, 7/10--164 at 2pm, 109 at 5:12 pm.  She had been taking metformin 500 mg BID.  She states she took 2 metformin this morning (recalls taking 2 BID about 5 years ago, cut back on the dose when sugars improved). She never had diarrhea or side effects to the higher dose of metformin.  Lab Results  Component Value Date   HGBA1C 6.9 (A) 10/30/2022    Microalbuminuria:  She was started on valsartan 80mg  for microalbuminuria.  Her amlodipine dose was cut to 1/2 tablet daily. She denies side effects. No headaches, dizziness. No edema.   PMH, PSH, SH reviewed  Outpatient Encounter Medications as of 11/16/2022  Medication Sig Note   acetaminophen (TYLENOL) 500 MG tablet Take 500 mg by mouth every 6 (six) hours as needed. 11/16/2022: Took after meal at 10:00 w/tylenol   amLODipine (NORVASC) 10 MG tablet Take 1 tablet (10 mg total) by mouth  daily. 11/16/2022: Taking 1/2 tablet (since valsartan was added)   atorvastatin (LIPITOR) 40 MG tablet Take 1 tablet (40 mg total) by mouth daily.    ibuprofen (ADVIL) 600 MG tablet Take 600 mg by mouth every 6 (six) hours as needed. 11/16/2022: Took after meal at 10:00am w/ibuprofen   metFORMIN (GLUCOPHAGE) 500 MG tablet TAKE 1 TABLET BY MOUTH TWICE A DAY WITH A MEAL 11/16/2022: Took 1000mg  after breakfast   Multiple Vitamin (MULTIVITAMIN WITH MINERALS) TABS tablet Take 1 tablet by mouth daily. Centrum    Omega-3 Fatty Acids (FISH OIL) 1000 MG CPDR Take 1 capsule by mouth daily.    polyethylene glycol (MIRALAX / GLYCOLAX) packet Take 17 g by mouth daily as needed for mild constipation. 11/16/2022: Last dose yesterday   valsartan (DIOVAN) 80 MG tablet Take 1 tablet (80 mg total) by mouth daily.    [DISCONTINUED] acetaminophen (TYLENOL) 325 MG tablet Take 650 mg by mouth as needed for moderate pain.    Melatonin 10 MG CAPS Take 10 mg by mouth at bedtime as needed (sleep). (Patient not taking: Reported on 11/16/2022) 11/16/2022: As needed   senna (SENOKOT) 8.6 MG TABS tablet Take 1 tablet (8.6 mg total) by mouth at bedtime. (Patient not taking: Reported on 11/16/2022) 11/16/2022: As needed   [DISCONTINUED] methocarbamol (ROBAXIN) 500 MG tablet TAKE ONE TABLET BY MOUTH EVERY 8 HOURS AS NEEDED FOR MUSCLE SPASMS 10/31/2022: No fill hx found for this medication.  Pt is adamant she is still taking this medication. Dispense report does not support this claim.    No facility-administered encounter medications on file as of 11/16/2022.   Allergies  Allergen Reactions   Gabapentin Other (See Comments)    GI discomfort   Hydrocodone Itching   ROS: no fever, chills, URI symptoms. No flank pain.  Some intermittent lower abdominal pain.  No n/v/d.  Overall feels good. No headaches, dizziness, chest pain, edema.   PHYSICAL EXAM:  BP 130/80   Pulse 72   Ht 4\' 10"  (1.473 m)   Wt 168 lb (76.2 kg)   BMI 35.11 kg/m    Pleasant, well-appearing female, in good spirits. HEENT: conjunctiva and sclera are clear, EOMI. Abdomen: soft, nontender Back: no CVA tenderness Leg bag--has cloudy urine.   Urine specimen was collected from bag--mod blood, +protein, +nirite, 3+ leuks. We unclamped the tube from the bag, got 1 drop of urine, which appeared very cloudy also. Extremities: no edema  Glucose 119 (at 3:50p, last ate at 10 am).   ASSESSMENT/PLAN:  Type 2 diabetes mellitus with hyperglycemia, without long-term current use of insulin (HCC) - suspect related to UTI, d/t indwelling catheter. Rx'd new supplies/monitor. Increase metformin to 1000mg  BID, take prior to meals. f/u 2 weeks with list of glu - Plan: Glucose (CBG), Fasting, Blood Glucose Monitoring Suppl DEVI, Glucose Blood (BLOOD GLUCOSE TEST STRIPS) STRP, Lancet Device MISC, Lancets Misc. MISC  Cloudy urine - Plan: POCT Urinalysis DIP (Proadvantage Device)  Indwelling urethral catheter present - Plan: POCT Urinalysis DIP (Proadvantage Device)  Acute cystitis without hematuria - Plan: ciprofloxacin (CIPRO) 250 MG tablet, Urine Culture  Microalbuminuria - tolerating ARB, and BP today on regimen of 1/2 tab amlodipine and valsartan ok. Monitor BP, bring list to f/u in 2 wks   We are treating you for a possible bladder infection with cipro.  Take this antibiotic twice daily for 7 days. Be sure to drink plenty of water. Let us know if your urine stays cloudy, if there is any blood, if you have fever, or more pain. Hopefully the urine will lighten, and your sugars will come down quickly.   I agree that we should continue the higher dose of metformin for now. Since you just got #180 pill of the 500 mg dose, you can use those up by taking two pills before breakfast, and 2 pills before dinner. Monitor your sugars regularly (with the new monitor--a prescription was sent to your pharmacy). Let us know if you have side effects.  Return in 2-3 weeks with  your list of blood sugars and your list of blood pressures.

## 2022-11-16 NOTE — Patient Instructions (Signed)
  We are treating you for a possible bladder infection with cipro.  Take this antibiotic twice daily for 7 days. Be sure to drink plenty of water. Let us know if your urine stays cloudy, if there is any blood, if you have fever, or more pain. Hopefully the urine will lighten, and your sugars will come down quickly.   I agree that we should continue the higher dose of metformin for now. Since you just got #180 pill of the 500 mg dose, you can use those up by taking two pills before breakfast, and 2 pills before dinner. Monitor your sugars regularly (with the new monitor--a prescription was sent to your pharmacy). Let us know if you have side effects.  Return in 2-3 weeks with your list of blood sugars and your list of blood pressures.

## 2022-11-21 ENCOUNTER — Telehealth: Payer: Self-pay | Admitting: *Deleted

## 2022-11-21 NOTE — Progress Notes (Signed)
Could someone please call the patient to discuss urine culture that PCP performed? Does not look like she read the mychart message or that anyone has called. We will need to change her catheter out when she comes in this week.

## 2022-11-21 NOTE — Telephone Encounter (Signed)
-----   Message from Carver Fila sent at 11/21/2022 10:25 AM EDT ----- Could someone please call the patient to discuss urine culture that PCP performed? Does not look like she read the mychart message or that anyone has called. We will need to change her catheter out when she comes in this week.

## 2022-11-21 NOTE — Telephone Encounter (Signed)
Attempted to reach patient but unable to leave a message on phone listed. Spoke with her son-in-law Ellen Ryan who states, the number that is listed is her late daughter's number and it's active but he has turned it off. Another number was added to contact list but per Ellen Ryan, pt has some difficulty speaking on the phone.   Relayed to Ellen Ryan that patient's urine test from her PCP's office showed bacteria and Rx. For Ciprofloxacin antibiotic was sent to her pharmacy.  Ellen Ryan states that he picked up the antibiotic and she has been taking it since Friday, July 26th. Ellen Ryan will relay message to patient and have her call the office back at [phone].

## 2022-11-22 ENCOUNTER — Encounter: Payer: Self-pay | Admitting: *Deleted

## 2022-11-23 NOTE — Progress Notes (Signed)
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  The patient is visiting her daughter now and lives in [place]. She reported a 2 year history of vulvar ulceration. She was seen by Dr Marice Potter for this problem in April, 2018 and was taken to the OR for an examination under anesthesia on 09/01/16. Extensive ulceration was present. 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     On 09/21/16, she underwent a complete simple vulvectomy.  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 No invasive carcinoma, negative margins. Post-operative course was uneventful and she was discharged with a foley catheter for one week post-op.    She has urinary urgency not resolved by medications. She feels her urine deviates in stream. Since early April, 2019 she developed irritation of the vulva made better with silvadine cream.   Given that she did not tolerate an office examination she was taken to the operating room on Sep 13, 2017 for examination under anesthesia and vulvar biopsies.  Intraoperative findings were significant for subtle acetowhite changes immediately lateral on the right side of the urethral meatus.  There also acetowhite changes to the midline distal vagina.  Vagina was completely agglutinated.  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  The posterior vaginal introitus biopsy revealed benign normal squamous mucosa.   On 10/19/17 she underwent CO2 laser to the urethral meatus.   In May, 2020 she started experiencing a burning sensation at the skin of her introitus/vulva. She had been prescribed clobetasol but had been using silvadine instead of clobetasol. In 10/2018 was recommended to use nightly clobetasol.   Patient represented for care in 07/2021 with sensation of "skin crawling". Had  self treated for possible pinworms. Due to pain limiting exam, EUA and biopsies performed on 08/31/21. Bilateral biopsies were taken showing VIN3 with associated erosion and inflammation. No Paget's disease identified in these biopsies.    Underwent exam under anesthesia on 09/21/2021 as well as vulvar biopsies.  Findings notable for them with complete fusion of the superior labia with only a small, 1-2 cm opening at the posterior fourchette.  Given symptoms suspected to be related to urine being trapped under her fused labia, recommendation was made for suprapubic catheter which was placed in the PACU.  Unfortunately, this got dislodged several days after surgery.  1 vulvar biopsy showed mucosal inflammation and granulation tissue, no dysplasia.  Second vulvar biopsy showed VIN 3, no invasive carcinoma.   MRI pelvis 06/19/22: Unchanged postoperative appearance of the vulva, no evidence of recurrent or metastatic disease in the pelvis.  Multiple uterine fibroids.  Mild thickening of urinary bladder.   06/13/22: Complete simple vulvectomy, lysis of labial adhesions [name]); cystoscopy with bladder biopsy, vaginal graft placement, pudendal block [name])  Operative findings: Vulva notable for a 7 x 5 area of ulcerated appearing tissue, friable in places, covering agglutinated vagina. No discernable vagina with pinpoint hole towards inferior aspect of ulcerated and beefy red tissue where urine seen intermittently. After simple vulvectomy of the entire erythematous and ulcerated tissue, gentle blunt dissection ultimately led to identification of distal vagina and urethral opening. On cystoscopy, some hypertrophy of the bladder mucosa noted.    Redeveloped adhesions and due to increasing difficulty urinating and slow return of prior symptoms, surgery was recommended.  11/01/22: Lysis of labial  adhesions, foley catheter placement. Operative findings: Vulva without lesions. Bilateral vulva almost completely  agglutinated along the midline with two small glandular appearing openings. Dilation of both and separation of the epithelium dividing the two openings from each other allowed for visualization of urethral mucosa. Clear urine noted after foley catheter placement.      Interval History: CBGs have been 200-300s. Saw her PCP on 7/25 and noted new onset cloudy urine. Urine culture showed proteus mirabilis. She has been on Cipro since 7/26.   Patient reports doing well.  Finished antibiotics today.  Had some low pelvic pain this morning.  Otherwise denies pelvic or abdominal pain.  Muscular pain and difficulty she was having have significantly improved since placing the catheter.  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    Past Surgical History:  Procedure Laterality Date   CO2 LASER APPLICATION N/A 10/19/2017   Procedure: CO2 LASER APPLICATION OF THE VULVA;  Surgeon: Adolphus Birchwood, MD;  Location: [facility];  Service: Gynecology;  Laterality: N/A;   CO2 LASER APPLICATION N/A 09/21/2021   Procedure: VAGINOSCOPY, VULVAR BIOPSIES;  Surgeon: Carver Fila, MD;  Location: [facility];  Service: Gynecology;  Laterality: N/A;   CYSTOSCOPY  06/22/2022   Procedure: CYSTOSCOPY with BLADDER BIOPSY;  Surgeon: Marguerita Beards, MD;  Location: [facility];  Service: Gynecology;;   EUA/ VULVA BIOPSY  09-01-2016   dr dove  [facility]   GRAFT APPLICATION  06/22/2022   Procedure: GRAFT APPLICATION;  Surgeon: Marguerita Beards, MD;  Location: [facility];  Service: Gynecology;;   LABIOPLASTY N/A 11/01/2022   Procedure: lysis of adhesions of labia / vulva;  Surgeon: Carver Fila, MD;  Location: [facility];  Service: Gynecology;  Laterality: N/A;   RADICAL VULVECTOMY N/A 09/21/2016   Procedure: RADICAL VULVECTOMY COMPLETE;  Surgeon: Adolphus Birchwood, MD;  Location: [facility];  Service: Gynecology;  Laterality: N/A;   TRANSTHORACIC ECHOCARDIOGRAM  01/16/2017   ef 60-65%,  grade 1 diastolic dysfunction/  very mild AV stenosis with moderately thickened and calcified leaflets (valve area 1.85cm^2)/  moderate LAE   VULVA /PERINEUM BIOPSY N/A 09/13/2017   Procedure: VULVAR BIOPSY;  Surgeon: Adolphus Birchwood, MD;  Location: [facility];  Service: Gynecology;  Laterality: N/A;   VULVA Ples Specter BIOPSY N/A 08/31/2021   Procedure: VULVAR BIOPSY;  Surgeon: Antionette Char, MD;  Location: [facility];  Service: Gynecology;  Laterality: N/A;   VULVECTOMY N/A 06/22/2022   Procedure: WIDE LOCAL EXCISION VULVECTOMY;  Surgeon: Carver Fila, MD;  Location[facility];  Service: Gynecology;  Laterality: N/A;    Family History  Problem Relation Age of Onset   Stroke Mother    Arthritis Mother    Diabetes Father    Breast cancer  Daughter        recurrent, and widely metastatic   Cancer Daughter     Social History   Socioeconomic History   Marital status: Widowed    Spouse name: Not on file   Number of children: Not on file   Years of education: Not on file   Highest education level: Not on file  Occupational History   Not on file  Tobacco Use   Smoking status: Never   Smokeless tobacco: Never  Vaping Use   Vaping status: Never Used  Substance and Sexual Activity   Alcohol use: Not Currently   Drug use: No   Sexual activity: Not Currently    Birth  control/protection: Post-menopausal  Other Topics Concern   Not on file  Social History Narrative   Lives in [place].   Visiting family in [place]   (Daughter--who passed away Oct 23, 2022)   Social Determinants of Health   Financial Resource Strain: Patient Declined (10/30/2022)   Overall Financial Resource Strain (CARDIA)    Difficulty of Paying Living Expenses: Patient declined  Food Insecurity: No Food Insecurity (10/30/2022)   Hunger Vital Sign    Worried About Running Out of Food in the Last Year: Never true    Ran Out of Food in the Last Year: Never true  Transportation Needs: No Transportation Needs (10/30/2022)   PRAPARE - Administrator, Civil Service (Medical): No    Lack of Transportation (Non-Medical): No  Physical Activity: Unknown (10/30/2022)   Exercise Vital Sign    Days of Exercise per Week: 4 days    Minutes of Exercise per Session: Not on file  Stress: Stress Concern Present (10/30/2022)   [employer] of Occupational Health - Occupational Stress Questionnaire    Feeling of Stress : To some extent  Social Connections: Unknown (10/30/2022)   Social Connection and Isolation Panel [NHANES]    Frequency of Communication with Friends and Family: Not on file    Frequency of Social Gatherings with Friends and Family: Not on file    Attends Religious Services: More than 4 times per year    Active Member of [employer] or Organizations: No    Attends Banker Meetings: Never    Marital Status: Widowed    Current Medications:  Current Outpatient Medications:    acetaminophen (TYLENOL) 500 MG tablet, Take 500 mg by mouth every 6 (six) hours as needed., Disp: , Rfl:    amLODipine (NORVASC) 10 MG tablet, Take 1 tablet (10 mg total) by mouth daily., Disp: 90 tablet, Rfl: 3   atorvastatin (LIPITOR) 40 MG tablet, Take 1 tablet (40 mg total) by mouth daily., Disp: 90 tablet, Rfl: 1   Blood Glucose Monitoring Suppl DEVI, 1 each by Does not apply route in the morning and  at bedtime. May substitute to any manufacturer covered by patient's insurance., Disp: 1 each, Rfl: 0   Glucose Blood (BLOOD GLUCOSE TEST STRIPS) STRP, 1 each by In Vitro route in the morning and at bedtime. May substitute to any manufacturer covered by patient's insurance., Disp: 200 strip, Rfl: 0   ibuprofen (ADVIL) 600 MG tablet, Take 600 mg by mouth every 6 (six) hours as needed., Disp: , Rfl:    Lancet Device MISC, 1 each by Does not apply route in the morning, at noon, and at bedtime. May substitute to any manufacturer covered by patient's insurance., Disp: 1 each, Rfl: 0   Lancets Misc. MISC, 1 each by Does not apply route in the  morning and at bedtime. May substitute to any manufacturer covered by patient's insurance., Disp: 200 each, Rfl: 0   Melatonin 10 MG CAPS, Take 10 mg by mouth at bedtime as needed (sleep). (Patient not taking: Reported on 11/16/2022), Disp: , Rfl:    metFORMIN (GLUCOPHAGE) 500 MG tablet, TAKE 1 TABLET BY MOUTH TWICE A DAY WITH A MEAL, Disp: 180 tablet, Rfl: 1   Multiple Vitamin (MULTIVITAMIN WITH MINERALS) TABS tablet, Take 1 tablet by mouth daily. Centrum, Disp: , Rfl:    Omega-3 Fatty Acids (FISH OIL) 1000 MG CPDR, Take 1 capsule by mouth daily., Disp: , Rfl:    polyethylene glycol (MIRALAX / GLYCOLAX) packet, Take 17 g by mouth daily as needed for mild constipation., Disp: , Rfl:    senna (SENOKOT) 8.6 MG TABS tablet, Take 1 tablet (8.6 mg total) by mouth at bedtime. (Patient not taking: Reported on 11/16/2022), Disp: 30 each, Rfl: 0   valsartan (DIOVAN) 80 MG tablet, Take 1 tablet (80 mg total) by mouth daily., Disp: 30 tablet, Rfl: 0  Review of Systems: + constipation, muscle pain/cramp, problem with walking Denies appetite changes, fevers, chills, fatigue, unexplained weight changes. Denies hearing loss, neck lumps or masses, mouth sores, ringing in ears or voice changes. Denies cough or wheezing.  Denies shortness of breath. Denies chest pain or palpitations.  Denies leg swelling. Denies abdominal distention, pain, blood in stools, diarrhea, nausea, vomiting, or early satiety. Denies pain with intercourse, dysuria, frequency, hematuria or incontinence. Denies hot flashes, pelvic pain, vaginal bleeding or vaginal discharge.   Denies joint pain, back pain. Denies itching, rash, or wounds. Denies dizziness, headaches, numbness or seizures. Denies swollen lymph nodes or glands, denies easy bruising or bleeding. Denies anxiety, depression, confusion, or decreased concentration.  Physical Exam: BP (!) 135/58 (BP Location: Left Arm, Patient Position: Sitting)   Pulse 83   Temp 98.1 F (36.7 C) (Oral)   Resp 16   Wt 169 lb (76.7 kg)   SpO2 97%   BMI 35.32 kg/m  General: Alert, oriented, no acute distress. HEENT: Normocephalic, atraumatic, sclera anicteric. Chest: Unlabored breathing on room air. GU: Significant anatomic changes of the vulva that have been previously outlined, different since recent exams.  Foley catheter in place with clear yellow urine in the bag.  Urine specimen taken directly from the catheter prior to its removal.  Sterile 16 French new urinary catheter placed today without difficulty.  Laboratory & Radiologic Studies: None new  Assessment & Plan: Shereka Mcphie is a 67 y.o. woman with history of vulvar dysplasia, recent biopsy showing high-grade vulvar dysplasia, with almost complete labial fusion and obliteration of her vagina.  Now status post vulvar resection as well as vaginal dilation and identification of urethra.  Repeat adhesiolysis and urethral catheter placement performed on 11/01/22.  Patient is overall doing well.  Finished treatment for recent urinary tract infection today although it sounds like the urine was taken from the bag itself.  Repeat culture will be sent from urine drawn straight from the catheter.  New catheter placed today.  Patient has a nursing visit next week.  I will see her in 4 weeks to evaluate  for possible catheter removal.  20 minutes of total time was spent for this patient encounter, including preparation, face-to-face counseling with the patient and coordination of care, and documentation of the encounter.  Eugene Garnet, MD  Division of Gynecologic Oncology  Department of Obstetrics and Gynecology  [facility]

## 2022-11-24 ENCOUNTER — Encounter: Payer: Self-pay | Admitting: Gynecologic Oncology

## 2022-11-24 ENCOUNTER — Other Ambulatory Visit: Payer: Self-pay

## 2022-11-24 ENCOUNTER — Inpatient Hospital Stay: Payer: Medicare Other

## 2022-11-24 ENCOUNTER — Inpatient Hospital Stay: Payer: Medicare Other | Attending: Gynecologic Oncology | Admitting: Gynecologic Oncology

## 2022-11-24 VITALS — BP 135/58 | HR 83 | Temp 98.1°F | Resp 16 | Wt 169.0 lb

## 2022-11-24 DIAGNOSIS — N390 Urinary tract infection, site not specified: Secondary | ICD-10-CM

## 2022-11-24 DIAGNOSIS — R3915 Urgency of urination: Secondary | ICD-10-CM | POA: Diagnosis present

## 2022-11-24 DIAGNOSIS — Z960 Presence of urogenital implants: Secondary | ICD-10-CM

## 2022-11-24 DIAGNOSIS — Q525 Fusion of labia: Secondary | ICD-10-CM | POA: Insufficient documentation

## 2022-11-24 DIAGNOSIS — Z9889 Other specified postprocedural states: Secondary | ICD-10-CM

## 2022-11-24 DIAGNOSIS — D071 Carcinoma in situ of vulva: Secondary | ICD-10-CM

## 2022-11-24 DIAGNOSIS — Z9079 Acquired absence of other genital organ(s): Secondary | ICD-10-CM | POA: Insufficient documentation

## 2022-11-24 NOTE — Patient Instructions (Signed)
It was good to see you today.  You will see one of our nurses next week and I will see you back in a month.  Hopefully we will be able to take the catheter out at that time.  Sent your urine today to make sure that you have cleared your infection.

## 2022-11-27 ENCOUNTER — Other Ambulatory Visit: Payer: Self-pay | Admitting: Gynecologic Oncology

## 2022-11-27 ENCOUNTER — Telehealth: Payer: Self-pay

## 2022-11-27 DIAGNOSIS — N390 Urinary tract infection, site not specified: Secondary | ICD-10-CM

## 2022-11-27 DIAGNOSIS — Z960 Presence of urogenital implants: Secondary | ICD-10-CM

## 2022-11-27 MED ORDER — NITROFURANTOIN MONOHYD MACRO 100 MG PO CAPS
100.0000 mg | ORAL_CAPSULE | Freq: Two times a day (BID) | ORAL | 0 refills | Status: AC
Start: 2022-11-27 — End: ?

## 2022-11-27 NOTE — Telephone Encounter (Signed)
Pt's son in law, Alden Server, aware of urinalysis/culture results. He will get medication today for Nayelli to start.

## 2022-11-27 NOTE — Telephone Encounter (Signed)
-----   Message from Doylene Bode sent at 11/27/2022  8:47 AM EDT ----- Please call the patient and let her know she still has an infection. The culture we obtained is showing a different type of bacteria that is resistant to Cipro, which she took recently. I will be sending in macrobid for her to start now and to take for 5 days. ----- Message ----- From: Leory Plowman, [facility] [place] Sent: 11/25/2022   3:00 PM EDT To: Doylene Bode, NP

## 2022-11-27 NOTE — Progress Notes (Signed)
Urine sample obtained from the catheter after clamping for 15 minutes at recent office visit. Based on culture results, plan for treatment with macrobid. Pt had previously taken cipro prescribed by PCP.

## 2022-11-29 ENCOUNTER — Inpatient Hospital Stay (HOSPITAL_BASED_OUTPATIENT_CLINIC_OR_DEPARTMENT_OTHER): Payer: Medicare Other

## 2022-11-29 VITALS — BP 139/79 | HR 90 | Temp 98.2°F | Resp 16 | Wt 168.0 lb

## 2022-11-29 DIAGNOSIS — D071 Carcinoma in situ of vulva: Secondary | ICD-10-CM

## 2022-11-29 NOTE — Progress Notes (Signed)
Patient seen in the clinic today for a nurse visit. Pt states she feels well, vital signs stable, she is alert and oriented. Denies pain, fever or chills and is still taking her Macrobid as prescribed. Indwelling catheter in place to leg bag gravity draining clear, yellow urine. Pt reports emptying the bag 3-4 times a day when it's full and sometimes she will be up at night emptying the bag as well. Pt again advised and encouraged to change out the leg back to the foley bag for proper gravity at night in supine position to prevent back flow of urine.  Pt still refusing to use a foley bag at night. Pt still prefers the leg bag and verbalized understanding of keeping herself well hydrated and maintaining the leg bag to gravity especially  while supine. Instructed pt to notify the office should she experience any fever, chills, pain, cloudy urine or blood tinged urine or leaking around the catheter and any questions or concerns. Pt verbalized understanding.   Pt also reminded of her follow up appointment with Dr.Tucker on Friday, August 30 th at 4:15.

## 2022-11-29 NOTE — Progress Notes (Unsigned)
No chief complaint on file.  Patient presents to follow-up on her diabetes and blood pressure.  She was seen recently with elevated blood sugars (200's-300's). She had a UTI at that time. She increased metformin dose to 2 BID (2000 mg/d). She denies side effects from metformin. She was given rx for new monitor and supplies.  Blood sugars are running   Hypertension, microalbuminuria:  Her amlodipine dose was cut to 1/2 tablet daily, as valsartan 80mg  was added due to the finding of microalbuminuria. She denies side effects. To these medications. No headaches, dizziness. No edema. BP's are running  BP Readings from Last 3 Encounters:  11/29/22 139/79  11/24/22 (!) 135/58  11/16/22 130/80    UTI:  She has indwelling catheter. She recently saw gyn-onc nurse and had the catheter changed. They did a repeat urine culture 8/2 which showed E.coli infection. She was started on macrobid for 5 day course by gyn-onc on 8/5.  Symptoms?? Appearance of urine?   PMH, PSH, SH reviewed    ROS: no fever, chills, URI symptoms, chest pain, shortness of breath, edema. No headaches, dizziness. No polydipsia, polyuria, hypoglycemia. No vaginal discharge or itch. UPDATE URINE SX   PHYSICAL EXAM:  There were no vitals taken for this visit.  Wt Readings from Last 3 Encounters:  11/29/22 168 lb (76.2 kg)  11/24/22 169 lb (76.7 kg)  11/16/22 168 lb (76.2 kg)   Pleasant, well-appearing female, in good spirits. HEENT: conjunctiva and sclera are clear, EOMI. Neck: no lymphadenopathy or mass Heart: regular rate and rhythm Lungs: clear bilaterally Abdomen: soft, nontender Back: no CVA tenderness Leg bag--urine appears *** Extremities: no edema Neuro: alert and oriented, cranial nerves grossly intact. Psych: normal mood, affect, hygiene and grooming   ASSESSMENT/PLAN:   B-met (med monitor) Needs valsartan RF after labs back, 90d  Needs f/u scheduled (3 mos? When will she be back?)

## 2022-11-30 ENCOUNTER — Encounter: Payer: Self-pay | Admitting: Family Medicine

## 2022-11-30 ENCOUNTER — Ambulatory Visit (INDEPENDENT_AMBULATORY_CARE_PROVIDER_SITE_OTHER): Payer: Commercial Managed Care - HMO | Admitting: Family Medicine

## 2022-11-30 VITALS — BP 120/60 | HR 84 | Ht <= 58 in | Wt 169.0 lb

## 2022-11-30 DIAGNOSIS — Z960 Presence of urogenital implants: Secondary | ICD-10-CM

## 2022-11-30 DIAGNOSIS — I10 Essential (primary) hypertension: Secondary | ICD-10-CM | POA: Diagnosis not present

## 2022-11-30 DIAGNOSIS — R809 Proteinuria, unspecified: Secondary | ICD-10-CM | POA: Diagnosis not present

## 2022-11-30 DIAGNOSIS — E1165 Type 2 diabetes mellitus with hyperglycemia: Secondary | ICD-10-CM | POA: Diagnosis not present

## 2022-11-30 DIAGNOSIS — I152 Hypertension secondary to endocrine disorders: Secondary | ICD-10-CM

## 2022-11-30 DIAGNOSIS — Z5181 Encounter for therapeutic drug level monitoring: Secondary | ICD-10-CM

## 2022-11-30 DIAGNOSIS — N390 Urinary tract infection, site not specified: Secondary | ICD-10-CM

## 2022-11-30 DIAGNOSIS — E1159 Type 2 diabetes mellitus with other circulatory complications: Secondary | ICD-10-CM | POA: Diagnosis not present

## 2022-11-30 NOTE — Patient Instructions (Addendum)
Consider scheduling a nurse visit where you bring your blood pressure monitor with you.  This way we can verify whether or not your monitor is accurate, and make sure that you are using it properly.  Remember to keep your arm (where the monitor is) at heart level (not too high, and not too low.  Usually a kitchen table is a good height to rest your arm on). You can also bring it with you to your next visit, if you prefer.  Your blood pressure at home are much higher.  Here in the office, it was very good. Therefore, continue on your current regimen--full tablet of amlodipine, and 1/2 tablet of valsartan.  I will send the refill of valsartan to [facility] tomorrow if your labs are fine.  Bring your list of blood pressures and blood sugars to every visit.

## 2022-12-01 IMAGING — US US PELVIS COMPLETE
1 series · 13 of 25 positions shown · non-contrast
Comparison: Pelvis CT 06/03/2021, images from pelvic MRI
03/18/2019, report unavailable

CLINICAL DATA: Provided history: Per Dr. Mattouk, Juxcalmhi 3,
vulvar/pelvic pain, "sensation of something crawling in her vagina
and pelvis", pt unable to tolerate transvaginal due to vulvar pain

Vulvar pain. Pain in joint involving pelvic region and thigh,
unspecified laterality. History of vulvar biopsy and vulvectomy.
EXAM:
TRANSABDOMINAL ULTRASOUND OF PELVIS
TECHNIQUE: Transabdominal ultrasound examination of the pelvis was performed
including evaluation of the uterus, ovaries, adnexal regions, and
pelvic cul-de-sac.
Patient declined transvaginal due to the vulvar pain.

[Series 1: us pelvis (transabdominal only) · 13 of 26 slices shown]
[im 1/26]
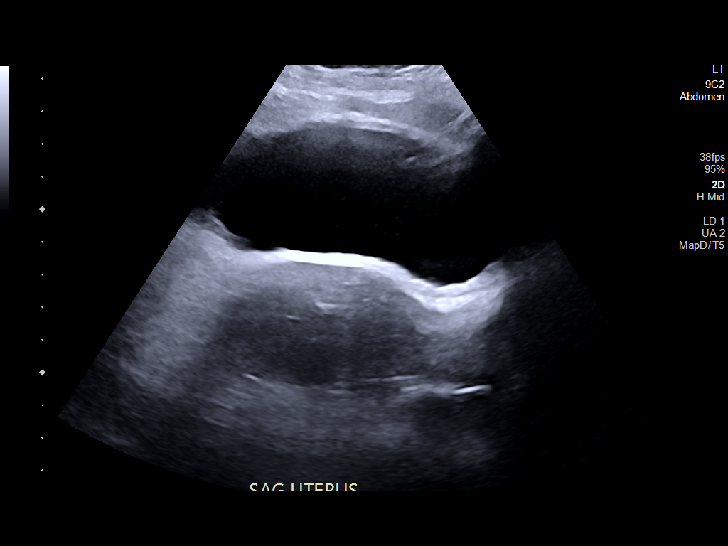
[im 3/26]
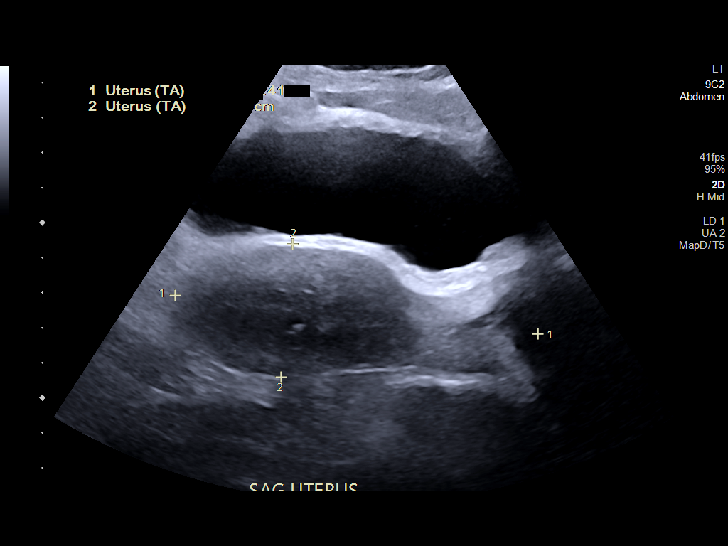
[im 5/26]
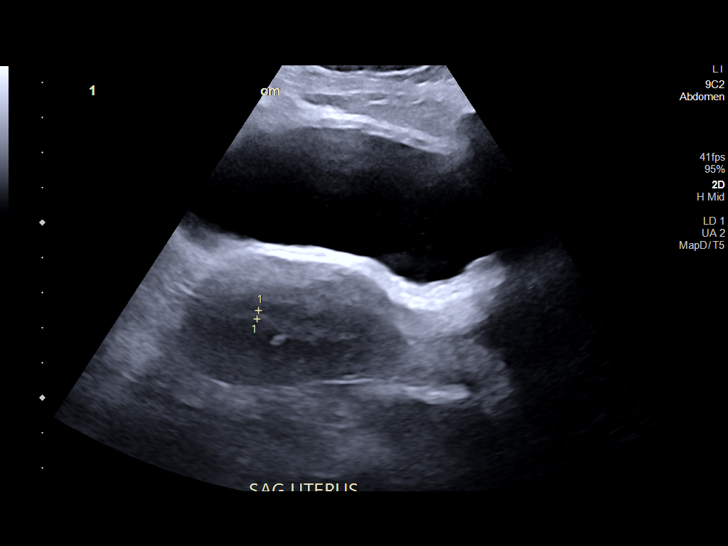
[im 7/26]
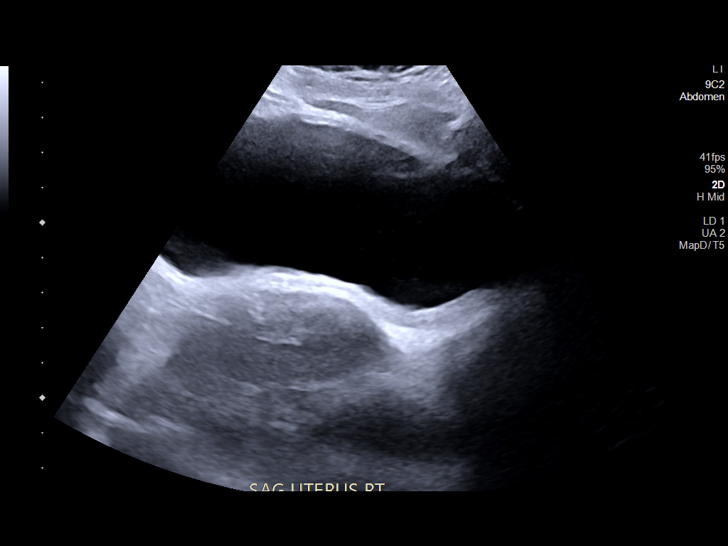
[im 9/26]
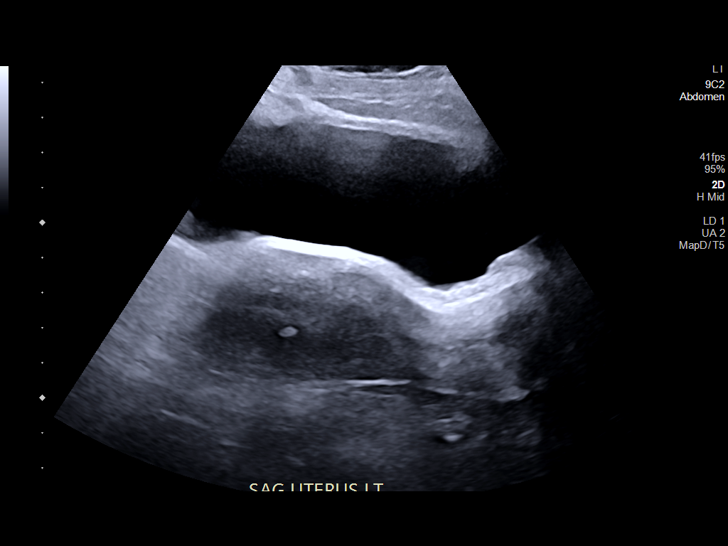
[im 11/26]
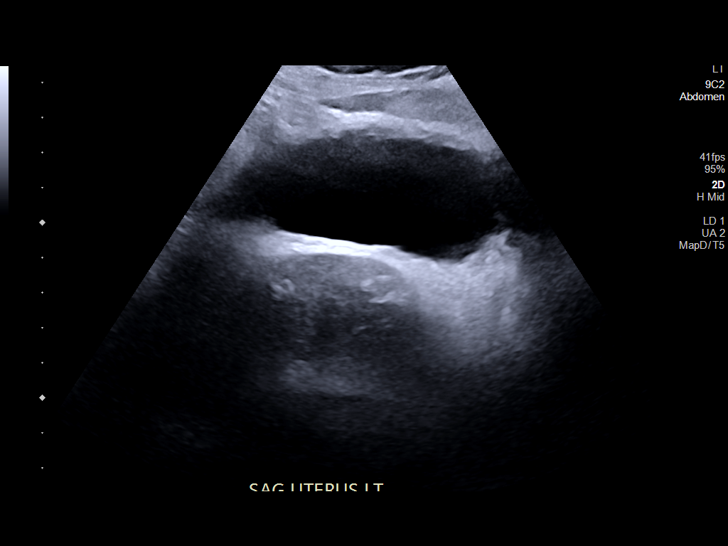
[im 13/26]
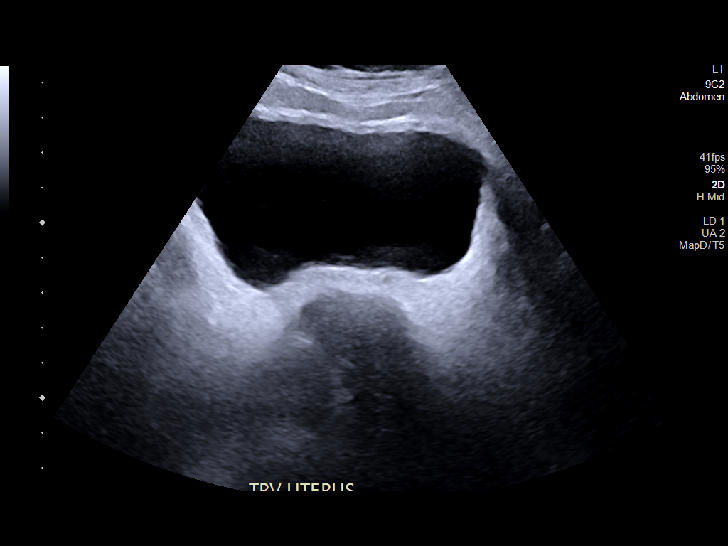
[im 15/26]
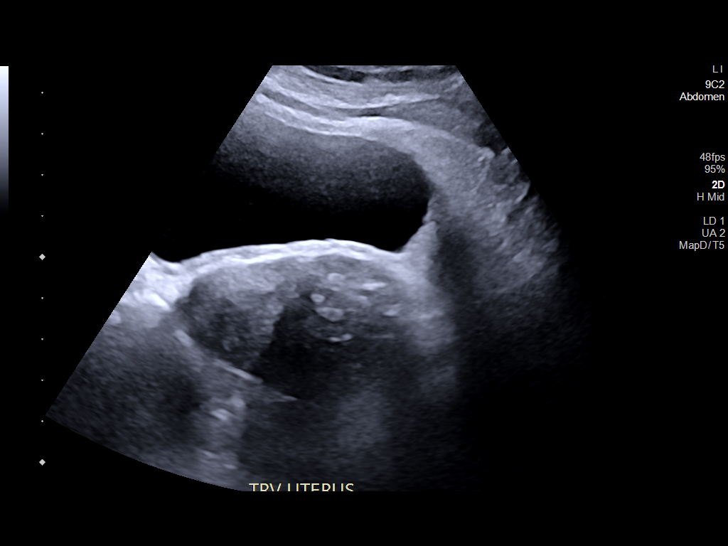
[im 17/26]
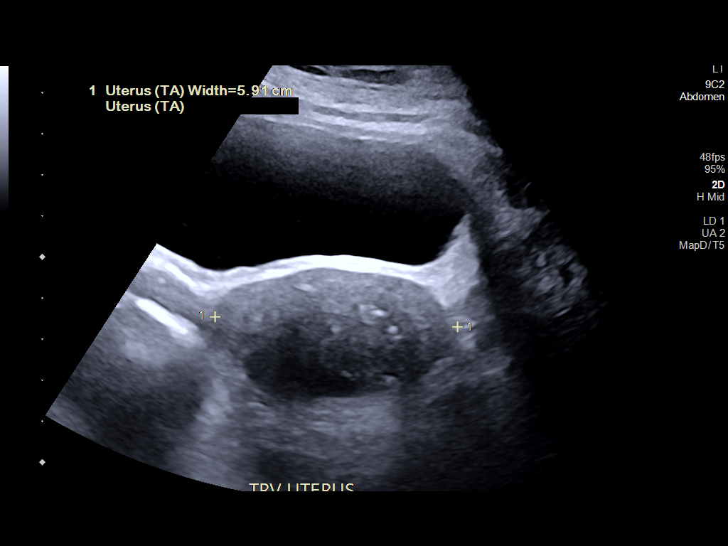
[im 19/26]
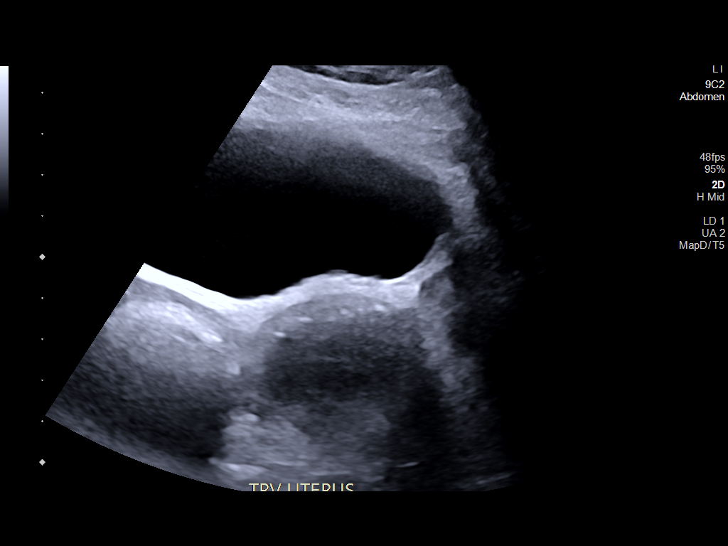
[im 21/26]
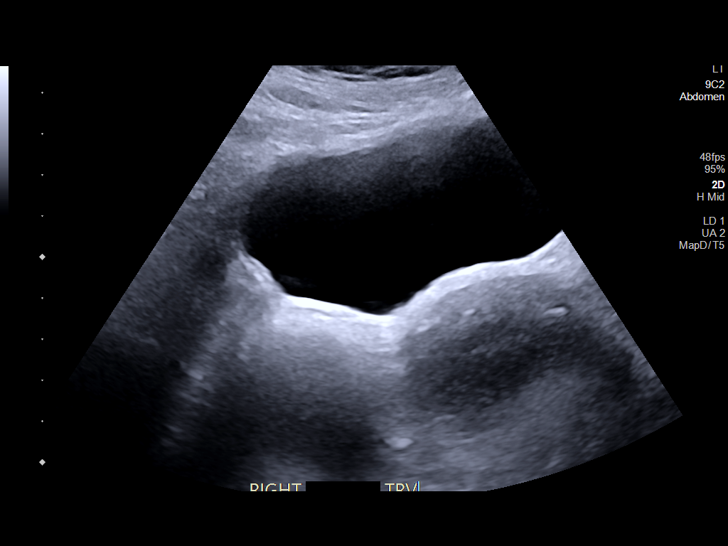
[im 23/26]
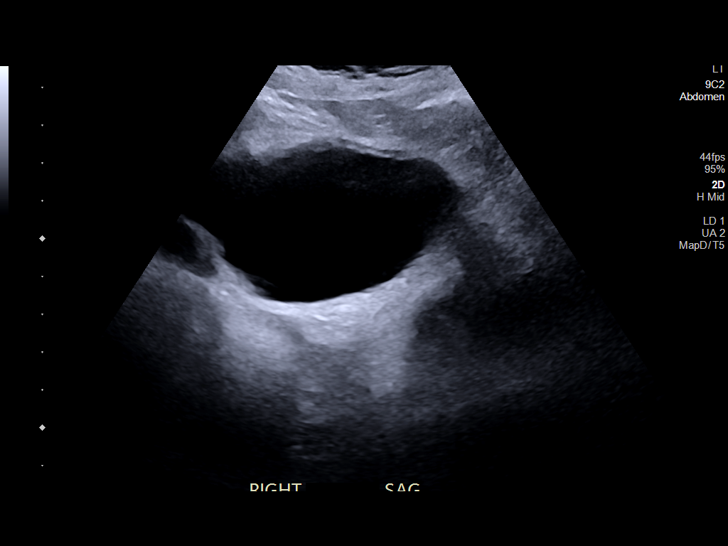
[im 26/26]
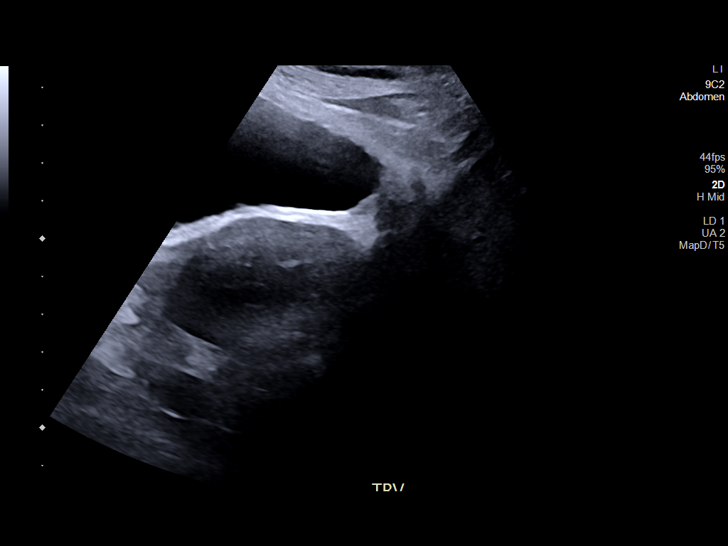

[13 of 25 positions shown; findings below may reference images not displayed]

FINDINGS: Uterus

Measurements: 10.4 x 3.8 x 5.9 cm = volume: 123 mL. There are
multiple coarse calcifications in the uterus, the known fibroids are
difficult to accurately define on this transabdominal exam.
Left-sided body fibroid measures approximately 2.8 cm on my
measurement, although the margins are difficult to accurately
delineate.

Endometrium

Thickness: 3 mm, normal.  No focal abnormality visualized.

Right ovary

Not visualized due to bowel gas.  No adnexal mass.

Left ovary

Not visualized due to bowel gas.  No adnexal mass.

Other findings:  No abnormal free fluid.
IMPRESSION: 1. Patient's known fibroids are difficult to accurately delineate on
this transabdominal exam, coarse calcifications corresponding to
calcified fibroids on prior imaging, approximately 2.8 cm greatest
dimension.
2. No endometrial thickening.
3. Neither ovary is visualized. No evidence of adnexal mass. No
pelvic free fluid.

## 2022-12-01 MED ORDER — VALSARTAN 40 MG PO TABS: 40.0000 mg | ORAL_TABLET | Freq: Every day | ORAL | 1 refills | Status: DC

## 2022-12-01 NOTE — Addendum Note (Signed)
Addended by: Joselyn Arrow on: 12/01/2022 12:48 PM   Modules accepted: Orders

## 2022-12-22 ENCOUNTER — Inpatient Hospital Stay (HOSPITAL_BASED_OUTPATIENT_CLINIC_OR_DEPARTMENT_OTHER): Payer: Medicare Other | Admitting: Gynecologic Oncology

## 2022-12-22 ENCOUNTER — Encounter: Payer: Self-pay | Admitting: Gynecologic Oncology

## 2022-12-22 VITALS — BP 144/69 | HR 98 | Temp 99.2°F | Resp 18 | Wt 171.6 lb

## 2022-12-22 DIAGNOSIS — N905 Atrophy of vulva: Secondary | ICD-10-CM

## 2022-12-22 DIAGNOSIS — Z9889 Other specified postprocedural states: Secondary | ICD-10-CM

## 2022-12-22 DIAGNOSIS — Z960 Presence of urogenital implants: Secondary | ICD-10-CM

## 2022-12-22 DIAGNOSIS — Q525 Fusion of labia: Secondary | ICD-10-CM

## 2022-12-22 MED ORDER — ESTRADIOL 0.1 MG/GM VA CREA: TOPICAL_CREAM | VAGINAL | 5 refills | Status: AC

## 2022-12-22 NOTE — Progress Notes (Signed)
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  The patient is visiting her daughter now and lives in [place]. She reported a 2 year history of vulvar ulceration. She was seen by Dr Marice Potter for this problem in April, 2018 and was taken to the OR for an examination under anesthesia on 09/01/16. Extensive ulceration was present. 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     On 09/21/16, she underwent a complete simple vulvectomy.  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 No invasive carcinoma, negative margins. Post-operative course was uneventful and she was discharged with a foley catheter for one week post-op.    She has urinary urgency not resolved by medications. She feels her urine deviates in stream. Since early April, 2019 she developed irritation of the vulva made better with silvadine cream.   Given that she did not tolerate an office examination she was taken to the operating room on Sep 13, 2017 for examination under anesthesia and vulvar biopsies.  Intraoperative findings were significant for subtle acetowhite changes immediately lateral on the right side of the urethral meatus.  There also acetowhite changes to the midline distal vagina.  Vagina was completely agglutinated.  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  The posterior vaginal introitus biopsy revealed benign normal squamous mucosa.   On 10/19/17 she underwent CO2 laser to the urethral meatus.   In May, 2020 she started experiencing a burning sensation at the skin of her introitus/vulva. She had been prescribed clobetasol but had been using silvadine instead of clobetasol. In 10/2018 was recommended to use nightly clobetasol.   Patient represented for care in 07/2021 with sensation of "skin crawling". Had self  treated for possible pinworms. Due to pain limiting exam, EUA and biopsies performed on 08/31/21. Bilateral biopsies were taken showing VIN3 with associated erosion and inflammation. No Paget's disease identified in these biopsies.    Underwent exam under anesthesia on 09/21/2021 as well as vulvar biopsies.  Findings notable for them with complete fusion of the superior labia with only a small, 1-2 cm opening at the posterior fourchette.  Given symptoms suspected to be related to urine being trapped under her fused labia, recommendation was made for suprapubic catheter which was placed in the PACU.  Unfortunately, this got dislodged several days after surgery.  1 vulvar biopsy showed mucosal inflammation and granulation tissue, no dysplasia.  Second vulvar biopsy showed VIN 3, no invasive carcinoma.   MRI pelvis 06/19/22: Unchanged postoperative appearance of the vulva, no evidence of recurrent or metastatic disease in the pelvis.  Multiple uterine fibroids.  Mild thickening of urinary bladder.   06/13/22: Complete simple vulvectomy, lysis of labial adhesions Ellen Ryan); cystoscopy with bladder biopsy, vaginal graft placement, pudendal block Ellen Ryan)  Operative findings: Vulva notable for a 7 x 5 area of ulcerated appearing tissue, friable in places, covering agglutinated vagina. No discernable vagina with pinpoint hole towards inferior aspect of ulcerated and beefy red tissue where urine seen intermittently. After simple vulvectomy of the entire erythematous and ulcerated tissue, gentle blunt dissection ultimately led to identification of distal vagina and urethral opening. On cystoscopy, some hypertrophy of the bladder mucosa noted.     Redeveloped adhesions and due to increasing difficulty urinating and slow return of prior symptoms, surgery was recommended.   11/01/22: Lysis of  labial adhesions, foley catheter placement. Operative findings: Vulva without lesions. Bilateral vulva almost completely  agglutinated along the midline with two small glandular appearing openings. Dilation of both and separation of the epithelium dividing the two openings from each other allowed for visualization of urethral mucosa. Clear urine noted after foley catheter placement.   Interval History: Doing well.  Denies any pelvic pain or bladder symptoms.  Having some leakage around the catheter.  Endorses normal bowel function.  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    Past Surgical History:  Procedure Laterality Date   CO2 LASER APPLICATION N/A 10/19/2017   Procedure: CO2 LASER APPLICATION OF THE VULVA;  Surgeon: Ellen Birchwood, MD;  Location: [facility];  Service: Gynecology;  Laterality: N/A;   CO2 LASER APPLICATION N/A 09/21/2021   Procedure: VAGINOSCOPY, VULVAR BIOPSIES;  Surgeon: Ellen Fila, MD;  Location[facility];  Service: Gynecology;  Laterality: N/A;   CYSTOSCOPY  06/22/2022   Procedure: CYSTOSCOPY with BLADDER BIOPSY;  Surgeon: Ellen Beards, MD;  Location: [facility];  Service: Gynecology;;   EUA/ VULVA BIOPSY  09-01-2016  dr dove  [facility]   GRAFT APPLICATION  06/22/2022   Procedure: GRAFT APPLICATION;  Surgeon: Ellen Beards, MD;  Location: [facility];  Service: Gynecology;;   LABIOPLASTY N/A 11/01/2022   Procedure: lysis of adhesions of labia / vulva;  Surgeon:  Ellen Fila, MD;  Location: [facility];  Service: Gynecology;  Laterality: N/A;   RADICAL VULVECTOMY N/A 09/21/2016   Procedure: RADICAL VULVECTOMY COMPLETE;  Surgeon: Ellen Birchwood, MD;  Location: [facility];  Service: Gynecology;  Laterality: N/A;   TRANSTHORACIC ECHOCARDIOGRAM  01/16/2017   ef 60-65%,  grade 1 diastolic dysfunction/  very mild AV stenosis with moderately thickened and calcified leaflets (valve area 1.85cm^2)/  moderate LAE   VULVA /PERINEUM BIOPSY N/A 09/13/2017   Procedure: VULVAR BIOPSY;  Surgeon: Ellen Birchwood, MD;  Location: [facility];  Service: Gynecology;  Laterality: N/A;   VULVA Ples Specter BIOPSY N/A 08/31/2021   Procedure: VULVAR BIOPSY;  Surgeon: Ellen Char, MD;  Location: [facility];  Service: Gynecology;  Laterality: N/A;   VULVECTOMY N/A 06/22/2022   Procedure: WIDE LOCAL EXCISION VULVECTOMY;  Surgeon: Ellen Fila, MD;  Location: [facility];  Service: Gynecology;  Laterality: N/A;    Family History  Problem Relation Age of Onset   Stroke Mother    Arthritis Mother    Diabetes Father    Breast cancer Daughter        recurrent, and widely metastatic   Cancer Daughter     Social History   Socioeconomic History   Marital status: Widowed    Spouse name: Not on file   Number of children: Not on file  Years of education: Not on file   Highest education level: Not on file  Occupational History   Not on file  Tobacco Use   Smoking status: Never   Smokeless tobacco: Never  Vaping Use   Vaping status: Never Used  Substance and Sexual Activity   Alcohol use: Not Currently   Drug use: No   Sexual activity: Not Currently    Birth control/protection: Post-menopausal  Other Topics Concern   Not on file  Social History Narrative   Lives in [place].   Visiting family in [place]   (Daughter--who passed away 10-11-22)   Social Determinants of Health   Financial Resource Strain: Patient Declined  (10/30/2022)   Overall Financial Resource Strain (CARDIA)    Difficulty of Paying Living Expenses: Patient declined  Food Insecurity: No Food Insecurity (11/30/2022)   Hunger Vital Sign    Worried About Running Out of Food in the Last Year: Never true    Ran Out of Food in the Last Year: Never true  Transportation Needs: No Transportation Needs (11/30/2022)   PRAPARE - Administrator, Civil Service (Medical): No    Lack of Transportation (Non-Medical): No  Physical Activity: Unknown (11/30/2022)   Exercise Vital Sign    Days of Exercise per Week: 3 days    Minutes of Exercise per Session: Not on file  Stress: Stress Concern Present (10/30/2022)   Ellen Ryan [employer] Occupational [employer] - Occupational Stress Questionnaire    Feeling of Stress : To some extent  Social Connections: Moderately Isolated (11/30/2022)   Social Connection and Isolation Panel [NHANES]    Frequency of Communication with Friends and Family: Three times a week    Frequency of Social Gatherings with Friends and Family: Once a week    Attends Religious Services: More than 4 times per year    Active Member of [employer] or Organizations: No    Attends Banker Meetings: Never    Marital Status: Widowed    Current Medications:  Current Outpatient Medications:    estradiol (ESTRACE VAGINAL) 0.1 MG/GM vaginal cream, Apply a dime size amount of cream on your finger at bedtime and apply around the urinary opening on the vulva every night for the next month., Disp: 42.5 g, Rfl: 5   acetaminophen (TYLENOL) 500 MG tablet, Take 500 mg by mouth every 6 (six) hours as needed. (Patient not taking: Reported on 11/30/2022), Disp: , Rfl:    amLODipine (NORVASC) 10 MG tablet, Take 1 tablet (10 mg total) by mouth daily., Disp: 90 tablet, Rfl: 3   atorvastatin (LIPITOR) 40 MG tablet, Take 1 tablet (40 mg total) by mouth daily., Disp: 90 tablet, Rfl: 1   Blood Glucose Monitoring Suppl DEVI, 1 each by Does not apply route in  the morning and at bedtime. May substitute to any manufacturer covered by patient's insurance., Disp: 1 each, Rfl: 0   Glucose Blood (BLOOD GLUCOSE TEST STRIPS) STRP, 1 each by In Vitro route in the morning and at bedtime. May substitute to any manufacturer covered by patient's insurance., Disp: 200 strip, Rfl: 0   ibuprofen (ADVIL) 600 MG tablet, Take 600 mg by mouth every 6 (six) hours as needed., Disp: , Rfl:    Melatonin 10 MG CAPS, Take 10 mg by mouth at bedtime as needed (sleep). (Patient not taking: Reported on 11/16/2022), Disp: , Rfl:    metFORMIN (GLUCOPHAGE) 500 MG tablet, TAKE 1 TABLET BY MOUTH TWICE A DAY WITH A MEAL, Disp: 180 tablet, Rfl:  1   Multiple Vitamin (MULTIVITAMIN WITH MINERALS) TABS tablet, Take 1 tablet by mouth daily. Centrum, Disp: , Rfl:    nitrofurantoin, macrocrystal-monohydrate, (MACROBID) 100 MG capsule, Take 1 capsule (100 mg total) by mouth 2 (two) times daily., Disp: 14 capsule, Rfl: 0   Omega-3 Fatty Acids (FISH OIL) 1000 MG CPDR, Take 1 capsule by mouth daily., Disp: , Rfl:    polyethylene glycol (MIRALAX / GLYCOLAX) packet, Take 17 g by mouth daily as needed for mild constipation. (Patient not taking: Reported on 11/30/2022), Disp: , Rfl:    valsartan (DIOVAN) 40 MG tablet, Take 1 tablet (40 mg total) by mouth daily., Disp: 90 tablet, Rfl: 1  Review of Systems: Denies appetite changes, fevers, chills, fatigue, unexplained weight changes. Denies hearing loss, neck lumps or masses, mouth sores, ringing in ears or voice changes. Denies cough or wheezing.  Denies shortness of breath. Denies chest pain or palpitations. Denies leg swelling. Denies abdominal distention, pain, blood in stools, constipation, diarrhea, nausea, vomiting, or early satiety. Denies pain with intercourse, dysuria, frequency, hematuria or incontinence. Denies hot flashes, pelvic pain, vaginal bleeding or vaginal discharge.   Denies joint pain, back pain or muscle pain/cramps. Denies itching,  rash, or wounds. Denies dizziness, headaches, numbness or seizures. Denies swollen lymph nodes or glands, denies easy bruising or bleeding. Denies anxiety, depression, confusion, or decreased concentration.  Physical Exam: BP (!) 144/69   Pulse 98   Temp 99.2 F (37.3 C)   Resp 18   Wt 171 lb 9.6 oz (77.8 kg)   SpO2 96%   BMI 35.86 kg/m  General: Alert, oriented, no acute distress. HEENT: Normocephalic, atraumatic, sclera anicteric. Chest: Unlabored breathing on room air. GU: Significant changes of the vulva secondary to prior surgeries and procedures.  Foley catheter with yellow urine, removed with some difficulty secondary to narrowing of the distal urethra.  Small amount of bleeding after Foley catheter removal made hemostatic with pressure.  Picture taken to show the patient.  We also showed her with a mirror.  Applied gel around the urethra to show her how we would like Vaseline and vaginal estrogen to be applied.    Laboratory & Radiologic Studies: None new  Assessment & Plan: Haylyn Jablon is a 67 y.o. woman with history of vulvar dysplasia, recent biopsy showing high-grade vulvar dysplasia, with almost complete labial fusion and obliteration of her vagina.  Now status post vulvar resection as well as vaginal dilation and identification of urethra.  Repeat adhesiolysis and urethral catheter placement performed on 11/01/22.   Patient is overall doing well.  Foley catheter removed today.  Plan to use daily estrogen as well as Vaseline to hopefully help prevent reagglutination.  Will plan to see her back in a month to reassess urethra prior to her leaving on international trip.  18 minutes of total time was spent for this patient encounter, including preparation, face-to-face counseling with the patient and coordination of care, and documentation of the encounter.  Eugene Garnet, MD  Division of Gynecologic Oncology  Department of Obstetrics and Gynecology  [facility]

## 2022-12-22 NOTE — Patient Instructions (Signed)
We will see you back in 1 month before you leave on your trip.  Please use Vaseline during the day around the opening where the urine comes out to help prevent things from closing.  Please put a small amount of vaginal estrogen on your finger and apply this around the opening at night every night for the next month until we see you back.

## 2022-12-29 ENCOUNTER — Encounter: Payer: Self-pay | Admitting: Nurse Practitioner

## 2022-12-29 ENCOUNTER — Telehealth (INDEPENDENT_AMBULATORY_CARE_PROVIDER_SITE_OTHER): Payer: Commercial Managed Care - HMO | Admitting: Nurse Practitioner

## 2022-12-29 VITALS — BP 135/74 | Wt 168.0 lb

## 2022-12-29 DIAGNOSIS — J0100 Acute maxillary sinusitis, unspecified: Secondary | ICD-10-CM

## 2022-12-29 DIAGNOSIS — R051 Acute cough: Secondary | ICD-10-CM | POA: Diagnosis not present

## 2022-12-29 MED ORDER — GUAIFENESIN ER 600 MG PO TB12
1200.0000 mg | ORAL_TABLET | Freq: Two times a day (BID) | ORAL | 0 refills | Status: AC | PRN
Start: 2022-12-29 — End: ?

## 2022-12-29 MED ORDER — AMOXICILLIN-POT CLAVULANATE 875-125 MG PO TABS
1.0000 | ORAL_TABLET | Freq: Two times a day (BID) | ORAL | 0 refills | Status: AC
Start: 2022-12-29 — End: ?

## 2022-12-29 MED ORDER — BENZONATATE 200 MG PO CAPS: 200.0000 mg | ORAL_CAPSULE | Freq: Two times a day (BID) | ORAL | 0 refills | Status: DC | PRN

## 2022-12-29 NOTE — Progress Notes (Signed)
Virtual Visit Encounter mychart visit.   I connected with  Ellen Ryan on 12/29/22 at 11:15 AM EDT by secure video and audio telemedicine application. I verified that I am speaking with the correct person using two identifiers.   I introduced myself as a Publishing rights manager with the practice. The limitations of evaluation and management by telemedicine discussed with the patient and the availability of in person appointments. The patient expressed verbal understanding and consent to proceed.  Participating parties in this visit include: Myself and patient and partner  The patient is: Patient Location: Home I am: Provider Location: Office/Clinic Subjective:    CC and HPI: Ellen Ryan is a 67 y.o. year old female presenting for new evaluation and treatment of upper respiratory symptoms. Patient reports the following:  Ellen Ryan tells me she has a cough, congestion, sinus pain and pressure, rhinorrhea, and generalized feeling of unwell for 4-5 days. She had a subjective fever last night. She reports she has been taking over the counter medications to help with her symptoms. She also reports pain in her stomach with coughing. She has taken 3 COVID tests and they are all negative. She has received all COVID vaccines. She denies diarrhea or nausea. No shortness of breath.   Past medical history, Surgical history, Family history not pertinant except as noted below, Social history, Allergies, and medications have been entered into the medical record, reviewed, and corrections made.   Review of Systems:  All review of systems negative except what is listed in the HPI  Objective:    Alert and oriented x 4 Speaking in clear sentences with no shortness of breath. Wet, productive cough present. Audibly congested. No distress.  Impression and Recommendations:    Problem List Items Addressed This Visit     Acute non-recurrent maxillary sinusitis - Primary    Symptoms are concerning for  sinusitis. She has tested negative for COVID three times, therefore, I feel this is less likely. Given her age, comorbidities, and severity of symptoms I recommend we start antibiotic therapy earlier than typical to help her overcome this. I have asked her to let us know if her symptoms are not improving into next week and we can consider in office COVID testing or chest x-ray based on symptoms. Her cough is productive and very wet sounding. I will send azithromycin, mucinex, and tessalon for symptoms. I recommend staying at home to protect herself from secondary infections. Drink plenty of fluids and rest as much as possible. Take tylenol for fevers.       Relevant Medications   benzonatate (TESSALON) 200 MG capsule   guaiFENesin (MUCINEX) 600 MG 12 hr tablet   amoxicillin-clavulanate (AUGMENTIN) 875-125 MG tablet   Cough   Relevant Medications   benzonatate (TESSALON) 200 MG capsule   guaiFENesin (MUCINEX) 600 MG 12 hr tablet   amoxicillin-clavulanate (AUGMENTIN) 875-125 MG tablet    orders and follow up as documented in EMR I discussed the assessment and treatment plan with the patient. The patient was provided an opportunity to ask questions and all were answered. The patient agreed with the plan and demonstrated an understanding of the instructions.   The patient was advised to call back or seek an in-person evaluation if the symptoms worsen or if the condition fails to improve as anticipated.  Follow-Up: If not better by next week recommend coming in for covid testing and consider CXR  I provided 22 minutes of non-face-to-face interaction with this non face-to-face encounter including intake, same-day documentation, and  chart review.   Tollie Eth, NP , DNP, AGNP-c [facility]

## 2022-12-29 NOTE — Assessment & Plan Note (Signed)
Symptoms are concerning for sinusitis. She has tested negative for COVID three times, therefore, I feel this is less likely. Given her age, comorbidities, and severity of symptoms I recommend we start antibiotic therapy earlier than typical to help her overcome this. I have asked her to let us know if her symptoms are not improving into next week and we can consider in office COVID testing or chest x-ray based on symptoms. Her cough is productive and very wet sounding. I will send azithromycin, mucinex, and tessalon for symptoms. I recommend staying at home to protect herself from secondary infections. Drink plenty of fluids and rest as much as possible. Take tylenol for fevers.

## 2023-01-08 ENCOUNTER — Inpatient Hospital Stay: Payer: Commercial Managed Care - HMO

## 2023-01-08 ENCOUNTER — Inpatient Hospital Stay: Payer: Commercial Managed Care - HMO | Attending: Gynecologic Oncology | Admitting: Gynecologic Oncology

## 2023-01-08 ENCOUNTER — Telehealth: Payer: Self-pay | Admitting: *Deleted

## 2023-01-08 ENCOUNTER — Ambulatory Visit (HOSPITAL_COMMUNITY)
Admission: RE | Admit: 2023-01-08 | Discharge: 2023-01-08 | Disposition: A | Discharge status: Home / Self Care | Payer: Medicare Other | Source: Ambulatory Visit | Attending: Gynecologic Oncology | Admitting: Gynecologic Oncology

## 2023-01-08 VITALS — BP 135/77 | HR 94 | Temp 98.7°F | Resp 18 | Wt 175.4 lb

## 2023-01-08 DIAGNOSIS — Z9889 Other specified postprocedural states: Secondary | ICD-10-CM | POA: Insufficient documentation

## 2023-01-08 DIAGNOSIS — R339 Retention of urine, unspecified: Secondary | ICD-10-CM | POA: Insufficient documentation

## 2023-01-08 DIAGNOSIS — C519 Malignant neoplasm of vulva, unspecified: Secondary | ICD-10-CM | POA: Diagnosis not present

## 2023-01-08 DIAGNOSIS — Z9079 Acquired absence of other genital organ(s): Secondary | ICD-10-CM | POA: Insufficient documentation

## 2023-01-08 DIAGNOSIS — Q525 Fusion of labia: Secondary | ICD-10-CM | POA: Diagnosis present

## 2023-01-08 DIAGNOSIS — N320 Bladder-neck obstruction: Secondary | ICD-10-CM | POA: Insufficient documentation

## 2023-01-08 LAB — URINALYSIS, COMPLETE (UACMP) WITH MICROSCOPIC
Bilirubin Urine: NEGATIVE
Glucose, UA: NEGATIVE mg/dL
Ketones, ur: NEGATIVE mg/dL
Nitrite: NEGATIVE
Protein, ur: NEGATIVE mg/dL
Specific Gravity, Urine: 1.004 — ABNORMAL LOW (ref 1.005–1.030)
pH: 6 (ref 5.0–8.0)

## 2023-01-08 LAB — CBC (CANCER CENTER ONLY)
HCT: 33.4 % — ABNORMAL LOW (ref 36.0–46.0)
Hemoglobin: 10.6 g/dL — ABNORMAL LOW (ref 12.0–15.0)
MCH: 22.2 pg — ABNORMAL LOW (ref 26.0–34.0)
MCHC: 31.7 g/dL (ref 30.0–36.0)
MCV: 70 fL — ABNORMAL LOW (ref 80.0–100.0)
Platelet Count: 354 10*3/uL (ref 150–400)
RBC: 4.77 MIL/uL (ref 3.87–5.11)
RDW: 16.2 % — ABNORMAL HIGH (ref 11.5–15.5)
WBC Count: 8.3 10*3/uL (ref 4.0–10.5)
nRBC: 0 % (ref 0.0–0.2)

## 2023-01-08 LAB — BASIC METABOLIC PANEL
Anion gap: 8 (ref 5–15)
BUN: 8 mg/dL (ref 8–23)
CO2: 26 mmol/L (ref 22–32)
Calcium: 9.7 mg/dL (ref 8.9–10.3)
Chloride: 91 mmol/L — ABNORMAL LOW (ref 98–111)
Creatinine, Ser: 0.62 mg/dL (ref 0.44–1.00)
GFR, Estimated: >60 mL/min (ref >60)
Glucose, Bld: 112 mg/dL — ABNORMAL HIGH (ref 70–99)
Potassium: 3.8 mmol/L (ref 3.5–5.1)
Sodium: 125 mmol/L — ABNORMAL LOW (ref 135–145)

## 2023-01-08 MED ORDER — IOHEXOL 300 MG/ML  SOLN
100.0000 mL | Freq: Once | INTRAMUSCULAR | Status: AC | PRN
  Administered 2023-01-08: 100 mL via INTRAVENOUS

## 2023-01-08 NOTE — Telephone Encounter (Signed)
Spoke with Ms. Wolf and son in law Alden Server. Pt states she has been unable to empty her bladder fully. States the symptoms started 1 week ago and have become progressively worse. Pt also states constipation and is taking stool softeners and Miralax this morning. Pt denies fever, but is having bladder discomfort. Relayed message to Warner Mccreedy, NP and appt. Was made for patient to be seen this morning.  Ms. Griesbach agreed to appointment.

## 2023-01-08 NOTE — Progress Notes (Signed)
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  The patient is visiting her daughter now and lives in [place]. She reported a 2 year history of vulvar ulceration. She was seen by Dr Ellen Ryan for this problem in April, 2018 and was taken to the OR for an examination under anesthesia on 09/01/16. 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     On 09/21/16, she underwent a complete simple vulvectomy.  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 No invasive carcinoma, negative margins. Post-operative course was uneventful and she was discharged with a foley catheter for one week post-op.    She has urinary urgency not resolved by medications. She feels her urine deviates in stream. Since early April, 2019 she developed irritation of the vulva made better with silvadine cream.   Given that she did not tolerate an office examination she was taken to the operating room on Sep 13, 2017 for examination under anesthesia and vulvar biopsies.  Intraoperative findings were significant for subtle acetowhite changes immediately lateral on the right side of the urethral meatus.  There also acetowhite changes to the midline distal vagina.  Vagina was completely agglutinated.  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  The posterior vaginal introitus biopsy revealed benign normal squamous mucosa.   On 10/19/17 she underwent CO2 laser to the urethral meatus.   In May, 2020 she started experiencing a burning sensation at the skin of her introitus/vulva. She had been prescribed clobetasol but had been using silvadine instead of clobetasol. In 10/2018 was recommended to use nightly clobetasol.   Patient represented for care in 07/2021 with sensation of "skin crawling".  Had self treated for possible pinworms. Due to pain limiting exam, EUA and biopsies performed on 08/31/21. Bilateral biopsies were taken showing VIN3 with associated erosion and inflammation. No Paget's disease identified in these biopsies.    Underwent exam under anesthesia on 09/21/2021 as well as vulvar biopsies.  Findings notable for them with complete fusion of the superior labia with only a small, 1-2 cm opening at the posterior fourchette.  Given symptoms suspected to be related to urine being trapped under her fused labia, recommendation was made for suprapubic catheter which was placed in the PACU.  Unfortunately, this got dislodged several days after surgery.  1 vulvar biopsy showed mucosal inflammation and granulation tissue, no dysplasia.  Second vulvar biopsy showed VIN 3, no invasive carcinoma.   MRI pelvis 06/19/22: Unchanged postoperative appearance of the vulva, no evidence of recurrent or metastatic disease in the pelvis.  Multiple uterine fibroids.  Mild thickening of urinary bladder.   06/13/22: Complete simple vulvectomy, lysis of labial adhesions Ellen Ryan); cystoscopy with bladder biopsy, vaginal graft placement, pudendal block Ellen Ryan)  Operative findings: Vulva notable for a 7 x 5 area of ulcerated appearing tissue, friable in places, covering agglutinated vagina. No discernable vagina with pinpoint hole towards inferior aspect of ulcerated and beefy red tissue where urine seen intermittently. After simple vulvectomy of the entire erythematous and ulcerated tissue, gentle blunt dissection ultimately led to identification of distal vagina and urethral opening. On cystoscopy, some hypertrophy of the bladder mucosa noted.     Redeveloped adhesions and due to increasing difficulty urinating and slow return of prior symptoms, surgery was recommended.   11/01/22: Lysis of  labial adhesions, foley catheter placement. Operative findings: Vulva without lesions. Bilateral vulva almost  completely agglutinated along the midline with two small glandular appearing openings. Dilation of both and separation of the epithelium dividing the two openings from each other allowed for visualization of urethral mucosa. Clear urine noted after foley catheter placement.   Last seen by Dr. Pricilla Ryan on 12/22/22 with foley removal at that time.   Interval History: Patient presents to the office today due to difficulty voiding post foley removal at her visit on 8/30. She states she has not voided normally since foley removal. She states her urine dribbles out. If she strains to have a bowel movement, she can get more urine out. She reports moderate to severe lower abdominal and pelvic pain/pressure with no relief. No fever or chills. Last checked her blood sugar this past Friday and in mid 100s. Tolerating diet with no nausea or emesis. Bowel movements have been normal for her. No other concerns voiced.   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;  Service: Gynecology;  Laterality: N/A;   CO2 LASER APPLICATION N/A 09/21/2021   Procedure: VAGINOSCOPY, VULVAR BIOPSIES;  Surgeon: Ellen Fila, MD;  Location: [facility];  Service:  Gynecology;  Laterality: N/A;   CYSTOSCOPY  06/22/2022   Procedure: CYSTOSCOPY with BLADDER BIOPSY;  Surgeon: Marguerita Beards, MD;  Location: [facility];  Service: Gynecology;;   EUA/ VULVA BIOPSY  09-01-2016  dr dove  [facility]   GRAFT APPLICATION  06/22/2022   Procedure: GRAFT APPLICATION;  Surgeon: Marguerita Beards, MD;  Location: [facility];  Service: Gynecology;;   LABIOPLASTY N/A 11/01/2022   Procedure: lysis of adhesions of labia / vulva;  Surgeon: Ellen Fila, MD;  Location: [facility];  Service: Gynecology;  Laterality: N/A;   RADICAL VULVECTOMY N/A 09/21/2016   Procedure: RADICAL VULVECTOMY COMPLETE;  Surgeon: Ellen Birchwood, MD;  Location: [facility];  Service: Gynecology;  Laterality: N/A;   TRANSTHORACIC ECHOCARDIOGRAM  01/16/2017   ef 60-65%,  grade 1 diastolic dysfunction/  very mild AV stenosis with moderately thickened and calcified leaflets (valve area 1.85cm^2)/  moderate LAE   VULVA /PERINEUM BIOPSY N/A 09/13/2017   Procedure: VULVAR BIOPSY;  Surgeon: Ellen Birchwood, MD;  Location: [facility];  Service: Gynecology;  Laterality: N/A;   VULVA Ples Specter BIOPSY N/A 08/31/2021   Procedure: VULVAR BIOPSY;  Surgeon: Antionette Char, MD;  Location: [facility];  Service: Gynecology;  Laterality: N/A;   VULVECTOMY N/A 06/22/2022   Procedure: WIDE LOCAL EXCISION VULVECTOMY;  Surgeon: Ellen Fila, MD;  Location: [facility];  Service: Gynecology;  Laterality: N/A;    Family History  Problem Relation Age of Onset   Stroke Mother    Arthritis Mother    Diabetes Father    Breast cancer Daughter        recurrent, and widely metastatic   Cancer Daughter     Social History   Socioeconomic History   Marital status: Widowed    Spouse name: Not on file   Number of children: Not on file   Years of education: Not on file   Highest education level: Not on file  Occupational History   Not on file   Tobacco Use   Smoking status: Never   Smokeless tobacco: Never  Vaping Use   Vaping status: Never Used  Substance and Sexual Activity   Alcohol use: Not Currently   Drug use: No   Sexual activity: Not Currently    Birth control/protection: Post-menopausal  Other Topics Concern   Not on file  Social History Narrative   Lives in [place].   Visiting family in [place]   (Daughter--who passed away 2022/11/09)   Social Determinants of Health   Financial Resource Strain: Patient Declined (10/30/2022)   Overall Financial Resource Strain (CARDIA)    Difficulty of Paying Living Expenses: Patient declined  Food Insecurity: No Food Insecurity (11/30/2022)   Hunger Vital Sign    Worried About Running Out of Food in the Last Year: Never true    Ran Out of Food in the Last Year: Never true  Transportation Needs: No Transportation Needs (11/30/2022)   PRAPARE - Administrator, Civil Service (Medical): No    Lack of Transportation (Non-Medical): No  Physical Activity: Unknown (11/30/2022)   Exercise Vital Sign    Days of Exercise per Week: 3 days    Minutes of Exercise per Session: Not on file  Stress: Stress Concern Present (10/30/2022)   [employer] of Occupational Health - Occupational Stress Questionnaire    Feeling of Stress : To some extent  Social Connections: Moderately Isolated (11/30/2022)   Social Connection and Isolation Panel [NHANES]    Frequency of Communication with Friends and Family: Three times a week    Frequency of Social Gatherings with Friends and Family: Once a week    Attends Religious Services: More than 4 times per year    Active Member of [employer] or Organizations: No    Attends Banker Meetings: Never    Marital Status: Widowed    Current Medications:  Current Outpatient Medications:    acetaminophen (TYLENOL) 500 MG tablet, Take 500 mg by mouth every 6 (six) hours as needed., Disp: , Rfl:    amLODipine (NORVASC) 10 MG tablet, Take 1 tablet (10  mg total) by mouth daily., Disp: 90 tablet, Rfl: 3   amoxicillin-clavulanate (AUGMENTIN) 875-125 MG tablet, Take 1 tablet by mouth 2 (two) times daily., Disp: 10 tablet, Rfl: 0   atorvastatin (LIPITOR) 40 MG tablet, Take 1 tablet (40 mg total) by mouth daily., Disp: 90 tablet, Rfl: 1   benzonatate (TESSALON) 200 MG capsule, Take 1 capsule (200 mg total) by mouth 2 (two) times daily as needed for cough., Disp: 20 capsule, Rfl: 0   Blood Glucose Monitoring Suppl DEVI, 1 each by Does not apply route in the morning and at bedtime. May substitute to any manufacturer covered by patient's insurance., Disp: 1 each, Rfl: 0   estradiol (ESTRACE VAGINAL) 0.1 MG/GM vaginal cream, Apply a dime size  amount of cream on your finger at bedtime and apply around the urinary opening on the vulva every night for the next month., Disp: 42.5 g, Rfl: 5   Glucose Blood (BLOOD GLUCOSE TEST STRIPS) STRP, 1 each by In Vitro route in the morning and at bedtime. May substitute to any manufacturer covered by patient's insurance., Disp: 200 strip, Rfl: 0   guaiFENesin (MUCINEX) 600 MG 12 hr tablet, Take 2 tablets (1,200 mg total) by mouth 2 (two) times daily as needed for cough or to loosen phlegm., Disp: 24 tablet, Rfl: 0   ibuprofen (ADVIL) 600 MG tablet, Take 600 mg by mouth every 6 (six) hours as needed., Disp: , Rfl:    Melatonin 10 MG CAPS, Take 10 mg by mouth at bedtime as needed (sleep)., Disp: , Rfl:    metFORMIN (GLUCOPHAGE) 500 MG tablet, TAKE 1 TABLET BY MOUTH TWICE A DAY WITH A MEAL, Disp: 180 tablet, Rfl: 1   Multiple Vitamin (MULTIVITAMIN WITH MINERALS) TABS tablet, Take 1 tablet by mouth daily. Centrum, Disp: , Rfl:    nitrofurantoin, macrocrystal-monohydrate, (MACROBID) 100 MG capsule, Take 1 capsule (100 mg total) by mouth 2 (two) times daily. (Patient not taking: Reported on 12/29/2022), Disp: 14 capsule, Rfl: 0   Omega-3 Fatty Acids (FISH OIL) 1000 MG CPDR, Take 1 capsule by mouth daily., Disp: , Rfl:     polyethylene glycol (MIRALAX / GLYCOLAX) packet, Take 17 g by mouth daily as needed for mild constipation. (Patient not taking: Reported on 11/30/2022), Disp: , Rfl:    valsartan (DIOVAN) 40 MG tablet, Take 1 tablet (40 mg total) by mouth daily., Disp: 90 tablet, Rfl: 1  Review of Systems: Denies appetite changes, fevers, chills, fatigue, unexplained weight changes. Denies hearing loss, neck lumps or masses, mouth sores, ringing in ears or voice changes. Denies cough or wheezing.  Denies shortness of breath. Denies chest pain or palpitations. Denies leg swelling. Denies abdominal distention, pain, blood in stools, constipation, diarrhea, nausea, vomiting, or early satiety. Denies hematuria or incontinence. +urinary retention Denies hot flashes, vaginal bleeding or vaginal discharge. +pelvic pain Denies joint pain, back pain or muscle pain/cramps. Denies itching, rash, or wounds. Denies dizziness, headaches, numbness or seizures. Denies swollen lymph nodes or glands, denies easy bruising or bleeding. Denies anxiety, depression, confusion, or decreased concentration.  Physical Exam: BP 135/77 (BP Location: Right Arm, Patient Position: Sitting)   Pulse 94   Temp 98.7 F (37.1 C) (Oral)   Resp 18   Wt 175 lb 6.4 oz (79.6 kg)   SpO2 96%   BMI 36.66 kg/m  General: Alert, oriented, no acute distress. HEENT: Normocephalic, atraumatic, sclera anicteric. Chest: Unlabored breathing on room air. Abdomen is markedly distended, firm, tender on palpation of lower abdomen. GU: Significant changes of the vulva secondary to prior surgeries and procedures. Picture available from previous visit. Very small 5 mm separation in vulvar agglutination with fleshy polyp like area, no distinct urethral opening. Topical 2% lidocaine applied to the vulva. Dr. Alvester Morin to the room to assess vulva. With patient consent, vulva assessed. Area cleansed with betadine and 1 cc of 2% lidocaine injected by Dr. Alvester Morin. #11 blade  scalpel used by Dr. Alvester Morin to extend 5 mm polyp like opening. Lacrimal size dilators inserted in opening to see if urethral could be identified. See addition from Dr. Alvester Morin. Unable to place 71 F foley, severe pain experienced with attempted insertion of a 10 F foley. Additional dilation performed by Dr. Alvester Morin. 8 F foley was able  to be placed with clear, yellow urine.   Update: After 45 minutes, 1250 cc of urine noted in foley bag. Patient reports slight decrease in abdominal pain/distension.   Laboratory & Radiologic Studies:  CBC    Component Value Date/Time   WBC 8.3 01/08/2023 1209   WBC 9.2 10/31/2022 0806   RBC 4.77 01/08/2023 1209   HGB 10.6 (L) 01/08/2023 1209   HGB 11.4 04/27/2022 1523   HCT 33.4 (L) 01/08/2023 1209   HCT 37.8 04/27/2022 1523   PLT 354 01/08/2023 1209   PLT 404 04/27/2022 1523   MCV 70.0 (L) 01/08/2023 1209   MCV 75 (L) 04/27/2022 1523   MCH 22.2 (L) 01/08/2023 1209   MCHC 31.7 01/08/2023 1209   RDW 16.2 (H) 01/08/2023 1209   RDW 15.5 (H) 04/27/2022 1523   LYMPHSABS 4.0 (H) 04/27/2022 1523   MONOABS 0.6 11/23/2021 1159   EOSABS 0.1 04/27/2022 1523   BASOSABS 0.0 04/27/2022 1523    BMET    Component Value Date/Time   NA 125 (L) 01/08/2023 1209   NA 141 11/30/2022 1509   K 3.8 01/08/2023 1209   CL 91 (L) 01/08/2023 1209   CO2 26 01/08/2023 1209   GLUCOSE 112 (H) 01/08/2023 1209   BUN 8 01/08/2023 1209   BUN 10 11/30/2022 1509   CREATININE 0.62 01/08/2023 1209   CREATININE 0.61 04/06/2016 1008   CALCIUM 9.7 01/08/2023 1209   EGFR 90 11/30/2022 1509   GFRNONAA >60 01/08/2023 1209   GFRNONAA >89 04/06/2016 1008    Assessment & Plan: Ellen Ryan is a 67 y.o. woman with history of vulvar dysplasia, recent biopsy showing high-grade vulvar dysplasia, with almost complete labial fusion and obliteration of her vagina status post vulvar resection as well as vaginal dilation and identification of urethra. Repeat adhesiolysis and urethral catheter  placement performed on 11/01/22. Foley removal on 12/22/2022 at office visit with Dr. Pricilla Ryan.  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 52F foley catheter able to be placed after multiple attempts of dilation. Plan for CT AP scan to evaluate urological system per Dr. Alvester Morin due to extended urinary retention, obstruction due to agglutination, severe abdominal pain. Labs discussed with patient including Na+ at 125.

## 2023-01-08 NOTE — Telephone Encounter (Signed)
Foley output 800

## 2023-01-08 NOTE — Patient Instructions (Addendum)
Today we placed a foley catheter in your bladder due to blockage of your urine.   We will obtain lab work today today and will notify you of the results.   Plan on having a CT to evaluate for any damage to your bladder or kidneys. Your CT scan is today at 4:30pm at [facility]. Nothing to eat or drink 4 hours before your scan so starting at 12:30pm.   Your sodium level is on the lower side. You will need to increase your intake of sodium over the next several days.

## 2023-01-08 NOTE — Telephone Encounter (Signed)
Foley output 1250

## 2023-01-08 NOTE — Telephone Encounter (Signed)
Foley output 425

## 2023-01-09 ENCOUNTER — Telehealth: Payer: Self-pay | Admitting: *Deleted

## 2023-01-09 LAB — URINE CULTURE: Culture: NO GROWTH

## 2023-01-09 NOTE — Telephone Encounter (Signed)
Spoke with [name] and she states she is feeling better and her foley catheter is draining well. She also states, "I'm taking ibuprofen and tylenol on occasion for pain".  Relayed message from Warner Mccreedy, NP that CT scan is showing changes to the bladder and kidney from not being able to urinate completely. We will need to keep the foley in for now and we will be getting an appt. For urogynecologist, Dr. [name] for further evaluation. And no evidence of infection on urine sample. Reminded patient of her follow up appt. With Warner Mccreedy on Friday September 27 th at 2 pm. Pt verbalized understanding and thanked the office for calling.

## 2023-01-09 NOTE — Telephone Encounter (Signed)
-----   Message from Doylene Bode sent at 01/09/2023  2:42 PM EDT ----- Please reach out and see how she is doing. Please let her know her CT scan is showing changes to the bladder and kidney from not being able to urinate completely. We will need to keep the foley in for now and we will be getting her back in to see the urogynecologist, Dr. [name] for further evaluation. Is the foley draining? No evidence of infection on urine sample. ----- Message ----- From: Interface, Rad Results In Sent: 01/08/2023   5:49 PM EDT To: Doylene Bode, NP

## 2023-01-10 NOTE — Telephone Encounter (Signed)
Thanks for the update. I reached out to Dr. [name] about next steps.

## 2023-01-19 ENCOUNTER — Inpatient Hospital Stay (HOSPITAL_BASED_OUTPATIENT_CLINIC_OR_DEPARTMENT_OTHER): Payer: Commercial Managed Care - HMO | Admitting: Gynecologic Oncology

## 2023-01-19 VITALS — BP 140/74 | HR 97 | Temp 98.5°F | Resp 18 | Wt 169.2 lb

## 2023-01-19 DIAGNOSIS — Z9079 Acquired absence of other genital organ(s): Secondary | ICD-10-CM | POA: Diagnosis not present

## 2023-01-19 DIAGNOSIS — R339 Retention of urine, unspecified: Secondary | ICD-10-CM

## 2023-01-19 DIAGNOSIS — Q525 Fusion of labia: Secondary | ICD-10-CM | POA: Diagnosis not present

## 2023-01-19 DIAGNOSIS — Z466 Encounter for fitting and adjustment of urinary device: Secondary | ICD-10-CM

## 2023-01-19 DIAGNOSIS — Z960 Presence of urogenital implants: Secondary | ICD-10-CM

## 2023-01-19 DIAGNOSIS — C519 Malignant neoplasm of vulva, unspecified: Secondary | ICD-10-CM

## 2023-01-19 NOTE — Patient Instructions (Signed)
Today we changed the foley catheter in your bladder to a bigger size catheter to help with leaking.  Continue emptying this. Keep the catheter bag lower than you when in the bed or sitting since this flows by gravity.   We will plan on seeing you in two weeks for a check up and to probably put in a larger catheter.   Please call for any needs, new symptoms.   We have reached out to Dr. [name] and will let you know her recommendations.

## 2023-01-19 NOTE — Progress Notes (Signed)
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  The patient is visiting her daughter now and lives in [place]. She reported a 2 year history of vulvar ulceration. She was seen by Dr Marice Potter for this problem in April, 2018 and was taken to the OR for an examination under anesthesia on 09/01/16. 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     On 09/21/16, she underwent a complete simple vulvectomy.  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 No invasive carcinoma, negative margins. Post-operative course was uneventful and she was discharged with a foley catheter for one week post-op.    She has urinary urgency not resolved by medications. She feels her urine deviates in stream. Since early April, 2019 she developed irritation of the vulva made better with silvadine cream.   Given that she did not tolerate an office examination she was taken to the operating room on Sep 13, 2017 for examination under anesthesia and vulvar biopsies.  Intraoperative findings were significant for subtle acetowhite changes immediately lateral on the right side of the urethral meatus.  There also acetowhite changes to the midline distal vagina.  Vagina was completely agglutinated.  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  The posterior vaginal introitus biopsy revealed benign normal squamous mucosa.   On 10/19/17 she underwent CO2 laser to the urethral meatus.   In May, 2020 she started experiencing a burning sensation at the skin of her introitus/vulva. She had been prescribed clobetasol but had been using silvadine instead of clobetasol. In 10/2018 was recommended to use nightly clobetasol.   Patient represented for care in 07/2021 with  sensation of "skin crawling". Had self treated for possible pinworms. Due to pain limiting exam, EUA and biopsies performed on 08/31/21. Bilateral biopsies were taken showing VIN3 with associated erosion and inflammation. No Paget's disease identified in these biopsies.    Underwent exam under anesthesia on 09/21/2021 as well as vulvar biopsies.  Findings notable for them with complete fusion of the superior labia with only a small, 1-2 cm opening at the posterior fourchette.  Given symptoms suspected to be related to urine being trapped under her fused labia, recommendation was made for suprapubic catheter which was placed in the PACU.  Unfortunately, this got dislodged several days after surgery.  1 vulvar biopsy showed mucosal inflammation and granulation tissue, no dysplasia.  Second vulvar biopsy showed VIN 3, no invasive carcinoma.   MRI pelvis 06/19/22: Unchanged postoperative appearance of the vulva, no evidence of recurrent or metastatic disease in the pelvis.  Multiple uterine fibroids.  Mild thickening of urinary bladder.   06/13/22: Complete simple vulvectomy, lysis of labial adhesions [name]); cystoscopy with bladder biopsy, vaginal graft placement, pudendal block [name])  Operative findings: Vulva notable for a 7 x 5 area of ulcerated appearing tissue, friable in places, covering agglutinated vagina. No discernable vagina with pinpoint hole towards inferior aspect of ulcerated and beefy red tissue where urine seen intermittently. After simple vulvectomy of the entire erythematous and ulcerated tissue, gentle blunt dissection ultimately led to identification of distal vagina and urethral opening. On cystoscopy, some hypertrophy of the bladder mucosa noted.     Redeveloped adhesions and due to increasing difficulty urinating and slow return of prior symptoms, surgery was recommended.  11/01/22: Lysis of labial adhesions, foley catheter placement. Operative findings: Vulva without lesions.  Bilateral vulva almost completely agglutinated along the midline with two small glandular appearing openings. Dilation of both and separation of the epithelium dividing the two openings from each other allowed for visualization of urethral mucosa. Clear urine noted after foley catheter placement.   On 12/22/22, she presented to the office with foley removal at that time. She was then seen on 01/08/2023 with urinary retention and moderate lower pelvic/abdominal pain. An 8 Fr foley catheter was placed ultimately after extending the opening on the vulva with a scalpel and using dilators in the urethra.   Interval History: Patient presents to the office today for follow-up.  She reports doing well since her last visit.  She states she had a short episode of leaking from the catheter at the insertion site on Wednesday morning that resolved.  This morning she started to have leakage again.  She has been tolerating her diet with no nausea or emesis.  States she is having normal bowel movements.  She has been emptying the catheter and has had adequate urine. No fever, chills. No concerns voiced.   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    Past Surgical History:  Procedure Laterality Date   CO2 LASER APPLICATION N/A 10/19/2017   Procedure: CO2 LASER APPLICATION OF THE VULVA;  Surgeon: Adolphus Birchwood, MD;  Location: [facility];  Service: Gynecology;   Laterality: N/A;   CO2 LASER APPLICATION N/A 09/21/2021   Procedure: VAGINOSCOPY, VULVAR BIOPSIES;  Surgeon: Carver Fila, MD;  Location: [facility];  Service: Gynecology;  Laterality: N/A;   CYSTOSCOPY  06/22/2022   Procedure: CYSTOSCOPY with BLADDER BIOPSY;  Surgeon: Marguerita Beards, MD;  Location: [facility];  Service: Gynecology;;   EUA/ VULVA BIOPSY  09-01-2016  dr dove  [facility]   GRAFT APPLICATION  06/22/2022   Procedure: GRAFT APPLICATION;  Surgeon: Marguerita Beards, MD;  Location[facility];  Service: Gynecology;;   LABIOPLASTY N/A 11/01/2022   Procedure: lysis of adhesions of labia / vulva;  Surgeon: Carver Fila, MD;  Location: [facility];  Service: Gynecology;  Laterality: N/A;   RADICAL VULVECTOMY N/A 09/21/2016   Procedure: RADICAL VULVECTOMY COMPLETE;  Surgeon: Adolphus Birchwood, MD;  Location: [facility];  Service: Gynecology;  Laterality: N/A;   TRANSTHORACIC ECHOCARDIOGRAM  01/16/2017   ef 60-65%,  grade 1 diastolic dysfunction/  very mild AV stenosis with moderately thickened and calcified leaflets (valve area 1.85cm^2)/  moderate LAE   VULVA /PERINEUM BIOPSY N/A 09/13/2017   Procedure: VULVAR BIOPSY;  Surgeon: Adolphus Birchwood, MD;  Location: [facility];  Service: Gynecology;  Laterality: N/A;   VULVA /PERINEUM BIOPSY N/A 08/31/2021   Procedure: VULVAR BIOPSY;  Surgeon: Tamela Oddi,  Misty Stanley, MD;  Location: [facility];  Service: Gynecology;  Laterality: N/A;   VULVECTOMY N/A 06/22/2022   Procedure: WIDE LOCAL EXCISION VULVECTOMY;  Surgeon: Carver Fila, MD;  Location: [facility];  Service: Gynecology;  Laterality: N/A;    Family History  Problem Relation Age of Onset   Stroke Mother    Arthritis Mother    Diabetes Father    Breast cancer Daughter        recurrent, and widely metastatic   Cancer Daughter     Social History   Socioeconomic History   Marital status:  Widowed    Spouse name: Not on file   Number of children: Not on file   Years of education: Not on file   Highest education level: Not on file  Occupational History   Not on file  Tobacco Use   Smoking status: Never   Smokeless tobacco: Never  Vaping Use   Vaping status: Never Used  Substance and Sexual Activity   Alcohol use: Not Currently   Drug use: No   Sexual activity: Not Currently    Birth control/protection: Post-menopausal  Other Topics Concern   Not on file  Social History Narrative   Lives in [place].   Visiting family in [place]   (Daughter--who passed away 2022/10/31)   Social Determinants of Health   Financial Resource Strain: Patient Declined (10/30/2022)   Overall Financial Resource Strain (CARDIA)    Difficulty of Paying Living Expenses: Patient declined  Food Insecurity: No Food Insecurity (11/30/2022)   Hunger Vital Sign    Worried About Running Out of Food in the Last Year: Never true    Ran Out of Food in the Last Year: Never true  Transportation Needs: No Transportation Needs (11/30/2022)   PRAPARE - Administrator, Civil Service (Medical): No    Lack of Transportation (Non-Medical): No  Physical Activity: Unknown (11/30/2022)   Exercise Vital Sign    Days of Exercise per Week: 3 days    Minutes of Exercise per Session: Not on file  Stress: Stress Concern Present (10/30/2022)   [employer] of Occupational Health - Occupational Stress Questionnaire    Feeling of Stress : To some extent  Social Connections: Moderately Isolated (11/30/2022)   Social Connection and Isolation Panel [NHANES]    Frequency of Communication with Friends and Family: Three times a week    Frequency of Social Gatherings with Friends and Family: Once a week    Attends Religious Services: More than 4 times per year    Active Member of [employer] or Organizations: No    Attends Banker Meetings: Never    Marital Status: Widowed    Current Medications:  Current  Outpatient Medications:    acetaminophen (TYLENOL) 500 MG tablet, Take 500 mg by mouth every 6 (six) hours as needed., Disp: , Rfl:    amLODipine (NORVASC) 10 MG tablet, Take 1 tablet (10 mg total) by mouth daily., Disp: 90 tablet, Rfl: 3   amoxicillin-clavulanate (AUGMENTIN) 875-125 MG tablet, Take 1 tablet by mouth 2 (two) times daily., Disp: 10 tablet, Rfl: 0   atorvastatin (LIPITOR) 40 MG tablet, Take 1 tablet (40 mg total) by mouth daily., Disp: 90 tablet, Rfl: 1   benzonatate (TESSALON) 200 MG capsule, Take 1 capsule (200 mg total) by mouth 2 (two) times daily as needed for cough., Disp: 20 capsule, Rfl: 0   Blood Glucose Monitoring Suppl DEVI, 1 each by Does not apply route  in the morning and at bedtime. May substitute to any manufacturer covered by patient's insurance., Disp: 1 each, Rfl: 0   estradiol (ESTRACE VAGINAL) 0.1 MG/GM vaginal cream, Apply a dime size amount of cream on your finger at bedtime and apply around the urinary opening on the vulva every night for the next month., Disp: 42.5 g, Rfl: 5   Glucose Blood (BLOOD GLUCOSE TEST STRIPS) STRP, 1 each by In Vitro route in the morning and at bedtime. May substitute to any manufacturer covered by patient's insurance., Disp: 200 strip, Rfl: 0   guaiFENesin (MUCINEX) 600 MG 12 hr tablet, Take 2 tablets (1,200 mg total) by mouth 2 (two) times daily as needed for cough or to loosen phlegm., Disp: 24 tablet, Rfl: 0   ibuprofen (ADVIL) 600 MG tablet, Take 600 mg by mouth every 6 (six) hours as needed., Disp: , Rfl:    Melatonin 10 MG CAPS, Take 10 mg by mouth at bedtime as needed (sleep)., Disp: , Rfl:    metFORMIN (GLUCOPHAGE) 500 MG tablet, TAKE 1 TABLET BY MOUTH TWICE A DAY WITH A MEAL, Disp: 180 tablet, Rfl: 1   Multiple Vitamin (MULTIVITAMIN WITH MINERALS) TABS tablet, Take 1 tablet by mouth daily. Centrum, Disp: , Rfl:    nitrofurantoin, macrocrystal-monohydrate, (MACROBID) 100 MG capsule, Take 1 capsule (100 mg total) by mouth 2 (two)  times daily. (Patient not taking: Reported on 12/29/2022), Disp: 14 capsule, Rfl: 0   Omega-3 Fatty Acids (FISH OIL) 1000 MG CPDR, Take 1 capsule by mouth daily., Disp: , Rfl:    polyethylene glycol (MIRALAX / GLYCOLAX) packet, Take 17 g by mouth daily as needed for mild constipation. (Patient not taking: Reported on 11/30/2022), Disp: , Rfl:    valsartan (DIOVAN) 40 MG tablet, Take 1 tablet (40 mg total) by mouth daily., Disp: 90 tablet, Rfl: 1  Review of Systems: Negative review of systems intake sheet: Denies appetite changes, fevers, chills, fatigue, unexplained weight changes. Denies hearing loss, neck lumps or masses, mouth sores, ringing in ears or voice changes. Denies cough or wheezing.  Denies shortness of breath. Denies chest pain or palpitations. Denies leg swelling. Denies abdominal distention, pain, blood in stools, constipation, diarrhea, nausea, vomiting, or early satiety. Denies hematuria or incontinence. +urinary retention Denies hot flashes, vaginal bleeding or vaginal discharge. +pelvic pain Denies joint pain, back pain or muscle pain/cramps. Denies itching, rash, or wounds. Denies dizziness, headaches, numbness or seizures. Denies swollen lymph nodes or glands, denies easy bruising or bleeding. Denies anxiety, depression, confusion, or decreased concentration.  Physical Exam: BP (!) 140/74   Pulse 97   Temp 98.5 F (36.9 C)   Resp 18   Wt 169 lb 3.2 oz (76.7 kg)   SpO2 98%   BMI 35.36 kg/m  General: Alert, oriented, no acute distress. HEENT: Normocephalic, atraumatic, sclera anicteric. Chest: Unlabored breathing on room air. Abdomen is obese, soft. GU: Significant changes of the vulva secondary to prior surgeries and procedures. The 8 French Foley catheter was removed.  Vulvar opening cleansed with Betadine.  Able to place a 12 [place] Foley without difficulty.  Patient tolerated this well.  Clear yellow urine noted after this. Dr. [name] in the room as well to  assess.  Laboratory & Radiologic Studies:  From last visit: CBC    Component Value Date/Time   WBC 8.3 01/08/2023 1209   WBC 9.2 10/31/2022 0806   RBC 4.77 01/08/2023 1209   HGB 10.6 (L) 01/08/2023 1209   HGB 11.4 04/27/2022 1523  HCT 33.4 (L) 01/08/2023 1209   HCT 37.8 04/27/2022 1523   PLT 354 01/08/2023 1209   PLT 404 04/27/2022 1523   MCV 70.0 (L) 01/08/2023 1209   MCV 75 (L) 04/27/2022 1523   MCH 22.2 (L) 01/08/2023 1209   MCHC 31.7 01/08/2023 1209   RDW 16.2 (H) 01/08/2023 1209   RDW 15.5 (H) 04/27/2022 1523   LYMPHSABS 4.0 (H) 04/27/2022 1523   MONOABS 0.6 11/23/2021 1159   EOSABS 0.1 04/27/2022 1523   BASOSABS 0.0 04/27/2022 1523    BMET    Component Value Date/Time   NA 125 (L) 01/08/2023 1209   NA 141 11/30/2022 1509   K 3.8 01/08/2023 1209   CL 91 (L) 01/08/2023 1209   CO2 26 01/08/2023 1209   GLUCOSE 112 (H) 01/08/2023 1209   BUN 8 01/08/2023 1209   BUN 10 11/30/2022 1509   CREATININE 0.62 01/08/2023 1209   CREATININE 0.61 04/06/2016 1008   CALCIUM 9.7 01/08/2023 1209   EGFR 90 11/30/2022 1509   GFRNONAA >60 01/08/2023 1209   GFRNONAA >89 04/06/2016 1008    Assessment & Plan: Ellen Ryan is a 67 y.o. woman with history of vulvar dysplasia, recent biopsy showing high-grade vulvar dysplasia, with almost complete labial fusion and obliteration of her vagina status post vulvar resection as well as vaginal dilation and identification of urethra. Repeat adhesiolysis and urethral catheter placement performed on 11/01/22. Foley removal on 12/22/2022 at office visit with Dr. [name].  Presenting to the office today for follow up, foley assessment/leakage. The foley catheter was changed to a 12 F without difficulty. Patient tolerated this well. Follow up appt has been made for two weeks. Patient is advised to call sooner for any needs, concerns, new symptoms, or issues with the foley. Our office will reach out to Dr. [name] as well for follow up  recommendations. Reportable signs and symptoms reviewed.

## 2023-01-23 NOTE — Progress Notes (Unsigned)
No chief complaint on file.  She saw SaraBeth for a respiratory illness on 9/6. She was prescribed Augmentin, as well as cough medication.  Diabetes: Last A1c was 6.9% on 10/30/22.  Her metformin dose had been increased to 2 BID (2000 mg daily) when fasting sugars were >200, and she tolerates this without side effects.  Sugars being high likely was also contributed by the UTI she had. Sugars have been running   Lab Results  Component Value Date   HGBA1C 6.9 (A) 10/30/2022      Hypertension, microalbuminuria:  Her amlodipine dose was cut to 1/2 tablet daily, as valsartan 80mg  was added due to the finding of microalbuminuria. She had developed palpitations, and she had cut the valsartan dose to 40 mg, and increased the amlodipine back to 10mg .  She had no further heart pounding after this change. BP in the office was fine, but she had higher BP's at home. She was encouraged to bring her BP monitor to assess the accuracy, and we had reviewed proper technique.  BP's are running     No headaches, dizziness, edema, palpitations, chest pain, shortness of breath.  BP Readings from Last 3 Encounters:  01/19/23 (!) 140/74  01/08/23 135/77  12/29/22 135/74   11/30/22 120/60  11/29/22 139/79  11/24/22 (!) 135/58   01/08/23 labs ordered by another provider showed hyponatremia.  Sodium had been normal on prior checks.   Chemistry      Component Value Date/Time   NA 125 (L) 01/08/2023 1209   NA 141 11/30/2022 1509   K 3.8 01/08/2023 1209   CL 91 (L) 01/08/2023 1209   CO2 26 01/08/2023 1209   BUN 8 01/08/2023 1209   BUN 10 11/30/2022 1509   CREATININE 0.62 01/08/2023 1209   CREATININE 0.61 04/06/2016 1008      Component Value Date/Time   CALCIUM 9.7 01/08/2023 1209   ALKPHOS 110 10/31/2022 0922   AST 22 10/31/2022 0922   ALT 24 10/31/2022 0922   BILITOT <0.2 10/31/2022 0922         PMH, PSH, SH reviewed   ROS: no fever, chills, URI symptoms, chest pain, shortness of breath,  edema. No headaches, dizziness. No polydipsia, hypoglycemia. No vaginal discharge or itch. Denies urinary symptoms,abdominal pain.   PHYSICAL EXAM:  There were no vitals taken for this visit.  Wt Readings from Last 3 Encounters:  01/19/23 169 lb 3.2 oz (76.7 kg)  01/08/23 175 lb 6.4 oz (79.6 kg)  12/29/22 168 lb (76.2 kg)   Pleasant, well-appearing female, in good spirits. HEENT: conjunctiva and sclera are clear, EOMI. Neck: no lymphadenopathy or mass Heart: regular rate and rhythm Lungs: clear bilaterally Abdomen: soft, nontender Back: no CVA tenderness Leg bag--urine appears clear, not cloudy, yellow. Extremities: no edema Neuro: alert and oriented, cranial nerves grossly intact. Psych: normal mood, affect, hygiene and grooming   ASSESSMENT/PLAN:   Did she bring her BP monitor to check accuracy?? I know she previously declined, but again offer flu and COVID vaccines  Ensure pt aware that A1c is slightly early. She can decline, vs potentially pay if not covered by insurance (not sure if it is a certain number of times/year, or if must be >90d).   Repeat urine microalbumin--elevated on last check, but she had +UTI. Now on ARB, and normal urine culture 2 weeks ago.

## 2023-01-24 ENCOUNTER — Encounter: Payer: Self-pay | Admitting: Family Medicine

## 2023-01-24 ENCOUNTER — Ambulatory Visit (INDEPENDENT_AMBULATORY_CARE_PROVIDER_SITE_OTHER): Payer: Managed Care, Other (non HMO) | Admitting: Family Medicine

## 2023-01-24 VITALS — BP 122/70 | HR 84 | Ht <= 58 in

## 2023-01-24 DIAGNOSIS — I10 Essential (primary) hypertension: Secondary | ICD-10-CM

## 2023-01-24 DIAGNOSIS — E1159 Type 2 diabetes mellitus with other circulatory complications: Secondary | ICD-10-CM | POA: Diagnosis not present

## 2023-01-24 DIAGNOSIS — Z23 Encounter for immunization: Secondary | ICD-10-CM

## 2023-01-24 DIAGNOSIS — R809 Proteinuria, unspecified: Secondary | ICD-10-CM

## 2023-01-24 DIAGNOSIS — E871 Hypo-osmolality and hyponatremia: Secondary | ICD-10-CM

## 2023-01-24 DIAGNOSIS — I152 Hypertension secondary to endocrine disorders: Secondary | ICD-10-CM

## 2023-01-24 DIAGNOSIS — E1165 Type 2 diabetes mellitus with hyperglycemia: Secondary | ICD-10-CM

## 2023-01-24 DIAGNOSIS — R059 Cough, unspecified: Secondary | ICD-10-CM

## 2023-01-24 MED ORDER — BLOOD GLUCOSE TEST VI STRP: 1.0000 | ORAL_STRIP | Freq: Two times a day (BID) | 3 refills | Status: AC

## 2023-01-24 NOTE — Patient Instructions (Addendum)
Bring your blood pressure monitor with you next week when you come to have your A1c checked.  I'd like to make sure that it is accurate, getting a reading close to the one the nurse gets.  You should be able to get your medication refills from [facility] next week. If you need to get more than 3 months worth, you likely will have to talk to your insurance company to get a waiver and be allowed to fill it for a larger amount (due to being [place] of the [place]). You have enough refills to get more than 90 days of the amlodipine. The other medications have a 90 day refill, but no additional refills. If you plan to try and get more than 90 days of the other medications, then the pharmacy will need to contact me for an additional refill.  Continue to drink plenty of water. I recommend taking an antihistamine such as claritin once daily. This may help with any nasal congestion, and dry up the mucus so that it doesn't run down the throat and cause the cough. You may continue to use Mucinex 12 hour tablets as needed for any thick mucus/phlegm or cough.  If the color changes to yellow/green, this could indicate an infection, and you should be re-evaluated.

## 2023-01-25 LAB — BASIC METABOLIC PANEL
BUN/Creatinine Ratio: 16 (ref 12–28)
BUN: 10 mg/dL (ref 8–27)
CO2: 24 mmol/L (ref 20–29)
Calcium: 9.9 mg/dL (ref 8.7–10.3)
Chloride: 99 mmol/L (ref 96–106)
Creatinine, Ser: 0.63 mg/dL (ref 0.57–1.00)
Glucose: 94 mg/dL (ref 70–99)
Potassium: 4.1 mmol/L (ref 3.5–5.2)
Sodium: 138 mmol/L (ref 134–144)
eGFR: 96 mL/min/{1.73_m2} (ref >59)

## 2023-01-29 ENCOUNTER — Other Ambulatory Visit: Payer: Self-pay | Admitting: Family Medicine

## 2023-01-29 DIAGNOSIS — E785 Hyperlipidemia, unspecified: Secondary | ICD-10-CM

## 2023-01-29 DIAGNOSIS — E1169 Type 2 diabetes mellitus with other specified complication: Secondary | ICD-10-CM

## 2023-01-29 NOTE — Telephone Encounter (Signed)
She is travelling. I called pharmacy and they are asking for quantity of #180 and want to put on a discount card for the patient so she can take 6 mo worth w/her. Are you ok with this?

## 2023-01-31 ENCOUNTER — Encounter: Payer: Commercial Managed Care - HMO | Admitting: Family Medicine

## 2023-02-01 ENCOUNTER — Telehealth: Payer: Self-pay | Admitting: *Deleted

## 2023-02-01 ENCOUNTER — Inpatient Hospital Stay: Payer: Medicare Other | Attending: Gynecologic Oncology | Admitting: Gynecologic Oncology

## 2023-02-01 VITALS — BP 139/76 | HR 88 | Temp 98.8°F | Resp 18 | Wt 169.0 lb

## 2023-02-01 DIAGNOSIS — Z960 Presence of urogenital implants: Secondary | ICD-10-CM

## 2023-02-01 DIAGNOSIS — R339 Retention of urine, unspecified: Secondary | ICD-10-CM | POA: Insufficient documentation

## 2023-02-01 DIAGNOSIS — Q525 Fusion of labia: Secondary | ICD-10-CM

## 2023-02-01 DIAGNOSIS — Z9079 Acquired absence of other genital organ(s): Secondary | ICD-10-CM | POA: Insufficient documentation

## 2023-02-01 DIAGNOSIS — C519 Malignant neoplasm of vulva, unspecified: Secondary | ICD-10-CM | POA: Diagnosis present

## 2023-02-01 NOTE — Patient Instructions (Addendum)
We reached out to Dr. [name], Berneta Levins. She is recommending evaluation with a urology. We are working on getting you in as soon as possible to meet with the Urologist. If the foley starts to leak more, we will need to change the tubing to a larger size.

## 2023-02-01 NOTE — Telephone Encounter (Signed)
Per Dr Tucker fax records and referral form to [facility] 

## 2023-02-02 NOTE — Progress Notes (Signed)
Gynecologic Oncology Return Clinic Visit  02/01/2023  Reason for Visit: Follow up, foley catheter assessment/possible exchange  Treatment History: Ellen Ryan is 67 year old female initially seen at the request of Dr Marice Potter for extramammary pagets disease.  The patient is visiting her daughter now and lives in [place]. She reported a 2 year history of vulvar ulceration. She was seen by Dr Marice Potter for this problem in April, 2018 and was taken to the OR for an examination under anesthesia on 09/01/16. 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     On 09/21/16, she underwent a complete simple vulvectomy.  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 No invasive carcinoma, negative margins. Post-operative course was uneventful and she was discharged with a foley catheter for one week post-op.    She has urinary urgency not resolved by medications. She feels her urine deviates in stream. Since early April, 2019 she developed irritation of the vulva made better with silvadine cream.   Given that she did not tolerate an office examination she was taken to the operating room on Sep 13, 2017 for examination under anesthesia and vulvar biopsies.  Intraoperative findings were significant for subtle acetowhite changes immediately lateral on the right side of the urethral meatus.  There also acetowhite changes to the midline distal vagina.  Vagina was completely agglutinated.  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  The posterior vaginal introitus biopsy revealed benign normal squamous mucosa.   On 10/19/17 she underwent CO2 laser to the urethral meatus.   In May, 2020 she started experiencing a burning sensation at the skin of her introitus/vulva. She had been prescribed clobetasol but had been using silvadine instead of clobetasol. In 10/2018 was recommended to use nightly clobetasol.   Patient represented for care in  07/2021 with sensation of "skin crawling". Had self treated for possible pinworms. Due to pain limiting exam, EUA and biopsies performed on 08/31/21. Bilateral biopsies were taken showing VIN3 with associated erosion and inflammation. No Paget's disease identified in these biopsies.    Underwent exam under anesthesia on 09/21/2021 as well as vulvar biopsies.  Findings notable for them with complete fusion of the superior labia with only a small, 1-2 cm opening at the posterior fourchette.  Given symptoms suspected to be related to urine being trapped under her fused labia, recommendation was made for suprapubic catheter which was placed in the PACU.  Unfortunately, this got dislodged several days after surgery.  1 vulvar biopsy showed mucosal inflammation and granulation tissue, no dysplasia.  Second vulvar biopsy showed VIN 3, no invasive carcinoma.   MRI pelvis 06/19/22: Unchanged postoperative appearance of the vulva, no evidence of recurrent or metastatic disease in the pelvis.  Multiple uterine fibroids.  Mild thickening of urinary bladder.   06/13/22: Complete simple vulvectomy, lysis of labial adhesions Ellen Ryan); cystoscopy with bladder biopsy, vaginal graft placement, pudendal block Ellen Ryan)  Operative findings: Vulva notable for a 7 x 5 area of ulcerated appearing tissue, friable in places, covering agglutinated vagina. No discernable vagina with pinpoint hole towards inferior aspect of ulcerated and beefy red tissue where urine seen intermittently. After simple vulvectomy of the entire erythematous and ulcerated tissue, gentle blunt dissection ultimately led to identification of distal vagina and urethral opening. On cystoscopy, some hypertrophy of the bladder mucosa noted.     Redeveloped adhesions and due to increasing difficulty urinating and slow return of prior symptoms, surgery was recommended.  11/01/22: Lysis of labial adhesions, foley catheter placement. Operative findings: Vulva without  lesions. Bilateral vulva almost completely agglutinated along the midline with two small glandular appearing openings. Dilation of both and separation of the epithelium dividing the two openings from each other allowed for visualization of urethral mucosa. Clear urine noted after foley catheter placement.   On 12/22/22, she presented to the office with foley removal at that time. She was then seen on 01/08/2023 with urinary retention and moderate lower pelvic/abdominal pain. An 8 Fr foley catheter was placed ultimately after extending the opening on the vulva with a scalpel and using dilators in the urethra.   At her last visit on 01/19/23, she underwent a foley catheter exchange with a 12 F placed.   Interval History: Patient presents to the office today for follow-up.  She reports doing well since her last visit.  She has had adequate output from the foley. She only reports mild leakage, more when bearing down to have a bowel movement. No fever, chills. Tolerating diet with no nausea or emesis. Asking when she can have the foley removed. She is leaving for a trip to [place] for 3 months on November 5 and she does not want to have the foley in place. Patient would like to avoid catheter change if possible since she is tolerating this size well without leakage.  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    Past Surgical History:  Procedure Laterality Date    CO2 LASER APPLICATION N/A 10/19/2017   Procedure: CO2 LASER APPLICATION OF THE VULVA;  Surgeon: Adolphus Birchwood, MD;  Location[facility];  Service: Gynecology;  Laterality: N/A;   CO2 LASER APPLICATION N/A 09/21/2021   Procedure: VAGINOSCOPY, VULVAR BIOPSIES;  Surgeon: Carver Fila, MD;  Location: [facility];  Service: Gynecology;  Laterality: N/A;   CYSTOSCOPY  06/22/2022   Procedure: CYSTOSCOPY with BLADDER BIOPSY;  Surgeon: Marguerita Beards, MD;  Location: [facility];  Service: Gynecology;;   EUA/ VULVA BIOPSY  09-01-2016  dr dove  [facility]   GRAFT APPLICATION  06/22/2022   Procedure: GRAFT APPLICATION;  Surgeon: Marguerita Beards, MD;  Location: [facility];  Service: Gynecology;;   LABIOPLASTY N/A 11/01/2022   Procedure: lysis of adhesions of labia / vulva;  Surgeon: Carver Fila, MD;  Location: [facility];  Service: Gynecology;  Laterality: N/A;   RADICAL VULVECTOMY N/A 09/21/2016   Procedure: RADICAL VULVECTOMY COMPLETE;  Surgeon: Adolphus Birchwood, MD;  Location: [facility];  Service: Gynecology;  Laterality: N/A;   TRANSTHORACIC ECHOCARDIOGRAM  01/16/2017   ef 60-65%,  grade 1 diastolic dysfunction/  very mild AV stenosis with moderately thickened and calcified leaflets (valve area 1.85cm^2)/  moderate LAE   VULVA /PERINEUM BIOPSY N/A 09/13/2017   Procedure: VULVAR  BIOPSY;  Surgeon: Adolphus Birchwood, MD;  Location: [facility];  Service: Gynecology;  Laterality: N/A;   VULVA Ples Specter BIOPSY N/A 08/31/2021   Procedure: VULVAR BIOPSY;  Surgeon: Antionette Char, MD;  Location: [facility];  Service: Gynecology;  Laterality: N/A;   VULVECTOMY N/A 06/22/2022   Procedure: WIDE LOCAL EXCISION VULVECTOMY;  Surgeon: Carver Fila, MD;  Location: [facility];  Service: Gynecology;  Laterality: N/A;    Family History  Problem Relation Age of Onset   Stroke Mother    Arthritis Mother     Diabetes Father    Breast cancer Daughter        recurrent, and widely metastatic   Cancer Daughter     Social History   Socioeconomic History   Marital status: Widowed    Spouse name: Not on file   Number of children: Not on file   Years of education: Not on file   Highest education level: Not on file  Occupational History   Not on file  Tobacco Use   Smoking status: Never   Smokeless tobacco: Never  Vaping Use   Vaping status: Never Used  Substance and Sexual Activity   Alcohol use: Not Currently   Drug use: No   Sexual activity: Not Currently    Birth control/protection: Post-menopausal  Other Topics Concern   Not on file  Social History Narrative   Lives in [place].   Visiting family in [place]   (Daughter--who passed away 11/10/22)   Social Determinants of Health   Financial Resource Strain: Patient Declined (10/30/2022)   Overall Financial Resource Strain (CARDIA)    Difficulty of Paying Living Expenses: Patient declined  Food Insecurity: No Food Insecurity (11/30/2022)   Hunger Vital Sign    Worried About Running Out of Food in the Last Year: Never true    Ran Out of Food in the Last Year: Never true  Transportation Needs: No Transportation Needs (11/30/2022)   PRAPARE - Administrator, Civil Service (Medical): No    Lack of Transportation (Non-Medical): No  Physical Activity: Unknown (11/30/2022)   Exercise Vital Sign    Days of Exercise per Week: 3 days    Minutes of Exercise per Session: Not on file  Stress: Stress Concern Present (10/30/2022)   Ellen Ryan [employer] - Occupational Stress Questionnaire    Feeling of Stress : To some extent  Social Connections: Moderately Isolated (11/30/2022)   Social Connection and Isolation Panel [NHANES]    Frequency of Communication with Friends and Family: Three times a week    Frequency of Social Gatherings with Friends and Family: Once a week    Attends Religious Services: More than 4 times per year     Active Member of [employer] or Organizations: No    Attends Banker Meetings: Never    Marital Status: Widowed    Current Medications:  Current Outpatient Medications:    acetaminophen (TYLENOL) 500 MG tablet, Take 500 mg by mouth every 6 (six) hours as needed. (Patient not taking: Reported on 01/24/2023), Disp: , Rfl:    amLODipine (NORVASC) 10 MG tablet, Take 1 tablet (10 mg total) by mouth daily., Disp: 90 tablet, Rfl: 3   atorvastatin (LIPITOR) 40 MG tablet, TAKE 1 TABLET BY MOUTH DAILY, Disp: 180 tablet, Rfl: 0   benzonatate (TESSALON) 200 MG capsule, Take 1 capsule (200 mg total) by mouth 2 (two) times daily as needed for cough. (Patient not taking: Reported  on 01/24/2023), Disp: 20 capsule, Rfl: 0   Blood Glucose Monitoring Suppl DEVI, 1 each by Does not apply route in the morning and at bedtime. May substitute to any manufacturer covered by patient's insurance., Disp: 1 each, Rfl: 0   estradiol (ESTRACE VAGINAL) 0.1 MG/GM vaginal cream, Apply a dime size amount of cream on your finger at bedtime and apply around the urinary opening on the vulva every night for the next month. (Patient not taking: Reported on 01/24/2023), Disp: 42.5 g, Rfl: 5   Glucose Blood (BLOOD GLUCOSE TEST STRIPS) STRP, 1 each by In Vitro route in the morning and at bedtime. One Touch Verio test strips, Disp: 200 strip, Rfl: 3   ibuprofen (ADVIL) 600 MG tablet, Take 600 mg by mouth every 6 (six) hours as needed. (Patient not taking: Reported on 01/24/2023), Disp: , Rfl:    Melatonin 10 MG CAPS, Take 10 mg by mouth at bedtime as needed (sleep). (Patient not taking: Reported on 01/24/2023), Disp: , Rfl:    metFORMIN (GLUCOPHAGE) 500 MG tablet, TAKE 1 TABLET BY MOUTH TWICE A DAY WITH A MEAL, Disp: 180 tablet, Rfl: 1   Multiple Vitamin (MULTIVITAMIN WITH MINERALS) TABS tablet, Take 1 tablet by mouth daily. Centrum, Disp: , Rfl:    Omega-3 Fatty Acids (FISH OIL) 1000 MG CPDR, Take 1 capsule by mouth daily., Disp: ,  Rfl:    polyethylene glycol (MIRALAX / GLYCOLAX) packet, Take 17 g by mouth daily as needed for mild constipation., Disp: , Rfl:    valsartan (DIOVAN) 40 MG tablet, Take 1 tablet (40 mg total) by mouth daily., Disp: 90 tablet, Rfl: 1  Review of Systems: Negative review of systems intake sheet: Denies appetite changes, fevers, chills, fatigue, unexplained weight changes. Denies hearing loss, neck lumps or masses, mouth sores, ringing in ears or voice changes. Denies cough or wheezing.  Denies shortness of breath. Denies chest pain or palpitations. Denies leg swelling. Denies abdominal distention, pain, blood in stools, constipation, diarrhea, nausea, vomiting, or early satiety. Denies hematuria or incontinence.  Denies hot flashes, vaginal bleeding or vaginal discharge.  Denies joint pain, back pain or muscle pain/cramps. Denies itching, rash, or wounds. Denies dizziness, headaches, numbness or seizures. Denies swollen lymph nodes or glands, denies easy bruising or bleeding. Denies anxiety, depression, confusion, or decreased concentration.  Physical Exam: BP 139/76 (BP Location: Right Arm, Patient Position: Sitting)   Pulse 88   Temp 98.8 F (37.1 C) (Oral)   Resp 18   Wt 169 lb (76.7 kg)   SpO2 98%   BMI 35.32 kg/m  General: Alert, oriented, no acute distress. HEENT: Normocephalic, atraumatic, sclera anicteric. Chest: Unlabored breathing on room air. Abdomen is obese, soft. GU: Significant changes of the vulva secondary to prior surgeries and procedures. 12 F foley in place with clear yellow urine noted in the bag. No erythematous lesions, blood, or drainage noted from the vulva. Foley bag changed to leg bag per pt request. No lower extrem edema noted bilaterally.    Laboratory & Radiologic Studies:  From last visit: CBC    Component Value Date/Time   WBC 8.3 01/08/2023 1209   WBC 9.2 10/31/2022 0806   RBC 4.77 01/08/2023 1209   HGB 10.6 (L) 01/08/2023 1209   HGB 11.4  04/27/2022 1523   HCT 33.4 (L) 01/08/2023 1209   HCT 37.8 04/27/2022 1523   PLT 354 01/08/2023 1209   PLT 404 04/27/2022 1523   MCV 70.0 (L) 01/08/2023 1209   MCV 75 (L)  04/27/2022 1523   MCH 22.2 (L) 01/08/2023 1209   MCHC 31.7 01/08/2023 1209   RDW 16.2 (H) 01/08/2023 1209   RDW 15.5 (H) 04/27/2022 1523   LYMPHSABS 4.0 (H) 04/27/2022 1523   MONOABS 0.6 11/23/2021 1159   EOSABS 0.1 04/27/2022 1523   BASOSABS 0.0 04/27/2022 1523    BMET    Component Value Date/Time   NA 138 01/24/2023 1250   K 4.1 01/24/2023 1250   CL 99 01/24/2023 1250   CO2 24 01/24/2023 1250   GLUCOSE 94 01/24/2023 1250   GLUCOSE 112 (H) 01/08/2023 1209   BUN 10 01/24/2023 1250   CREATININE 0.63 01/24/2023 1250   CREATININE 0.61 04/06/2016 1008   CALCIUM 9.9 01/24/2023 1250   EGFR 96 01/24/2023 1250   GFRNONAA >60 01/08/2023 1209   GFRNONAA >89 04/06/2016 1008    Assessment & Plan: Zriyah Righter is a 67 y.o. woman with history of vulvar dysplasia, recent biopsy showing high-grade vulvar dysplasia, with almost complete labial fusion and obliteration of her vagina status post vulvar resection as well as vaginal dilation and identification of urethra. Repeat adhesiolysis and urethral catheter placement performed on 11/01/22. Foley removal on 12/22/2022 at office visit with Dr. Pricilla Ryan. Replaced due to urinary retention and closure of vulvar tissue over urethra on 01/08/2023. An 8 F foley was replaced at this time. At her visit on 01/19/23, her foley was upsized to a 12 F.  Patient is not wanting to go on her upcoming trip with the catheter in place. This is distressing to her and she states she has no one to help care for this. Dr. Pricilla Ryan discussed this with the patient. After discussion with Dr. Florian Ryan and Dr. Pricilla Ryan about the patient's situation, the recommendation is for urology consultation. We will try to expedite this given upcoming trip.   She is currently doing well and would like to avoid upsizing the  catheter. Follow up appt has been made for the beginning of November before her trip. Patient is advised to call sooner for any needs, concerns, new symptoms, or issues with the foley. Reportable signs and symptoms reviewed.

## 2023-02-05 NOTE — Telephone Encounter (Signed)
Pt's son in law aware [facility] has been trying to get in touch with [facility] regarding an appointment. I gave him their number, he will pass it onto pt and will call to schedule.

## 2023-02-07 ENCOUNTER — Other Ambulatory Visit: Payer: Medicaid Other

## 2023-02-07 ENCOUNTER — Telehealth: Payer: Self-pay | Admitting: *Deleted

## 2023-02-07 DIAGNOSIS — E1165 Type 2 diabetes mellitus with hyperglycemia: Secondary | ICD-10-CM

## 2023-02-07 LAB — HEMOGLOBIN A1C
Est. average glucose Bld gHb Est-mCnc: 137 mg/dL
Hgb A1c MFr Bld: 6.4 % — ABNORMAL HIGH (ref 4.8–5.6)

## 2023-02-07 NOTE — Telephone Encounter (Signed)
Patient was here for A1C and bp monitor check.  I took her bp nad it was 130/80, machine 131/78.

## 2023-02-13 ENCOUNTER — Other Ambulatory Visit: Payer: Self-pay | Admitting: Family Medicine

## 2023-02-13 DIAGNOSIS — E1169 Type 2 diabetes mellitus with other specified complication: Secondary | ICD-10-CM

## 2023-02-19 ENCOUNTER — Other Ambulatory Visit: Payer: Self-pay | Admitting: Family Medicine

## 2023-02-19 DIAGNOSIS — I10 Essential (primary) hypertension: Secondary | ICD-10-CM

## 2023-02-20 ENCOUNTER — Encounter: Payer: Self-pay | Admitting: Gynecologic Oncology

## 2023-02-23 ENCOUNTER — Inpatient Hospital Stay: Payer: Medicare Other | Attending: Gynecologic Oncology | Admitting: Gynecologic Oncology

## 2023-02-23 ENCOUNTER — Telehealth: Payer: Self-pay | Admitting: *Deleted

## 2023-02-23 VITALS — BP 126/71 | HR 100 | Temp 98.4°F | Resp 19 | Wt 168.4 lb

## 2023-02-23 DIAGNOSIS — Z9079 Acquired absence of other genital organ(s): Secondary | ICD-10-CM | POA: Diagnosis not present

## 2023-02-23 DIAGNOSIS — Z8544 Personal history of malignant neoplasm of other female genital organs: Secondary | ICD-10-CM | POA: Diagnosis not present

## 2023-02-23 DIAGNOSIS — Q525 Fusion of labia: Secondary | ICD-10-CM | POA: Diagnosis not present

## 2023-02-23 DIAGNOSIS — Z960 Presence of urogenital implants: Secondary | ICD-10-CM | POA: Diagnosis not present

## 2023-02-23 DIAGNOSIS — T839XXA Unspecified complication of genitourinary prosthetic device, implant and graft, initial encounter: Secondary | ICD-10-CM | POA: Insufficient documentation

## 2023-02-23 DIAGNOSIS — N3289 Other specified disorders of bladder: Secondary | ICD-10-CM | POA: Diagnosis not present

## 2023-02-23 NOTE — Telephone Encounter (Signed)
Spoke with Ellen Ryan and her son in law Ellen Ryan who called the office stating that Ellen Ryan's catheter is leaking. Pt states it has leaked twice and more than the other catheter was.  Pt denies any pain and spasms. States the leg bag is draining well.   Pt's message relayed to Warner Mccreedy, NP that this could be trauma from the insertion and exchanging to a bigger size catheter.  Advised patient that she needs to make sure that the catheter is lined up and not kinking, this could cause bladder overflow and it won't drain properly. Try to let the foley hang to gravity and monitor over the weekend, if it's still doing it on Monday we can make appt. In the office to troubleshoot.  Pt and son in law verbalized understanding and after hours number given if any problems to call and ask for the MD on call for gyn/onc.

## 2023-02-23 NOTE — Progress Notes (Unsigned)
Patient reports doing well. Leaves for 2-3 mth trip to [place] next week. Has had some leaking around the cath. Was supposed to be prescribed abx but has not been sent to pharm. No fever or chills. Having BMs. No bleeding

## 2023-02-23 NOTE — Patient Instructions (Signed)
Today we put in a larger catheter. Hopefully this will help prevent leaking.   We will call Dr. Thomos Lemons office to find out about the antibiotic and we will make sure you have this before you leave.  Please call us when you get back in the country so we can arrange an appointment.

## 2023-02-26 ENCOUNTER — Inpatient Hospital Stay (HOSPITAL_BASED_OUTPATIENT_CLINIC_OR_DEPARTMENT_OTHER): Payer: Medicare Other | Admitting: Gynecologic Oncology

## 2023-02-26 VITALS — BP 133/73 | HR 74 | Temp 99.1°F | Resp 20 | Wt 167.6 lb

## 2023-02-26 DIAGNOSIS — Z960 Presence of urogenital implants: Secondary | ICD-10-CM | POA: Diagnosis not present

## 2023-02-26 DIAGNOSIS — T839XXA Unspecified complication of genitourinary prosthetic device, implant and graft, initial encounter: Secondary | ICD-10-CM | POA: Diagnosis not present

## 2023-02-26 DIAGNOSIS — Q525 Fusion of labia: Secondary | ICD-10-CM | POA: Diagnosis not present

## 2023-02-26 DIAGNOSIS — N3289 Other specified disorders of bladder: Secondary | ICD-10-CM | POA: Diagnosis not present

## 2023-02-26 MED ORDER — PHENAZOPYRIDINE HCL 95 MG PO TABS
95.0000 mg | ORAL_TABLET | Freq: Two times a day (BID) | ORAL | 0 refills | Status: DC | PRN
Start: 2023-02-26 — End: 2023-08-27

## 2023-02-26 NOTE — Patient Instructions (Addendum)
Today we removed the catheter and replaced another that is the same size.   We have sent in pyridium that you can use as needed for bladder irritation from the catheter. This may turn your urine orange.  If you develop leaking, please make sure to unhook the bag from the leg straps and hang this lower than you to gravity to see if this is blocked or kinked off.   You can take the antibiotic from Dr. Arita Miss daily while you are gone on your trip.   Please call our office and her office when you return to make follow up appointments.   If you have issues with the catheter while you are away, seek medical attention for further workup.

## 2023-02-26 NOTE — Telephone Encounter (Signed)
Spoke with patient and son-in-law and patient states her catheter has continued to leak through out the weekend after troubleshooting with straightening the catheter and lowering the leg bag.   Pt denies pain and or spasms and states she is having to wear a pad for the leaking. [facility] was notified and they have no available appointments with any of the providers for today. Pt is leaving for [place] tomorrow for three months. Message relayed to Warner Mccreedy, NP. Pt scheduled for appointment today at 1 pm with arrival time of 1245 for check in. Pt agreed to appointment time.

## 2023-02-28 NOTE — Progress Notes (Signed)
Gynecologic Oncology Return Clinic Visit  02/26/2023  Reason for Visit: Leaking around foley catheter  Treatment History: Ellen Ryan is 67 year old female initially seen at the request of Dr Marice Potter for extramammary pagets disease.  The patient is visiting her daughter now and lives in [place]. She reported a 2 year history of vulvar ulceration. She was seen by Dr Marice Potter for this problem in April, 2018 and was taken to the OR for an examination under anesthesia on 09/01/16. 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     On 09/21/16, she underwent a complete simple vulvectomy.  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 No invasive carcinoma, negative margins. Post-operative course was uneventful and she was discharged with a foley catheter for one week post-op.    She has urinary urgency not resolved by medications. She feels her urine deviates in stream. Since early April, 2019 she developed irritation of the vulva made better with silvadine cream.   Given that she did not tolerate an office examination she was taken to the operating room on Sep 13, 2017 for examination under anesthesia and vulvar biopsies.  Intraoperative findings were significant for subtle acetowhite changes immediately lateral on the right side of the urethral meatus.  There also acetowhite changes to the midline distal vagina.  Vagina was completely agglutinated.  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  The posterior vaginal introitus biopsy revealed benign normal squamous mucosa.   On 10/19/17 she underwent CO2 laser to the urethral meatus.   In May, 2020 she started experiencing a burning sensation at the skin of her introitus/vulva. She had been prescribed clobetasol but had been using silvadine instead of clobetasol. In 10/2018 was recommended to use nightly clobetasol.   Patient represented for care in 07/2021 with sensation of "skin  crawling". Had self treated for possible pinworms. Due to pain limiting exam, EUA and biopsies performed on 08/31/21. Bilateral biopsies were taken showing VIN3 with associated erosion and inflammation. No Paget's disease identified in these biopsies.    Underwent exam under anesthesia on 09/21/2021 as well as vulvar biopsies.  Findings notable for them with complete fusion of the superior labia with only a small, 1-2 cm opening at the posterior fourchette.  Given symptoms suspected to be related to urine being trapped under her fused labia, recommendation was made for suprapubic catheter which was placed in the PACU.  Unfortunately, this got dislodged several days after surgery.  1 vulvar biopsy showed mucosal inflammation and granulation tissue, no dysplasia.  Second vulvar biopsy showed VIN 3, no invasive carcinoma.   MRI pelvis 06/19/22: Unchanged postoperative appearance of the vulva, no evidence of recurrent or metastatic disease in the pelvis.  Multiple uterine fibroids.  Mild thickening of urinary bladder.   06/13/22: Complete simple vulvectomy, lysis of labial adhesions Ellen Ryan); cystoscopy with bladder biopsy, vaginal graft placement, pudendal block Ellen Ryan)  Operative findings: Vulva notable for a 7 x 5 area of ulcerated appearing tissue, friable in places, covering agglutinated vagina. No discernable vagina with pinpoint hole towards inferior aspect of ulcerated and beefy red tissue where urine seen intermittently. After simple vulvectomy of the entire erythematous and ulcerated tissue, gentle blunt dissection ultimately led to identification of distal vagina and urethral opening. On cystoscopy, some hypertrophy of the bladder mucosa noted.     Redeveloped adhesions and due to increasing difficulty urinating and slow return of prior symptoms, surgery was recommended.   11/01/22:  Lysis of labial adhesions, foley catheter placement. Operative findings: Vulva without lesions. Bilateral vulva  almost completely agglutinated along the midline with two small glandular appearing openings. Dilation of both and separation of the epithelium dividing the two openings from each other allowed for visualization of urethral mucosa. Clear urine noted after foley catheter placement.   On 12/22/22, she presented to the office with foley removal at that time. She was then seen on 01/08/2023 with urinary retention and moderate lower pelvic/abdominal pain. An 8 Fr foley catheter was placed ultimately after extending the opening on the vulva with a scalpel and using dilators in the urethra.   At her visit on 01/19/23, she underwent a foley catheter exchange with a 12 F placed. She was then upsized to a 16 F foley at her appt on 02/23/23.   Interval History: Patient presents to the office today for evaluation of her foley catheter leaking. She started experiencing urinary leakage around the catheter shortly after placement on 02/23/23. She does not feel her bladder is spasming. She has had clear, yellow urine draining in the leg bag. She leaves for a 2-3 mth trip to [place] tomorrow. She picked up the prescribed prophylactic antibiotics from Dr. Arita Miss. No fever or chills. Having BMs. No bleeding. No other symptoms reported. Doing well overall since last week's visit.  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    Past Surgical History:  Procedure Laterality Date   CO2  LASER APPLICATION N/A 10/19/2017   Procedure: CO2 LASER APPLICATION OF THE VULVA;  Surgeon: Ellen Birchwood, MD;  Location: [facility];  Service: Gynecology;  Laterality: N/A;   CO2 LASER APPLICATION N/A 09/21/2021   Procedure: VAGINOSCOPY, VULVAR BIOPSIES;  Surgeon: Carver Fila, MD;  Location[facility];  Service: Gynecology;  Laterality: N/A;   CYSTOSCOPY  06/22/2022   Procedure: CYSTOSCOPY with BLADDER BIOPSY;  Surgeon: Marguerita Beards, MD;  Location: [facility];  Service: Gynecology;;   EUA/ VULVA BIOPSY  09-01-2016  dr dove  [facility]   GRAFT APPLICATION  06/22/2022   Procedure: GRAFT APPLICATION;  Surgeon: Marguerita Beards, MD;  Location: [facility];  Service: Gynecology;;   LABIOPLASTY N/A 11/01/2022   Procedure: lysis of adhesions of labia / vulva;  Surgeon: Carver Fila, MD;  Location: [facility];  Service: Gynecology;  Laterality: N/A;   RADICAL VULVECTOMY N/A 09/21/2016   Procedure: RADICAL VULVECTOMY COMPLETE;  Surgeon: Ellen Birchwood, MD;  Location: [facility];  Service: Gynecology;  Laterality: N/A;   TRANSTHORACIC ECHOCARDIOGRAM  01/16/2017   ef 60-65%,  grade 1 diastolic dysfunction/  very mild AV stenosis with moderately thickened and calcified leaflets (valve area 1.85cm^2)/  moderate LAE   VULVA /PERINEUM BIOPSY N/A 09/13/2017   Procedure: VULVAR BIOPSY;  Surgeon: Ellen Birchwood,  MD;  Location: [facility];  Service: Gynecology;  Laterality: N/A;   VULVA Ples Specter BIOPSY N/A 08/31/2021   Procedure: VULVAR BIOPSY;  Surgeon: Antionette Char, MD;  Location: [facility];  Service: Gynecology;  Laterality: N/A;   VULVECTOMY N/A 06/22/2022   Procedure: WIDE LOCAL EXCISION VULVECTOMY;  Surgeon: Carver Fila, MD;  Location: [facility];  Service: Gynecology;  Laterality: N/A;    Family History  Problem Relation Age of Onset   Stroke Mother    Arthritis Mother     Diabetes Father    Breast cancer Daughter        recurrent, and widely metastatic   Cancer Daughter     Social History   Socioeconomic History   Marital status: Widowed    Spouse name: Not on file   Number of children: Not on file   Years of education: Not on file   Highest education level: Not on file  Occupational History   Not on file  Tobacco Use   Smoking status: Never   Smokeless tobacco: Never  Vaping Use   Vaping status: Never Used  Substance and Sexual Activity   Alcohol use: Not Currently   Drug use: No   Sexual activity: Not Currently    Birth control/protection: Post-menopausal  Other Topics Concern   Not on file  Social History Narrative   Lives in [place].   Visiting family in [place]   (Daughter--who passed away 2022-10-26)   Social Determinants of Health   Financial Resource Strain: Patient Declined (10/30/2022)   Overall Financial Resource Strain (CARDIA)    Difficulty of Paying Living Expenses: Patient declined  Food Insecurity: No Food Insecurity (11/30/2022)   Hunger Vital Sign    Worried About Running Out of Food in the Last Year: Never true    Ran Out of Food in the Last Year: Never true  Transportation Needs: No Transportation Needs (11/30/2022)   PRAPARE - Administrator, Civil Service (Medical): No    Lack of Transportation (Non-Medical): No  Physical Activity: Unknown (11/30/2022)   Exercise Vital Sign    Days of Exercise per Week: 3 days    Minutes of Exercise per Session: Not on file  Stress: Stress Concern Present (10/30/2022)   [employer] of Occupational Health - Occupational Stress Questionnaire    Feeling of Stress : To some extent  Social Connections: Moderately Isolated (11/30/2022)   Social Connection and Isolation Panel [NHANES]    Frequency of Communication with Friends and Family: Three times a week    Frequency of Social Gatherings with Friends and Family: Once a week    Attends Religious Services: More than 4 times per year     Active Member of [employer] or Organizations: No    Attends Banker Meetings: Never    Marital Status: Widowed    Current Medications:  Current Outpatient Medications:    phenazopyridine (PYRIDIUM) 95 MG tablet, Take 1 tablet (95 mg total) by mouth 2 (two) times daily as needed for pain (bladder pain, irritation from catheter)., Disp: 20 tablet, Rfl: 0   acetaminophen (TYLENOL) 500 MG tablet, Take 500 mg by mouth every 6 (six) hours as needed., Disp: , Rfl:    amLODipine (NORVASC) 10 MG tablet, TAKE 1 TABLET BY MOUTH DAILY, Disp: 180 tablet, Rfl: 1   atorvastatin (LIPITOR) 40 MG tablet, TAKE 1 TABLET BY MOUTH DAILY, Disp: 180 tablet, Rfl: 0   Blood Glucose Monitoring Suppl DEVI, 1  each by Does not apply route in the morning and at bedtime. May substitute to any manufacturer covered by patient's insurance., Disp: 1 each, Rfl: 0   estradiol (ESTRACE VAGINAL) 0.1 MG/GM vaginal cream, Apply a dime size amount of cream on your finger at bedtime and apply around the urinary opening on the vulva every night for the next month. (Patient not taking: Reported on 01/24/2023), Disp: 42.5 g, Rfl: 5   Glucose Blood (BLOOD GLUCOSE TEST STRIPS) STRP, 1 each by In Vitro route in the morning and at bedtime. One Touch Verio test strips, Disp: 200 strip, Rfl: 3   ibuprofen (ADVIL) 600 MG tablet, Take 600 mg by mouth every 6 (six) hours as needed. (Patient not taking: Reported on 01/24/2023), Disp: , Rfl:    Melatonin 10 MG CAPS, Take 10 mg by mouth at bedtime as needed (sleep). (Patient not taking: Reported on 01/24/2023), Disp: , Rfl:    metFORMIN (GLUCOPHAGE) 500 MG tablet, TAKE 1 TABLET BY MOUTH TWICE A DAY WITH A MEAL, Disp: 360 tablet, Rfl: 0   Multiple Vitamin (MULTIVITAMIN WITH MINERALS) TABS tablet, Take 1 tablet by mouth daily. Centrum, Disp: , Rfl:    Omega-3 Fatty Acids (FISH OIL) 1000 MG CPDR, Take 1 capsule by mouth daily., Disp: , Rfl:    polyethylene glycol (MIRALAX / GLYCOLAX) packet, Take  17 g by mouth daily as needed for mild constipation., Disp: , Rfl:    valsartan (DIOVAN) 40 MG tablet, Take 1 tablet (40 mg total) by mouth daily., Disp: 90 tablet, Rfl: 1  Review of Systems: Negative review of systems intake sheet: Denies appetite changes, fevers, chills, fatigue, unexplained weight changes. Denies hearing loss, neck lumps or masses, mouth sores, ringing in ears or voice changes. Denies cough or wheezing.  Denies shortness of breath. Denies chest pain or palpitations. Denies leg swelling. Denies abdominal distention, pain, blood in stools, constipation, diarrhea, nausea, vomiting, or early satiety. Denies hematuria or incontinence.  Denies hot flashes, vaginal bleeding or vaginal discharge.  Denies joint pain, back pain or muscle pain/cramps. Denies itching, rash, or wounds. Denies dizziness, headaches, numbness or seizures. Denies swollen lymph nodes or glands, denies easy bruising or bleeding. Denies anxiety, depression, confusion, or decreased concentration.  Physical Exam: BP 133/73 (Patient Position: Sitting)   Pulse 74   Temp 99.1 F (37.3 C) (Oral)   Resp 20   Wt 167 lb 9.6 oz (76 kg)   SpO2 98%   BMI 35.03 kg/m  General: Alert, oriented, no acute distress. HEENT: Normocephalic, atraumatic, sclera anicteric. Chest: Unlabored breathing on room air. Abdomen is obese, soft. GU: With chaperone present, significant changes of the vulva secondary to prior surgeries and procedures. No obvious urinary leakage noted from around the foley even with patient bearing down slightly. Yellow urine noted in the leg bag. With patient consent, 45 F foley removed without difficulty. Attempt at placement of an 49 F was unsuccessful due to resistance met. New 16 F foley placed without difficulty with clear yellow urine noted in the bag after. Patient tolerated this well. Foley bag changed to leg bag per pt request. No leaking noted or reported after patient ambulated in the  hall. No lower extrem edema noted bilaterally.    Laboratory & Radiologic Studies: None recent  Assessment & Plan: Talli Armond is a 67 y.o. woman with history of vulvar dysplasia, recent biopsy showing high-grade vulvar dysplasia, with almost complete labial fusion and obliteration of her vagina status post vulvar resection as well as  vaginal dilation and identification of urethra. Repeat adhesiolysis and urethral catheter placement performed on 11/01/22. Foley removal on 12/22/2022 at office visit with Dr. Pricilla Ryan. Replaced due to urinary retention and closure of vulvar tissue over urethra on 01/08/2023. An 8 F foley was replaced at this time. At her visit on 01/19/23, her foley was upsized to a 12 F. At visit on 02/23/23, she was upsized to a 16 F foley but experienced urine leaking around the catheter over the weekend that followed.  Today, we exchanged the existing foley with a new 16 F foley with no leaking noted after increased mobility. She is advised to continue taking the prophylactic oral antibiotics (keflex) daily while in [place] for the next 2-3 months. She is advised to reach out to the office when she returns from her trip to schedule follow up. Pyridium is prescribed as needed. Foley care discussed.  Patient is advised to call sooner for any needs, concerns, new symptoms, or issues with the foley. Reportable signs and symptoms reviewed.

## 2023-03-02 ENCOUNTER — Ambulatory Visit: Payer: Medicare Other | Admitting: Gynecologic Oncology

## 2023-06-18 ENCOUNTER — Telehealth: Payer: Self-pay

## 2023-06-18 NOTE — Telephone Encounter (Signed)
 Ellen Ryan, called for Ellen Ryan. reports being back from [place]. Pt states she was told to call and schedule a follow up appointment. She also wanted Ellen Mccreedy NP to know while she was in [place], the catheter was removed  on 1/25 d/t complications. It was not reinserted. Reports peeing ok she just feels like something is "up in there".   Pt aware I will notify Ellen Mccreedy NP and call her back with advice on the catheter and also on scheduling a follow up appointment.

## 2023-06-19 NOTE — Telephone Encounter (Signed)
 When does Haniah need to follow up with our department?

## 2023-06-20 NOTE — Telephone Encounter (Signed)
 LVM for [place] to call office regarding scheduling a follow up appointment as recommended by Warner Mccreedy NP

## 2023-06-20 NOTE — Telephone Encounter (Signed)
 Ellen Ryan has an appointment with Dr.Pace on 3/7. She is scheduled with Dr.Jackson-Moore on 3/26 @ 3:30

## 2023-07-18 ENCOUNTER — Inpatient Hospital Stay: Payer: Medicare Other | Admitting: Obstetrics & Gynecology

## 2023-07-18 ENCOUNTER — Telehealth: Payer: Self-pay

## 2023-07-18 DIAGNOSIS — D071 Carcinoma in situ of vulva: Secondary | ICD-10-CM

## 2023-07-18 NOTE — Telephone Encounter (Signed)
 Mrs. Bobeck cancelled her appointment for today with Dr.Jackson-Moore. She states she will call back to reschedule.

## 2023-07-24 HISTORY — PX: SUPRAPUBIC CATHETER INSERTION: SUR719

## 2023-07-25 ENCOUNTER — Other Ambulatory Visit: Payer: Self-pay | Admitting: Family Medicine

## 2023-07-25 DIAGNOSIS — E1169 Type 2 diabetes mellitus with other specified complication: Secondary | ICD-10-CM

## 2023-07-26 ENCOUNTER — Other Ambulatory Visit: Payer: Self-pay | Admitting: *Deleted

## 2023-07-26 ENCOUNTER — Telehealth: Payer: Self-pay | Admitting: *Deleted

## 2023-07-26 DIAGNOSIS — E1169 Type 2 diabetes mellitus with other specified complication: Secondary | ICD-10-CM

## 2023-07-26 DIAGNOSIS — R809 Proteinuria, unspecified: Secondary | ICD-10-CM

## 2023-07-26 MED ORDER — METFORMIN HCL 500 MG PO TABS: ORAL_TABLET | ORAL | 0 refills | Status: DC

## 2023-07-26 MED ORDER — ATORVASTATIN CALCIUM 40 MG PO TABS: 40.0000 mg | ORAL_TABLET | Freq: Every day | ORAL | 0 refills | Status: DC

## 2023-07-26 MED ORDER — VALSARTAN 40 MG PO TABS: 40.0000 mg | ORAL_TABLET | Freq: Every day | ORAL | 0 refills | Status: DC

## 2023-07-26 NOTE — Telephone Encounter (Signed)
 She just had A1c done by other docs, and doing okay, so okay to refill meds x 3 mos. Last labs were in July. Needs a med check in July, if can get AWV done then also, can do at same visit.

## 2023-07-26 NOTE — Telephone Encounter (Signed)
 Meds sent and scheduled her for AWV 11/05/23.

## 2023-07-26 NOTE — Telephone Encounter (Signed)
 Patient called and need refills on atorvastatin, metformin and valsartan. Would you like me schedule her an AWV? And if so, next avail?

## 2023-08-11 ENCOUNTER — Telehealth: Payer: Self-pay | Admitting: Family Medicine

## 2023-08-11 NOTE — Telephone Encounter (Signed)
 She apparently had a surgical procedure through [facility] on 4/16.  I haven't seen her since October.  Any home health orders need to come from the urologist, not our office. Please advise.  Copied from CRM [id number]. Topic: Clinical - Home Health Verbal Orders >> Aug 10, 2023 12:57 PM Alpha Arts wrote: Caller/Agency: [facility] Callback Number: [phone] Service Requested: Physical Therapy Frequency: 1 week 9 Any new concerns about the patient? Yes 2 PRN 1st change would need to be done at urology

## 2023-08-13 NOTE — Telephone Encounter (Signed)
 Called and spoke with Pam at [facility] and let her know. She will call Urology.

## 2023-08-16 ENCOUNTER — Ambulatory Visit: Payer: Self-pay

## 2023-08-16 NOTE — Telephone Encounter (Signed)
 Spoke with patients sister and she will call urology. If they would like appt Monday she will call back.

## 2023-08-16 NOTE — Telephone Encounter (Signed)
 If she is acutely having high blood sugars, and had been well controlled on metformin  until now, it may be a sign of infection.  She just had surgery, with bladder issues. She may need to be checked by her urologist for an infection. I can see her Monday to f/u on diabetes

## 2023-08-16 NOTE — Telephone Encounter (Signed)
 Chief Complaint: High blood sugar  Symptoms: None Frequency: yesterday morning 311, this morning 220 Pertinent Negatives: Patient denies other symptoms Disposition: [] ED /[] Urgent Care (no appt availability in office) / [x] Appointment(In office/virtual)/ []  [facility]/ [] Home Care/ [] Refused Recommended Disposition /[] [place] [facility] [facility]/ []  Follow-up with PCP Additional Notes: Patient's sister Ellen Ryan called and conference patient on the line. Patient says her blood sugars have been running high yesterday and today with taking metformin  as prescribed, no other symptoms. She says she has a urine catheter in. Advised OV and the sister requested afternoon appointment around 3-4 pm, advised latest is at 1330 on 08/22/23. Advised the office is closed for lunch, so there is no one I can reach to see if there is an availability at the requested time. Advised I can schedule what's available and someone will call back once the scheduled is checked if there is an opening for that time frame, she verbalized understanding. CB number [phone] for patient's sister Ellen Ryan regarding changing appointment.   Copied from CRM [id number]. Topic: Clinical - Red Word Triage >> Aug 16, 2023  1:03 PM Ellen Ryan wrote: Red Word that prompted transfer to Nurse Triage: Patients sister Ellen Ryan called in due to patient Blood sugar high three hundred and something Reason for Disposition  [1] Blood glucose 240 - 300 mg/dL (14.7 - 82.9 mmol/L) AND [2] does not  use insulin  (e.g., not insulin -dependent; most people with type 2 diabetes)  Answer Assessment - Initial Assessment Questions 1. BLOOD GLUCOSE: "What is your blood glucose level?"      311 yesterday and 220 this morning 2. ONSET: "When did you check the blood glucose?"     Before eating each day 3. USUAL RANGE: "What is your glucose level usually?" (e.g., usual fasting morning value, usual evening value)     Below 150 4. DIABETES PILLS: "Do you take  any pills for your diabetes?" If Yes, ask: "Have you missed taking any pills recently?"     Metformin  BID, no missed doses 5. OTHER SYMPTOMS: "Do you have any symptoms?" (e.g., fever, frequent urination, difficulty breathing, dizziness, weakness, vomiting)     No  Protocols used: Diabetes - High Blood Sugar-A-AH

## 2023-08-21 ENCOUNTER — Other Ambulatory Visit: Payer: Self-pay

## 2023-08-21 ENCOUNTER — Emergency Department (HOSPITAL_COMMUNITY): Admission: EM | Admit: 2023-08-21 | Discharge: 2023-08-21 | Disposition: A | Discharge status: Home / Self Care

## 2023-08-21 ENCOUNTER — Encounter (HOSPITAL_COMMUNITY): Payer: Self-pay | Admitting: *Deleted

## 2023-08-21 DIAGNOSIS — R7309 Other abnormal glucose: Secondary | ICD-10-CM | POA: Diagnosis not present

## 2023-08-21 DIAGNOSIS — T83098A Other mechanical complication of other indwelling urethral catheter, initial encounter: Secondary | ICD-10-CM | POA: Diagnosis present

## 2023-08-21 DIAGNOSIS — Y69 Unspecified misadventure during surgical and medical care: Secondary | ICD-10-CM | POA: Diagnosis not present

## 2023-08-21 DIAGNOSIS — T83010A Breakdown (mechanical) of cystostomy catheter, initial encounter: Secondary | ICD-10-CM

## 2023-08-21 LAB — CBG MONITORING, ED: Glucose-Capillary: 342 mg/dL — ABNORMAL HIGH (ref 70–99)

## 2023-08-21 NOTE — ED Provider Notes (Cosign Needed Addendum)
 [place] EMERGENCY DEPARTMENT AT [facility] Provider Note   CSN: [id number] Arrival date & time: 08/21/23  1925     History  No chief complaint on file.   Ellen Ryan is a 68 y.o. female.  Pt had a suprapubic catheter placed on April 16.  Pt reports leaking around tube at site.  Pt reports area not draining into bag now.  Pt reports some discomfort in lower abdomen. Pt reports no urine draining into bag.  Patient was seen at a urologist with [facility].  Patient has a past medical history of Paget's disease.  Patient had to have a suprapubic catheter due to scarring.  Patient denies any fever or chills she has not had any bleeding from around the site.  Patient denies any nausea or vomiting she denies drainage around the incision.  The history is provided by the patient. No language interpreter was used.       Home Medications Prior to Admission medications   Medication Sig Start Date End Date Taking? Authorizing Provider  acetaminophen  (TYLENOL ) 500 MG tablet Take 500 mg by mouth every 6 (six) hours as needed.    [provider]  amLODipine  (NORVASC ) 10 MG tablet TAKE 1 TABLET BY MOUTH DAILY 02/20/23   Roosvelt Colla, MD  atorvastatin  (LIPITOR) 40 MG tablet Take 1 tablet (40 mg total) by mouth daily. 07/26/23   Roosvelt Colla, MD  Blood Glucose Monitoring Suppl DEVI 1 each by Does not apply route in the morning and at bedtime. May substitute to any manufacturer covered by patient's insurance. 11/16/22   Roosvelt Colla, MD  estradiol  (ESTRACE  VAGINAL) 0.1 MG/GM vaginal cream Apply a dime size amount of cream on your finger at bedtime and apply around the urinary opening on the vulva every night for the next month. Patient not taking: Reported on 01/24/2023 12/22/22   Cross, Melissa D, NP  ibuprofen  (ADVIL ) 600 MG tablet Take 600 mg by mouth every 6 (six) hours as needed. Patient not taking: Reported on 01/24/2023    [provider]  Melatonin 10 MG CAPS Take 10  mg by mouth at bedtime as needed (sleep). Patient not taking: Reported on 01/24/2023    [provider]  metFORMIN  (GLUCOPHAGE ) 500 MG tablet TAKE 1 TABLET BY MOUTH TWICE A DAY WITH A MEAL 07/26/23   Roosvelt Colla, MD  Multiple Vitamin (MULTIVITAMIN WITH MINERALS) TABS tablet Take 1 tablet by mouth daily. Centrum    [provider]  Omega-3 Fatty Acids (FISH OIL) 1000 MG CPDR Take 1 capsule by mouth daily.    [provider]  phenazopyridine  (PYRIDIUM ) 95 MG tablet Take 1 tablet (95 mg total) by mouth 2 (two) times daily as needed for pain (bladder pain, irritation from catheter). 02/26/23   Cross, Melissa D, NP  polyethylene glycol (MIRALAX / GLYCOLAX) packet Take 17 g by mouth daily as needed for mild constipation.    [provider]  valsartan  (DIOVAN ) 40 MG tablet Take 1 tablet (40 mg total) by mouth daily. 07/26/23   Roosvelt Colla, MD      Allergies    Gabapentin  and Hydrocodone     Review of Systems   Review of Systems  Skin:  Positive for wound.  All other systems reviewed and are negative.   Physical Exam Updated Vital Signs BP (!) 152/74 (BP Location: Right Arm)   Pulse 95   Temp 98.1 F (36.7 C)   Resp 16   SpO2 97%  Physical Exam Vitals  and nursing note reviewed.  Constitutional:      Appearance: She is well-developed.  HENT:     Head: Normocephalic.  Cardiovascular:     Rate and Rhythm: Normal rate.  Pulmonary:     Effort: Pulmonary effort is normal.  Abdominal:     General: There is no distension.     Comments: 52 French catheter in suprapubic urinary stoma.  Urine draining around site.  Musculoskeletal:        General: Normal range of motion.     Cervical back: Normal range of motion.  Skin:    General: Skin is warm.  Neurological:     General: No focal deficit present.     Mental Status: She is alert and oriented to person, place, and time.     ED Results / Procedures / Treatments   Labs (all labs ordered are listed, but only  abnormal results are displayed) Labs Reviewed  CBG MONITORING, ED - Abnormal; Notable for the following components:      Result Value   Glucose-Capillary 342 (*)    All other components within normal limits    EKG None  Radiology No results found.  Procedures Procedures    Medications Ordered in ED Medications - No data to display  ED Course/ Medical Decision Making/ A&P                                 Medical Decision Making Amount and/or Complexity of Data Reviewed Discussion of management or test interpretation with external provider(s): I discussed the patient with Dr. Dulcy Gibney with urology.  He advised doing a bladder scan and readjusting catheter.  He advised not to remove or replace as this is a new site.  He states this is most likely caused by bladder spasm.  He advised to have patient call her urologist to be seen tomorrow  Risk Risk Details: I was able to advance catheter approximately 1 cm and noted flow into the catheter.  Patient's leg bag was emptied and patient was observed.  Patient had approximately 50 cc of urine in bag after 20 minutes.  There is still a small amount of leakage at the site.  Bladder scan shows no urine in the bladder.  I counseled patient and her daughter about plans for follow-up.  Patient is discharged in stable condition           Final Clinical Impression(s) / ED Diagnoses Final diagnoses:  Suprapubic catheter dysfunction, initial encounter [facility])    Rx / DC Orders ED Discharge Orders     None         Evelyn Hire 08/21/23 2119    Evelyn Hire 08/21/23 2155    Carin Charleston, MD 08/22/23 [phone]

## 2023-08-21 NOTE — ED Triage Notes (Signed)
 Pt arrived with suprapubic catheter that is leaking around the site. C/o bladder pressure

## 2023-08-22 ENCOUNTER — Ambulatory Visit: Admitting: Family Medicine

## 2023-08-24 ENCOUNTER — Encounter: Payer: Self-pay | Admitting: Urology

## 2023-08-26 NOTE — Progress Notes (Unsigned)
 No chief complaint on file.   H/o extramammary Paget's disease, s/p vulvectomy in 2018. She saw Dr. Rose Conception on 07/16/2023 for scarring of the genitals and resultant urinary retention. He performed EUA and suprapubic catheter placement on 08/08/23. She went to the ER on 4/29 with leakage around the catheter, and no drainage from the catheter.  The tube was advanced and bladder drained. Bladder spasm was felt to be contributing. She followed up with [facility] urology on 5/1. She was started on Myrbetriq  50 mg daily for bladder spasms. She had reported constipation, and they recommended stool softener and Miralax. She and daughter were instructed on how to irrigate the catheter, and advised to irrigate the catheter if it stops draining.   Her sister had called the office on 4/24 stating that her blood sugars had been running high x 2d (220, 311). Diabetes has been well controlled on metformin  BID, with last A1c being 6.4% in October 2024 on this regimen.    PMH, PSH, SH reviewed   ROS:    PHYSICAL EXAM:  There were no vitals taken for this visit.  Wt Readings from Last 3 Encounters:  02/26/23 167 lb 9.6 oz (76 kg)  02/23/23 168 lb 6.4 oz (76.4 kg)  02/01/23 169 lb (76.7 kg)       ASSESSMENT/PLAN:   A1c  Will need to add new med myrbetriq  from urologist   She is scheduled for AWV in 10/2023

## 2023-08-27 ENCOUNTER — Encounter: Payer: Self-pay | Admitting: Family Medicine

## 2023-08-27 ENCOUNTER — Ambulatory Visit (INDEPENDENT_AMBULATORY_CARE_PROVIDER_SITE_OTHER): Admitting: Family Medicine

## 2023-08-27 VITALS — BP 124/78 | HR 64 | Ht <= 58 in | Wt 160.2 lb

## 2023-08-27 DIAGNOSIS — Z1211 Encounter for screening for malignant neoplasm of colon: Secondary | ICD-10-CM | POA: Diagnosis not present

## 2023-08-27 DIAGNOSIS — K5900 Constipation, unspecified: Secondary | ICD-10-CM

## 2023-08-27 DIAGNOSIS — E1165 Type 2 diabetes mellitus with hyperglycemia: Secondary | ICD-10-CM

## 2023-08-27 DIAGNOSIS — Z9359 Other cystostomy status: Secondary | ICD-10-CM | POA: Insufficient documentation

## 2023-08-27 DIAGNOSIS — I10 Essential (primary) hypertension: Secondary | ICD-10-CM

## 2023-08-27 LAB — POCT GLYCOSYLATED HEMOGLOBIN (HGB A1C): Hemoglobin A1C: 6.4 % — AB (ref 4.0–5.6)

## 2023-08-27 NOTE — Patient Instructions (Addendum)
 Let us  know which dulcolax you have been taking (the laxative, bisacoydl, or the stool softener, docusate)  Try and follow a high fiber diet. Stay well hydrated. Try and get 30 minutes of exercise each day (even if broken up into smaller bits, such as 10-15 minutes at a time).  Your sugars overall are fine--your A1c was 6.4% which shows that your diabetes is well controlled. Continue the current dose of metformin .  Look into grief counseling through hospice [facility]).

## 2023-09-25 ENCOUNTER — Encounter: Payer: Self-pay | Admitting: Gastroenterology

## 2023-10-28 ENCOUNTER — Other Ambulatory Visit: Payer: Self-pay | Admitting: Family Medicine

## 2023-10-28 DIAGNOSIS — E1169 Type 2 diabetes mellitus with other specified complication: Secondary | ICD-10-CM

## 2023-11-04 NOTE — Patient Instructions (Incomplete)
 HEALTH MAINTENANCE RECOMMENDATIONS:  It is recommended that you get at least 30 minutes of aerobic exercise at least 5 days/week (for weight loss, you may need as much as 60-90 minutes). This can be any activity that gets your heart rate up. This can be divided in 10-15 minute intervals if needed, but try and build up your endurance at least once a week.  Weight bearing exercise is also recommended twice weekly.  Eat a healthy diet with lots of vegetables, fruits and fiber.  Colorful foods have a lot of vitamins (ie green vegetables, tomatoes, red peppers, etc).  Limit sweet tea, regular sodas and alcoholic beverages, all of which has a lot of calories and sugar.  Up to 1 alcoholic drink daily may be beneficial for women (unless trying to lose weight, watch sugars).  Drink a lot of water .  Calcium  recommendations are 1200-1500 mg daily (1500 mg for postmenopausal women or women without ovaries), and vitamin D  1000 IU daily.  This should be obtained from diet and/or supplements (vitamins), and calcium  should not be taken all at once, but in divided doses.  Monthly self breast exams and yearly mammograms for women over the age of 45 is recommended.  Sunscreen of at least SPF 30 should be used on all sun-exposed parts of the skin when outside between the hours of 10 am and 4 pm (not just when at beach or pool, but even with exercise, golf, tennis, and yard work!)  Use a sunscreen that says broad spectrum so it covers both UVA and UVB rays, and make sure to reapply every 1-2 hours.  Remember to change the batteries in your smoke detectors when changing your clock times in the spring and fall. Carbon monoxide detectors are recommended for your home.  Use your seat belt every time you are in a car, and please drive safely and not be distracted with cell phones and texting while driving.   Ellen Ryan , Thank you for taking time to come for your Medicare Wellness Visit. I appreciate your ongoing  commitment to your health goals. Please review the following plan we discussed and let me know if I can assist you in the future.   This is a list of the screening recommended for you and due dates:  Health Maintenance  Topic Date Due   Medicare Annual Wellness Visit  Never done   Eye exam for diabetics  Never done   Colon Cancer Screening  Never done   Mammogram  12/20/2019   Pneumococcal Vaccine for age over 49 (2 of 2 - PCV) 09/30/2021   COVID-19 Vaccine (4 - 2024-25 season) 12/24/2022   Yearly kidney health urinalysis for diabetes  10/30/2023   Complete foot exam   10/30/2023   Flu Shot  11/23/2023   Yearly kidney function blood test for diabetes  01/24/2024   Hemoglobin A1C  02/27/2024   DTaP/Tdap/Td vaccine (2 - Td or Tdap) 11/24/2031   Hepatitis C Screening  Completed   Zoster (Shingles) Vaccine  Completed   Hepatitis B Vaccine  Aged Out   HPV Vaccine  Aged Out   Meningitis B Vaccine  Aged Out   DEXA scan (bone density measurement)  Discontinued    Please get diabetic eye exams once a year.  You are long past due for a mammogram, for screening for breast cancer. You are also due for a bone density test.  These tests have been ordered for you.  Let us  know if you don't hear from  anybody at [facility] (the facility on [address]) to schedule these imaging tests.  I recommend getting high dose flu shots and a COVID booster in the fall (they usually come out with an updated COVID vaccine in early September; wait for the new one to come out).  I recommend getting the RSV vaccine in the Fall. You need to get this from the pharmacy.  Please contact your gynecologist for routine follow-up and exams (gyn-onc clinic).

## 2023-11-04 NOTE — Progress Notes (Unsigned)
 No chief complaint on file.  Ellen Ryan is a 67 y.o. female who presents for annual wellness visit and follow-up on chronic medical conditions.    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 She is under the care of urology, Dr. Levonia. He performed EUA and suprapubic catheter placement on 08/08/23. Shortly after she started having some bladder spasms, and was started on Myrbetriq  50 mg daily in May 2025. Plan is for monthly changes of the suprapubic catheter until she has Urethroplasty, and possible buccal mucosa graft, scheduled in October.  Diabetes has been well controlled on metformin  BID, with last A1c being 6.4% in May 2025 on this regimen. Sugars are running ***  She checks feet regularly and denies concerns or lesions. Last eye exam was ***   Lab Results  Component Value Date   HGBA1C 6.4 (A) 08/27/2023   Hypertension, microalbuminuria:  She is taking amlodipine  10 mg and valsartan  40 mg daily (previously reported palpitations when taking 5 mg amlodipine  and 80 mg of valsartan ). BP's have been running ***  She denies headaches, dizziness, palpitations, edema, chest pain or shortness of breath.  BP Readings from Last 3 Encounters:  08/27/23 124/78  08/21/23 (!) 150/79  02/26/23 133/73   Mixed hyperlipidemia:  She is compliant with atorvastatin  40 mg daily. She denies side effects. She tries to follow a low cholesterol diet. She is due for recheck of lipids.  Lab Results  Component Value Date   CHOL 170 10/31/2022   HDL 47 10/31/2022   LDLCALC 90 10/31/2022   TRIG 193 (H) 10/31/2022   CHOLHDL 3.6 10/31/2022       Constipation-- She is using dulocolax every other day with improved control of her constipation. She denies abdominal cramping, diarrhea, blood or mucus in the stool. She previously tried Miralax, didn't find it effective. She was referred to GI at her  visit in May, as she has never had colonoscopy for colon cancer screening. She has appt scheduled 8/5 with [facility].   Immunization History  Administered Date(s) Administered   Fluad Trivalent(High Dose 65+) 01/24/2023   PFIZER(Purple Top)SARS-COV-2 Vaccination 06/05/2019, 06/30/2019, 06/27/2020   Pneumococcal Polysaccharide-23 09/30/2020   Tdap 11/23/2021   Zoster Recombinant(Shingrix ) 09/30/2020, 11/23/2021   Last Pap smear: *** Last mammogram: 11/2018 Last colonoscopy: never, scheduled to see GI 11/2023 Last DEXA: Dentist: Ophtho: Exercise:   Patient Care Team: Randol Dawes, MD as PCP - General (Family Medicine)  Urology: Dr. Levonia Gyn-Onc Dr. Rogelio, Dr. Viktoria Uro-gyn: Dr. Marilynne (no longer sees) Ophtho: Dentist:  Others?  Derm/ortho, etc   Depression Screening: Flowsheet Row Office Visit from 07/14/2022 in [facility]  PHQ-2 Total Score 1    Flowsheet Row Office Visit from 07/14/2022 in [facility]  PHQ-9 Total Score 1    Falls screen:     07/14/2022    2:46 PM 11/23/2021   11:18 AM 06/29/2021    9:57 AM 07/01/2018    3:53 PM  Fall Risk   Falls in the past year? 0 0 0 0   Number falls in past yr:  0 0   Injury with Fall?  0 0   Risk for fall due to :  No Fall Risks    Follow up  Falls evaluation completed        Data saved with a previous flowsheet row definition  Functional Status Survey:          End of Life Discussion:  Patient {ACTIONS; HAS/DOES NOT HAVE:19233} a living will and medical power of attorney   PMH, PSH, SH and FH were reviewed and updated   ROS:  Patient denies anorexia, fever, weight changes, headaches, vision changes, decreased hearing, URI symptoms, breast concerns, chest pain, palpitations, dizziness, syncope, dyspnea on exertion, cough, swelling, nausea, vomiting, diarrhea, abdominal pain, melena, hematochezia, indigestion/heartburn, hematuria,  incontinence, dysuria, vaginal bleeding, discharge, odor or itch, genital lesions, joint pains, numbness, tingling, weakness, tremor, suspicious skin lesions, depression, anxiety, abnormal bleeding/bruising, or enlarged lymph nodes.  Constipation Bladder spasms Suprapubic catheter    PHYSICAL EXAM:  There were no vitals taken for this visit.  Wt Readings from Last 3 Encounters:  08/27/23 160 lb 3.2 oz (72.7 kg)  02/26/23 167 lb 9.6 oz (76 kg)  02/23/23 168 lb 6.4 oz (76.4 kg)    General Appearance:    Alert, cooperative, no distress, appears stated age  Head:    Normocephalic, without obvious abnormality, atraumatic  Eyes:    PERRL, conjunctiva/corneas clear, EOM's intact, fundi    benign  Ears:    Normal TM's and external ear canals  Nose:   Nares normal, mucosa normal, no drainage or sinus   tenderness  Throat:   Lips, mucosa, and tongue normal; teeth and gums normal  Neck:   Supple, no lymphadenopathy;  thyroid:  no   enlargement/tenderness/nodules; no carotid   bruit or JVD  Back:    Spine nontender, no curvature, ROM normal, no CVA     tenderness  Lungs:     Clear to auscultation bilaterally without wheezes, rales or     ronchi; respirations unlabored  Chest Wall:    No tenderness or deformity   Heart:    Regular rate and rhythm, S1 and S2 normal, no murmur, rub   or gallop  Breast Exam:    No tenderness, masses, or nipple discharge or inversion.      No axillary lymphadenopathy  Abdomen:     Soft, non-tender, nondistended, normoactive bowel sounds,    no masses, no hepatosplenomegaly  Genitalia:    Deferred to specialists  Rectal:    Not performed  Extremities:   No clubbing, cyanosis or edema  Pulses:   2+ and symmetric all extremities  Skin:   Skin color, texture, turgor normal, no rashes or lesions  Lymph nodes:   Cervical, supraclavicular, and axillary nodes normal  Neurologic:   CNII-XII intact, normal strength, sensation and gait; reflexes 2+ and symmetric  throughout          Psych:   Normal mood, affect, hygiene and grooming.      ***DIABETIC FOOT EXAM UPDATE ALL    ASSESSMENT/PLAN:  I HAVE NO IDEA WHAT HER INSURANCE IS--I SENT A MESSAGE TO MELISSA TO CLARIFY.  SAYS MEDICARE ON THE LEFT SIDE, HAS CIGNA CARD SCANNED IN 10/2022 WITH NO NEWER INSURANCE CARDS OR MEDICARE CARD Waiting to hear from Melissa if it will be Welcome to Medicare, initial AWV, vs subsequent. Obviously what we need to do today depends on the answer to these questions!   Need list of all of her doctors Has she seen eye doctor for Diabetic eye exam? If so, find out who and get notes.  Prevnar-20 today  Likely needs to f/u with Dr. Almond for GYN f/u  We can do breast exam with today's visit, but defer GYN to her specialists.  Past due  for mammo Never had DEXA  FOOT EXAM TODAY  ***UPDATE DOCUMENTATION TO REFLECT WHAT TYPE OF VISIT THIS IS--?annual physical, IPPE, initial vs subsequent AWV????    Discussed monthly self breast exams and yearly mammograms (past due, reminded to schedule); at least 30 minutes of aerobic activity at least 5 days/week and weight-bearing exercise 2x/week; proper sunscreen use reviewed; healthy diet, including goals of calcium  and vitamin D  intake and alcohol recommendations (less than or equal to 1 drink/day) reviewed; regular seatbelt use; changing batteries in smoke detectors, use of carbon monoxide detectors.  Immunization recommendations discussed--high dose flu shots are recommended yearly.  COVID boosters are recommended.   Prevnar-20 *** Colonoscopy recommendations reviewed, has appointment next month with GI. DEXA recommended, ordered   Medicare Attestation I have personally reviewed: The patient's medical and social history Their use of alcohol, tobacco or illicit drugs Their current medications and supplements The patient's functional ability including ADLs,fall risks, home safety risks, cognitive, and  hearing and visual impairment Diet and physical activities Evidence for depression or mood disorders  The patient's weight, height, BMI have been recorded in the chart.  I have made referrals, counseling, and provided education to the patient based on review of the above and I have provided the patient with a written personalized care plan for preventive services.     Annabelle DELENA Fetters, MD

## 2023-11-05 ENCOUNTER — Ambulatory Visit (INDEPENDENT_AMBULATORY_CARE_PROVIDER_SITE_OTHER): Admitting: Family Medicine

## 2023-11-05 ENCOUNTER — Encounter: Payer: Self-pay | Admitting: Family Medicine

## 2023-11-05 VITALS — BP 120/80 | HR 89 | Ht 59.0 in | Wt 167.4 lb

## 2023-11-05 DIAGNOSIS — Z1231 Encounter for screening mammogram for malignant neoplasm of breast: Secondary | ICD-10-CM

## 2023-11-05 DIAGNOSIS — R809 Proteinuria, unspecified: Secondary | ICD-10-CM

## 2023-11-05 DIAGNOSIS — E1169 Type 2 diabetes mellitus with other specified complication: Secondary | ICD-10-CM

## 2023-11-05 DIAGNOSIS — L231 Allergic contact dermatitis due to adhesives: Secondary | ICD-10-CM

## 2023-11-05 DIAGNOSIS — E559 Vitamin D deficiency, unspecified: Secondary | ICD-10-CM

## 2023-11-05 DIAGNOSIS — K5900 Constipation, unspecified: Secondary | ICD-10-CM

## 2023-11-05 DIAGNOSIS — Z0000 Encounter for general adult medical examination without abnormal findings: Secondary | ICD-10-CM | POA: Diagnosis not present

## 2023-11-05 DIAGNOSIS — Z23 Encounter for immunization: Secondary | ICD-10-CM | POA: Diagnosis not present

## 2023-11-05 DIAGNOSIS — I10 Essential (primary) hypertension: Secondary | ICD-10-CM

## 2023-11-05 DIAGNOSIS — Z5181 Encounter for therapeutic drug level monitoring: Secondary | ICD-10-CM

## 2023-11-05 DIAGNOSIS — E785 Hyperlipidemia, unspecified: Secondary | ICD-10-CM

## 2023-11-05 DIAGNOSIS — Z9359 Other cystostomy status: Secondary | ICD-10-CM

## 2023-11-05 DIAGNOSIS — Z780 Asymptomatic menopausal state: Secondary | ICD-10-CM

## 2023-11-05 LAB — LIPID PANEL

## 2023-11-06 ENCOUNTER — Encounter: Payer: Self-pay | Admitting: Family Medicine

## 2023-11-06 ENCOUNTER — Ambulatory Visit: Payer: Self-pay | Admitting: Family Medicine

## 2023-11-06 LAB — MICROALBUMIN / CREATININE URINE RATIO
Creatinine, Urine: 51.3 mg/dL
Microalb/Creat Ratio: 1058 mg/g{creat} — AB (ref 0–29)
Microalbumin, Urine: 542.7 ug/mL

## 2023-11-06 LAB — COMPREHENSIVE METABOLIC PANEL WITH GFR
ALT: 33 IU/L — AB (ref 0–32)
AST: 33 IU/L (ref 0–40)
Albumin: 4.5 g/dL (ref 3.9–4.9)
Alkaline Phosphatase: 90 IU/L (ref 44–121)
BUN/Creatinine Ratio: 15 (ref 12–28)
BUN: 10 mg/dL (ref 8–27)
Bilirubin Total: 0.3 mg/dL (ref 0.0–1.2)
CO2: 21 mmol/L (ref 20–29)
Calcium: 10.4 mg/dL — AB (ref 8.7–10.3)
Chloride: 99 mmol/L (ref 96–106)
Creatinine, Ser: 0.66 mg/dL (ref 0.57–1.00)
Globulin, Total: 3.4 g/dL (ref 1.5–4.5)
Glucose: 133 mg/dL — AB (ref 70–99)
Potassium: 3.9 mmol/L (ref 3.5–5.2)
Sodium: 141 mmol/L (ref 134–144)
Total Protein: 7.9 g/dL (ref 6.0–8.5)
eGFR: 94 mL/min/1.73 (ref >59)

## 2023-11-06 LAB — CBC WITH DIFFERENTIAL/PLATELET
Basophils Absolute: 0 x10E3/uL (ref 0.0–0.2)
Basos: 0 %
EOS (ABSOLUTE): 0.1 x10E3/uL (ref 0.0–0.4)
Eos: 1 %
Hematocrit: 40.1 % (ref 34.0–46.6)
Hemoglobin: 11.8 g/dL (ref 11.1–15.9)
Immature Grans (Abs): 0 x10E3/uL (ref 0.0–0.1)
Immature Granulocytes: 0 %
Lymphocytes Absolute: 3.7 x10E3/uL — ABNORMAL HIGH (ref 0.7–3.1)
Lymphs: 40 %
MCH: 22.3 pg — ABNORMAL LOW (ref 26.6–33.0)
MCHC: 29.4 g/dL — ABNORMAL LOW (ref 31.5–35.7)
MCV: 76 fL — ABNORMAL LOW (ref 79–97)
Monocytes Absolute: 0.6 x10E3/uL (ref 0.1–0.9)
Monocytes: 6 %
Neutrophils Absolute: 4.9 x10E3/uL (ref 1.4–7.0)
Neutrophils: 53 %
Platelets: 321 x10E3/uL (ref 150–450)
RBC: 5.29 x10E6/uL — ABNORMAL HIGH (ref 3.77–5.28)
RDW: 16.5 % — ABNORMAL HIGH (ref 11.7–15.4)
WBC: 9.3 x10E3/uL (ref 3.4–10.8)

## 2023-11-06 LAB — LIPID PANEL
Cholesterol, Total: 132 mg/dL (ref 100–199)
HDL: 47 mg/dL (ref >39)
LDL CALC COMMENT:: 2.8 ratio (ref 0.0–4.4)
LDL Chol Calc (NIH): 49 mg/dL (ref 0–99)
Triglycerides: 229 mg/dL — AB (ref 0–149)
VLDL Cholesterol Cal: 36 mg/dL (ref 5–40)

## 2023-11-06 LAB — TSH: TSH: 1.11 u[IU]/mL (ref 0.450–4.500)

## 2023-11-06 LAB — VITAMIN D 25 HYDROXY (VIT D DEFICIENCY, FRACTURES): Vit D, 25-Hydroxy: 32.5 ng/mL (ref 30.0–100.0)

## 2023-11-09 ENCOUNTER — Telehealth: Payer: Self-pay

## 2023-11-09 NOTE — Telephone Encounter (Signed)
 I called pt. Back and went over her labs with her and recommendations.   Copied from CRM [id number]. Topic: Clinical - Lab/Test Results >> Nov 09, 2023 10:16 AM Travis F wrote: Reason for CRM: Patient's friend Earleen is calling in returning a call from [place]. Informed Earleen it was regarding the lab results, offered to read them, and she declined and requested to speak to who called her. Also informed her the lab results would be mailed. Earleen says the last time she spoke with someone they read the results, but she keeps getting calls.

## 2023-11-27 ENCOUNTER — Encounter: Payer: Self-pay | Admitting: Gastroenterology

## 2023-11-27 ENCOUNTER — Ambulatory Visit (INDEPENDENT_AMBULATORY_CARE_PROVIDER_SITE_OTHER): Admitting: Gastroenterology

## 2023-11-27 VITALS — BP 122/80 | HR 83 | Ht <= 58 in | Wt 168.0 lb

## 2023-11-27 DIAGNOSIS — R140 Abdominal distension (gaseous): Secondary | ICD-10-CM

## 2023-11-27 DIAGNOSIS — Z1211 Encounter for screening for malignant neoplasm of colon: Secondary | ICD-10-CM

## 2023-11-27 DIAGNOSIS — K5909 Other constipation: Secondary | ICD-10-CM

## 2023-11-27 MED ORDER — NA SULFATE-K SULFATE-MG SULF 17.5-3.13-1.6 GM/177ML PO SOLN: 1.0000 | Freq: Once | ORAL | 0 refills | Status: AC

## 2023-11-27 NOTE — Patient Instructions (Addendum)
 Constipation Recommend high fiber diet Samples of Linzess 72 mcg po daily 1 tablet 30-45 minutes before breakfast with full glass of water , may have loose stools first few days you start medication  We have sent the following medications to your pharmacy for you to pick up at your convenience: SUPREP  You have been scheduled for a colonoscopy. Please follow written instructions given to you at your visit today.   If you use inhalers (even only as needed), please bring them with you on the day of your procedure.  DO NOT TAKE 7 DAYS PRIOR TO TEST- Trulicity (dulaglutide) Ozempic, Wegovy (semaglutide) Mounjaro (tirzepatide) Bydureon Bcise (exanatide extended release)  DO NOT TAKE 1 DAY PRIOR TO YOUR TEST Rybelsus (semaglutide) Adlyxin (lixisenatide) Victoza (liraglutide) Byetta (exanatide) ___________________________________________________________________________  Due to recent changes in healthcare laws, you may see the results of your imaging and laboratory studies on MyChart before your provider has had a chance to review them.  We understand that in some cases there may be results that are confusing or concerning to you. Not all laboratory results come back in the same time frame and the provider may be waiting for multiple results in order to interpret others.  Please give us  48 hours in order for your provider to thoroughly review all the results before contacting the office for clarification of your results.   _______________________________________________________  If your blood pressure at your visit was 140/90 or greater, please contact your primary care physician to follow up on this.  _______________________________________________________  If you are age 70 or older, your body mass index should be between 23-30. Your Body mass index is 35.11 kg/m. If this is out of the aforementioned range listed, please consider follow up with your Primary Care Provider.  If you are age  35 or younger, your body mass index should be between 19-25. Your Body mass index is 35.11 kg/m. If this is out of the aformentioned range listed, please consider follow up with your Primary Care Provider.   ________________________________________________________  The [facility] providers would like to encourage you to use MYCHART to communicate with providers for non-urgent requests or questions.  Due to long hold times on the telephone, sending your provider a message by [facility] may be a faster and more efficient way to get a response.  Please allow 48 business hours for a response.  Please remember that this is for non-urgent requests.  _______________________________________________________  [facility] is using a team-based approach to care.  Your team is made up of your doctor and two to three APPS. Our APPS (Nurse Practitioners and Physician Assistants) work with your physician to ensure care continuity for you. They are fully qualified to address your health concerns and develop a treatment plan. They communicate directly with your gastroenterologist to care for you. Seeing the Advanced Practice Practitioners on your physician's team can help you by facilitating care more promptly, often allowing for earlier appointments, access to diagnostic testing, procedures, and other specialty referrals.   Thank you for trusting me with your gastrointestinal care. Deanna May, NP-C

## 2023-11-27 NOTE — Progress Notes (Signed)
 I agree with the assessment and plan as outlined by Ms. May.

## 2023-11-27 NOTE — Progress Notes (Signed)
 Chief Complaint:colon cancer screening, constipation Primary GI Doctor: Dr. Federico  HPI:  Patient is a  68  year old female patient with past medical history of extramammary Paget's disease, s/p vulvectomy in 2018, DM, and hypertension, who was referred to me by Randol Dawes, MD on 08/27/23 for a evaluation of colon cancer screening, constipation.    Interval History   Patient presents for evaluation of constipation and colonoscopy, accompanied by her sister who translates certain words for her. She reports she has had issues with constipation for the past year.  She is using dulocolax every other day. She will have multiple bowel movements that day, but then go a day without having bowel movement. She has a lot of straining. No blood in stool. She stopped taking Miralax, didn't seem to help. She has had abdominal cramping around the suprapubic catheter described as muscle spasms. Her urologist prescribed Myrbetriq  which she states helped some. No nausea. Appetite good.   Patient never had colonoscopy.   No alcohol use. Nonsmoker.  No blood thinners.   Surgical history:suprapubic catheter placed  No family history of GI issues or CA.  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    Past Surgical History:  Procedure Laterality Date   CO2  LASER APPLICATION N/A 10/19/2017   Procedure: CO2 LASER APPLICATION OF THE VULVA;  Surgeon: Eloy Herring, MD;  Location: [facility];  Service: Gynecology;  Laterality: N/A;   CO2 LASER APPLICATION N/A 09/21/2021   Procedure: VAGINOSCOPY, VULVAR BIOPSIES;  Surgeon: Viktoria Comer SAUNDERS, MD;  Location: [facility];  Service: Gynecology;  Laterality: N/A;   CYSTOSCOPY  06/22/2022   Procedure: CYSTOSCOPY with BLADDER BIOPSY;  Surgeon: Marilynne Rosaline SAILOR, MD;  Location: [facility];  Service: Gynecology;;   EUA/ VULVA BIOPSY  09-01-2016  dr dove  [facility]   GRAFT APPLICATION  06/22/2022   Procedure: GRAFT APPLICATION;  Surgeon: Marilynne Rosaline SAILOR, MD;  Location: [facility];  Service: Gynecology;;   LABIOPLASTY N/A 11/01/2022   Procedure: lysis of adhesions of labia / vulva;  Surgeon: Viktoria Comer SAUNDERS, MD;  Location: [facility];  Service: Gynecology;  Laterality: N/A;   RADICAL VULVECTOMY N/A 09/21/2016   Procedure: RADICAL VULVECTOMY COMPLETE;  Surgeon: Eloy Herring, MD;  Location: [facility];  Service: Gynecology;  Laterality: N/A;   TRANSTHORACIC ECHOCARDIOGRAM  01/16/2017   ef 60-65%,  grade 1 diastolic dysfunction/  very mild AV stenosis with moderately thickened and calcified leaflets (valve area 1.85cm^2)/  moderate LAE   VULVA /PERINEUM BIOPSY N/A 09/13/2017   Procedure: VULVAR BIOPSY;  Surgeon: Eloy Herring, MD;  Location: [facility];  Service: Gynecology;  Laterality: N/A;   VULVA MARYBETH BIOPSY N/A 08/31/2021   Procedure: VULVAR BIOPSY;  Surgeon: Rogelio Planas, MD;  Location: [facility];  Service: Gynecology;  Laterality: N/A;   VULVECTOMY N/A 06/22/2022   Procedure: WIDE LOCAL EXCISION VULVECTOMY;  Surgeon: Viktoria Comer SAUNDERS, MD;  Location: [facility];  Service: Gynecology;  Laterality: N/A;    Current Outpatient Medications  Medication Sig Dispense Refill   acetaminophen  (TYLENOL ) 500  MG tablet Take 500 mg by mouth every 6 (six) hours as needed.     amLODipine  (NORVASC ) 10 MG tablet TAKE 1 TABLET BY MOUTH DAILY 180 tablet 1   atorvastatin  (LIPITOR) 40 MG tablet Take 1 tablet (40 mg total) by mouth daily. 90 tablet 0   bisacodyl (DULCOLAX) 5 MG EC tablet Take 5 mg by mouth every other day.     Blood Glucose Monitoring Suppl DEVI 1 each by Does not apply route in the morning and at bedtime. [place]a Darko substitute to any manufacturer covered by patient's insurance. 1 each 0   estradiol  (ESTRACE  VAGINAL) 0.1 MG/GM vaginal cream Apply a dime size amount of cream on your finger at bedtime and apply around the urinary opening on the vulva every night for the next month. 42.5 g 5   ibuprofen  (ADVIL ) 600 MG tablet Take 600 mg by mouth every 6 (six) hours as needed.     Melatonin 10 MG CAPS Take 10 mg by mouth at bedtime as needed (sleep).     metFORMIN  (GLUCOPHAGE ) 500 MG tablet TAKE 1 TABLET BY MOUTH 2 TIMES A DAY WITH A MEAL 180 tablet 0   mirabegron  ER (MYRBETRIQ ) 50 MG TB24 tablet Take 1 tablet by mouth daily.     Multiple Vitamin (MULTIVITAMIN WITH MINERALS) TABS tablet Take 1 tablet by mouth daily. Centrum     Na Sulfate-K Sulfate-Mg Sulfate concentrate (SUPREP) 17.5-3.13-1.6 GM/177ML SOLN Take 1 kit (354 mLs total) by mouth once for 1 dose. 354 mL 0   Omega-3 Fatty Acids (FISH OIL) 1000 MG CPDR Take 1 capsule by mouth daily.     valsartan  (DIOVAN ) 40 MG tablet Take 1 tablet (40 mg total) by mouth daily. 90 tablet 0   No current facility-administered medications for this visit.    Allergies as of 11/27/2023 - Review Complete 11/27/2023  Allergen Reaction Noted   Gabapentin  Other (See Comments) 07/14/2022   Hydrocodone  Itching 07/14/2022    Family History  Problem Relation Age of Onset   Stroke Mother    Arthritis Mother    Diabetes Mother    Diabetes Father    Breast cancer Daughter        recurrent, and widely metastatic   Cancer Daughter    Colon cancer Neg Hx     Esophageal cancer Neg Hx    Liver disease Neg Hx     Review of Systems:    Constitutional: No weight loss, fever, chills, weakness or fatigue HEENT: Eyes: No change in vision               Ears, Nose, Throat:  No change in hearing or congestion Skin: No rash or itching Cardiovascular: No chest pain, chest pressure or palpitations   Respiratory: No SOB or cough Gastrointestinal: See HPI and otherwise negative Genitourinary: No dysuria or change in urinary frequency Neurological: No headache, dizziness or syncope Musculoskeletal: No new muscle or joint pain Hematologic: No bleeding or bruising Psychiatric: No history of depression or anxiety  Physical Exam:  Vital signs: BP 122/80   Pulse 83   Ht 4' 10 (1.473 m)   Wt 168 lb (76.2 kg)   BMI 35.11 kg/m   Constitutional:   Pleasant female appears to be in NAD, Well developed, Well nourished, alert and cooperative Eyes:   PEERL, EOMI. No icterus. Conjunctiva pink. Neck:  Supple Throat: Oral cavity and pharynx without inflammation, swelling or lesion.  Respiratory: Respirations even and unlabored. Lungs clear to auscultation bilaterally.   No wheezes, crackles, or rhonchi.  Cardiovascular: Normal S1, S2. Regular rate and rhythm. No peripheral edema, cyanosis or pallor.  Gastrointestinal:  Soft, distended, nontender. No rebound or guarding. Normal bowel sounds. No appreciable masses or hepatomegaly. Rectal:  Not performed.  Msk:  Symmetrical without gross deformities. Without edema, no deformity or joint abnormality.  Neurologic:  Alert and  oriented x4;  grossly normal neurologically.  Skin:   Dry and intact without significant lesions or rashes.  RELEVANT LABS AND IMAGING: CBC    Latest Ref Rng & Units 11/05/2023    4:06 PM 01/08/2023   12:09 PM 10/31/2022    8:06 AM  CBC  WBC 3.4 - 10.8 x10E3/uL 9.3  8.3  9.2   Hemoglobin 11.1 - 15.9 g/dL 88.1  89.3  88.8   Hematocrit 34.0 - 46.6 % 40.1  33.4  37.8   Platelets 150 - 450  x10E3/uL 321  354  416      CMP     Latest Ref Rng & Units 11/05/2023    4:06 PM 01/24/2023   12:50 PM 01/08/2023   12:09 PM  CMP  Glucose 70 - 99 mg/dL 866  94  887   BUN 8 - 27 mg/dL 10  10  8    Creatinine 0.57 - 1.00 mg/dL 9.33  9.36  9.37   Sodium 134 - 144 mmol/L 141  138  125   Potassium 3.5 - 5.2 mmol/L 3.9  4.1  3.8   Chloride 96 - 106 mmol/L 99  99  91   CO2 20 - 29 mmol/L 21  24  26    Calcium  8.7 - 10.3 mg/dL 89.5  9.9  9.7   Total Protein 6.0 - 8.5 g/dL 7.9     Total Bilirubin 0.0 - 1.2 mg/dL 0.3     Alkaline Phos 44 - 121 IU/L 90     AST 0 - 40 IU/L 33     ALT 0 - 32 IU/L 33        Lab Results  Component Value Date   TSH 1.110 11/05/2023  12/2016 echo- The estimated ejection fraction was in the range of 60% to 65%.  12/2022 CTAP: IMPRESSION: Heterogeneous enhancement of the right kidney with some stranding. Please correlate for history of pyelonephritis or other process. Recommend follow-up. Foley catheter in the bladder which is only mildly distended. There is significant wall thickening. Fatty liver infiltration. Stable surgical changes in the pelvis as per provided history.  Assessment: Encounter Diagnoses  Name Primary?   Chronic constipation Yes   Encounter for screening for malignant neoplasm of colon      68 year old female patient who presents for constipation not improved with over-the-counter MiraLAX and/or Dulcolax.  Patient continues with a lot of abdominal discomfort, bloating, and straining.  Will go ahead and provide samples of Linzess 72 mcg p.o. daily to see if this helps regulate her bowels.  Also recommended high-fiber diet.  Patient also has never had colon screening colonoscopy we will go  ahead and schedule with 2-day prep.  Plan: - Recommend high fiber diet  -Samples of Linzess 72 mcg po daily  -Schedule for a colonoscopy with 2 day prep in [facility] with Dr. Federico . The risks and benefits of colonoscopy with possible polypectomy / biopsies  were discussed and the patient agrees to proceed.    Thank you for the courtesy of this consult. Please call me with any questions or concerns.   Jalyric Kaestner, FNP-C [facility] 11/27/2023, 12:55 PM  Cc: Randol Dawes, MD

## 2023-12-27 ENCOUNTER — Ambulatory Visit (AMBULATORY_SURGERY_CENTER): Admitting: Internal Medicine

## 2023-12-27 ENCOUNTER — Encounter: Payer: Self-pay | Admitting: Internal Medicine

## 2023-12-27 VITALS — BP 134/77 | HR 69 | Temp 97.0°F | Resp 16 | Ht <= 58 in | Wt 168.0 lb

## 2023-12-27 DIAGNOSIS — D128 Benign neoplasm of rectum: Secondary | ICD-10-CM

## 2023-12-27 DIAGNOSIS — K648 Other hemorrhoids: Secondary | ICD-10-CM

## 2023-12-27 DIAGNOSIS — D120 Benign neoplasm of cecum: Secondary | ICD-10-CM

## 2023-12-27 DIAGNOSIS — D122 Benign neoplasm of ascending colon: Secondary | ICD-10-CM | POA: Diagnosis not present

## 2023-12-27 DIAGNOSIS — K621 Rectal polyp: Secondary | ICD-10-CM

## 2023-12-27 DIAGNOSIS — D123 Benign neoplasm of transverse colon: Secondary | ICD-10-CM | POA: Diagnosis not present

## 2023-12-27 DIAGNOSIS — Z1211 Encounter for screening for malignant neoplasm of colon: Secondary | ICD-10-CM

## 2023-12-27 DIAGNOSIS — K635 Polyp of colon: Secondary | ICD-10-CM

## 2023-12-27 DIAGNOSIS — D124 Benign neoplasm of descending colon: Secondary | ICD-10-CM

## 2023-12-27 MED ORDER — SODIUM CHLORIDE 0.9 % IV SOLN: 500.0000 mL | INTRAVENOUS | Status: AC

## 2023-12-27 NOTE — Progress Notes (Signed)
Report given care assumed

## 2023-12-27 NOTE — Progress Notes (Signed)
 GASTROENTEROLOGY PROCEDURE H&P NOTE   Primary Care Physician: Randol Dawes, MD    Reason for Procedure:   Colon cancer screening  Plan:    Colonoscopy  Patient is appropriate for endoscopic procedure(s) in the ambulatory ([facility]) setting.  The nature of the procedure, as well as the risks, benefits, and alternatives were carefully and thoroughly reviewed with the patient. Ample time for discussion and questions allowed. The patient understood, was satisfied, and agreed to proceed.     HPI: Ellen Ryan is a 68 y.o. female who presents for colonoscopy for evaluation of colon cancer screening.  Patient was most recently seen in the Gastroenterology Clinic on 11/27/23.  No interval change in medical history since that appointment. Please refer to that note for full details regarding GI history and clinical presentation.   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    Past Surgical History:  Procedure Laterality Date   CO2 LASER APPLICATION N/A 10/19/2017   Procedure: CO2 LASER APPLICATION OF THE VULVA;  Surgeon: Eloy Herring, MD;  Location: [facility];  Service: Gynecology;  Laterality: N/A;   CO2 LASER APPLICATION N/A 09/21/2021   Procedure: VAGINOSCOPY, VULVAR BIOPSIES;  Surgeon: Viktoria Comer SAUNDERS, MD;  Location: [facility];  Service: Gynecology;  Laterality: N/A;   CYSTOSCOPY  06/22/2022   Procedure: CYSTOSCOPY with BLADDER BIOPSY;   Surgeon: Marilynne Rosaline SAILOR, MD;  Location: [facility];  Service: Gynecology;;   EUA/ VULVA BIOPSY  09-01-2016  dr dove  [facility]   GRAFT APPLICATION  06/22/2022   Procedure: GRAFT APPLICATION;  Surgeon: Marilynne Rosaline SAILOR, MD;  Location: [facility];  Service: Gynecology;;   LABIOPLASTY N/A 11/01/2022   Procedure: lysis of adhesions of labia / vulva;  Surgeon: Viktoria Comer SAUNDERS, MD;  Location: [facility];  Service: Gynecology;  Laterality: N/A;   RADICAL VULVECTOMY N/A 09/21/2016   Procedure: RADICAL VULVECTOMY COMPLETE;  Surgeon: Eloy Herring, MD;  Location: [facility];  Service: Gynecology;  Laterality: N/A;   TRANSTHORACIC ECHOCARDIOGRAM  01/16/2017   ef 60-65%,  grade 1 diastolic dysfunction/  very mild AV stenosis with moderately thickened and calcified leaflets (valve area 1.85cm^2)/  moderate LAE   VULVA /PERINEUM BIOPSY N/A 09/13/2017   Procedure: VULVAR BIOPSY;  Surgeon: Eloy Herring, MD;  Location: [facility];  Service: Gynecology;  Laterality: N/A;   VULVA MARYBETH BIOPSY N/A 08/31/2021   Procedure: VULVAR BIOPSY;  Surgeon: Rogelio Planas, MD;  Location: [facility];  Service: Gynecology;  Laterality: N/A;   VULVECTOMY N/A 06/22/2022   Procedure: WIDE LOCAL EXCISION VULVECTOMY;  Surgeon: Viktoria Comer SAUNDERS, MD;  Location: [facility];  Service: Gynecology;  Laterality: N/A;    Prior to Admission medications   Medication Sig Start Date End Date Taking? Authorizing Provider  acetaminophen  (  TYLENOL ) 500 MG tablet Take 500 mg by mouth every 6 (six) hours as needed.    [provider]  amLODipine  (NORVASC ) 10 MG tablet TAKE 1 TABLET BY MOUTH DAILY 02/20/23   Randol Dawes, MD  atorvastatin  (LIPITOR) 40 MG tablet Take 1 tablet (40 mg total) by mouth daily. 07/26/23   Randol Dawes, MD  bisacodyl (DULCOLAX) 5 MG EC tablet Take 5 mg by mouth every other day.    [provider]  Blood Glucose Monitoring Suppl  DEVI 1 each by Does not apply route in the morning and at bedtime. May substitute to any manufacturer covered by patient's insurance. 11/16/22   Randol Dawes, MD  estradiol  (ESTRACE  VAGINAL) 0.1 MG/GM vaginal cream Apply a dime size amount of cream on your finger at bedtime and apply around the urinary opening on the vulva every night for the next month. 12/22/22   Cross, Melissa D, NP  ibuprofen  (ADVIL ) 600 MG tablet Take 600 mg by mouth every 6 (six) hours as needed.    [provider]  Melatonin 10 MG CAPS Take 10 mg by mouth at bedtime as needed (sleep).    [provider]  metFORMIN  (GLUCOPHAGE ) 500 MG tablet TAKE 1 TABLET BY MOUTH 2 TIMES A DAY WITH A MEAL 10/29/23   Randol Dawes, MD  mirabegron  ER (MYRBETRIQ ) 50 MG TB24 tablet Take 1 tablet by mouth daily. 08/23/23   [provider]  Multiple Vitamin (MULTIVITAMIN WITH MINERALS) TABS tablet Take 1 tablet by mouth daily. Centrum    [provider]  Omega-3 Fatty Acids (FISH OIL) 1000 MG CPDR Take 1 capsule by mouth daily.    [provider]  valsartan  (DIOVAN ) 40 MG tablet Take 1 tablet (40 mg total) by mouth daily. 07/26/23   Randol Dawes, MD    Current Outpatient Medications  Medication Sig Dispense Refill   acetaminophen  (TYLENOL ) 500 MG tablet Take 500 mg by mouth every 6 (six) hours as needed.     amLODipine  (NORVASC ) 10 MG tablet TAKE 1 TABLET BY MOUTH DAILY 180 tablet 1   atorvastatin  (LIPITOR) 40 MG tablet Take 1 tablet (40 mg total) by mouth daily. 90 tablet 0   bisacodyl (DULCOLAX) 5 MG EC tablet Take 5 mg by mouth every other day.     Blood Glucose Monitoring Suppl DEVI 1 each by Does not apply route in the morning and at bedtime. May substitute to any manufacturer covered by patient's insurance. 1 each 0   estradiol  (ESTRACE  VAGINAL) 0.1 MG/GM vaginal cream Apply a dime size amount of cream on your finger at bedtime and apply around the urinary opening on the vulva every night for the next month.  42.5 g 5   ibuprofen  (ADVIL ) 600 MG tablet Take 600 mg by mouth every 6 (six) hours as needed.     Melatonin 10 MG CAPS Take 10 mg by mouth at bedtime as needed (sleep).     metFORMIN  (GLUCOPHAGE ) 500 MG tablet TAKE 1 TABLET BY MOUTH 2 TIMES A DAY WITH A MEAL 180 tablet 0   mirabegron  ER (MYRBETRIQ ) 50 MG TB24 tablet Take 1 tablet by mouth daily.     Multiple Vitamin (MULTIVITAMIN WITH MINERALS) TABS tablet Take 1 tablet by mouth daily. Centrum     Omega-3 Fatty Acids (FISH OIL) 1000 MG CPDR Take 1 capsule by mouth daily.     valsartan  (DIOVAN ) 40 MG tablet Take 1 tablet (40 mg total) by mouth daily. 90 tablet 0   Current  Facility-Administered Medications  Medication Dose Route Frequency Provider Last Rate Last Admin   0.9 %  sodium chloride  infusion  500 mL Intravenous Continuous Federico Rosario BROCKS, MD        Allergies as of 12/27/2023 - Review Complete 11/27/2023  Allergen Reaction Noted   Gabapentin  Other (See Comments) 07/14/2022   Hydrocodone  Itching 07/14/2022    Family History  Problem Relation Age of Onset   Stroke Mother    Arthritis Mother    Diabetes Mother    Diabetes Father    Breast cancer Daughter        recurrent, and widely metastatic   Cancer Daughter    Colon cancer Neg Hx    Esophageal cancer Neg Hx    Liver disease Neg Hx     Social History   Socioeconomic History   Marital status: Widowed    Spouse name: Not on file   Number of children: 5   Years of education: Not on file   Highest education level: Not on file  Occupational History   Occupation: retired  Tobacco Use   Smoking status: Never   Smokeless tobacco: Never  Vaping Use   Vaping status: Never Used  Substance and Sexual Activity   Alcohol use: Not Currently   Drug use: No   Sexual activity: Not Currently    Birth control/protection: Post-menopausal  Other Topics Concern   Not on file  Social History Narrative   Lives in [place].   Visiting family in [place]   (Daughter--who passed away  10-01-2022)   She now lives with a sister (since her daughter passed away).   This is a different sister than the one listed as her emergency contact, who comes to visit with her [place])      Updated 08/2023   Social Drivers of Health   Financial Resource Strain: Low Risk  (11/06/2023)   Overall Financial Resource Strain (CARDIA)    Difficulty of Paying Living Expenses: Not hard at all  Food Insecurity: No Food Insecurity (11/06/2023)   Hunger Vital Sign    Worried About Running Out of Food in the Last Year: Never true    Ran Out of Food in the Last Year: Never true  Transportation Needs: No Transportation Needs (11/06/2023)   PRAPARE - Administrator, Civil Service (Medical): No    Lack of Transportation (Non-Medical): No  Physical Activity: Insufficiently Active (11/06/2023)   Exercise Vital Sign    Days of Exercise per Week: 3 days    Minutes of Exercise per Session: 10 min  Stress: No Stress Concern Present (11/06/2023)   [employer] of Occupational Health - Occupational Stress Questionnaire    Feeling of Stress: Only a little  Social Connections: Unknown (11/06/2023)   Social Connection and Isolation Panel    Frequency of Communication with Friends and Family: Three times a week    Frequency of Social Gatherings with Friends and Family: Twice a week    Attends Religious Services: More than 4 times per year    Active Member of [employer] or Organizations: Yes    Attends Banker Meetings: Patient unable to answer    Marital Status: Not on file  Intimate Partner Violence: Not At Risk (11/06/2023)   Humiliation, Afraid, Rape, and Kick questionnaire    Fear of Current or Ex-Partner: No    Emotionally Abused: No    Physically Abused: No    Sexually Abused: No    Physical Exam: Vital signs  in last 24 hours: BP 133/83   Pulse 66   Temp (!) 97 F (36.1 C) (Skin)   Ht 4' 10 (1.473 m)   Wt 168 lb (76.2 kg)   SpO2 96%   BMI 35.11 kg/m  GEN:  NAD EYE: Sclerae anicteric ENT: MMM CV: Non-tachycardic Pulm: No increased WOB GI: Soft NEURO:  Alert & Oriented   Estefana Kidney, MD [facility]   12/27/2023 2:54 PM

## 2023-12-27 NOTE — Op Note (Addendum)
 [facility] Patient Name: Ellen Ryan Procedure Date: 12/27/2023 3:26 PM MRN: [id number] Endoscopist: Rosario Estefana Kidney , , [id number] Age: 68 Referring MD:  Date of Birth: [birth year] Gender: Female Account #: [id number] Procedure:                Colonoscopy Indications:              Screening for colorectal malignant neoplasm, This                            is the patient's first colonoscopy Medicines:                Monitored Anesthesia Care Procedure:                Pre-Anesthesia Assessment:                           - Prior to the procedure, a History and Physical                            was performed, and patient medications and                            allergies were reviewed. The patient's tolerance of                            previous anesthesia was also reviewed. The risks                            and benefits of the procedure and the sedation                            options and risks were discussed with the patient.                            All questions were answered, and informed consent                            was obtained. Prior Anticoagulants: The patient has                            taken no anticoagulant or antiplatelet agents. ASA                            Grade Assessment: III - A patient with severe                            systemic disease. After reviewing the risks and                            benefits, the patient was deemed in satisfactory                            condition to undergo the procedure.  After obtaining informed consent, the colonoscope                            was passed under direct vision. Throughout the                            procedure, the patient's blood pressure, pulse, and                            oxygen saturations were monitored continuously. The                            Olympus Scope [id number] was introduced through the                            anus and  advanced to the the cecum, identified by                            appendiceal orifice and ileocecal valve. The                            colonoscopy was performed without difficulty. The                            patient tolerated the procedure well. The quality                            of the bowel preparation was excellent. The                            ileocecal valve, appendiceal orifice, and rectum                            were photographed. Scope In: 3:31:56 PM Scope Out: 4:03:11 PM Scope Withdrawal Time: 0 hours 20 minutes 32 seconds  Total Procedure Duration: 0 hours 31 minutes 15 seconds  Findings:                 The terminal ileum appeared normal.                           Four sessile polyps were found in the transverse                            colon, ascending colon and cecum. The polyps were 3                            to 7 mm in size. These polyps were removed with a                            cold snare. Resection and retrieval were complete.                           Two sessile polyps were found in the rectum  and                            descending colon. The polyps were 3 to 5 mm in                            size. These polyps were removed with a cold snare.                            Resection and retrieval were complete.                           Non-bleeding internal hemorrhoids were found during                            retroflexion. Complications:            No immediate complications. Estimated Blood Loss:     Estimated blood loss was minimal. Impression:               - The examined portion of the ileum was normal.                           - Four 3 to 7 mm polyps in the transverse colon, in                            the ascending colon and in the cecum, removed with                            a cold snare. Resected and retrieved.                           - Two 3 to 5 mm polyps in the rectum and in the                            descending  colon, removed with a cold snare.                            Resected and retrieved.                           - Non-bleeding internal hemorrhoids. Recommendation:           - Discharge patient to home (with escort).                           - Await pathology results.                           - Will schedule follow up in a few months for                            constipation.                           - The findings and  recommendations were discussed                            with the patient. Dr Estefana Federico Rosario Estefana Federico,  12/27/2023 4:06:28 PM

## 2023-12-27 NOTE — Progress Notes (Signed)
 Called to room to assist during endoscopic procedure.  Patient ID and intended procedure confirmed with present staff. Received instructions for my participation in the procedure from the performing physician.

## 2023-12-27 NOTE — Patient Instructions (Signed)
 Resume previous diet. Continue present medications. Awaiting pathology results. Handouts provided on polyps and hemorrhoids.   YOU HAD AN ENDOSCOPIC PROCEDURE TODAY AT THE [facility]:   Refer to the procedure report that was given to you for any specific questions about what was found during the examination.  If the procedure report does not answer your questions, please call your gastroenterologist to clarify.  If you requested that your care partner not be given the details of your procedure findings, then the procedure report has been included in a sealed envelope for you to review at your convenience later.  YOU SHOULD EXPECT: Some feelings of bloating in the abdomen. Passage of more gas than usual.  Walking can help get rid of the air that was put into your GI tract during the procedure and reduce the bloating. If you had a lower endoscopy (such as a colonoscopy or flexible sigmoidoscopy) you may notice spotting of blood in your stool or on the toilet paper. If you underwent a bowel prep for your procedure, you may not have a normal bowel movement for a few days.  Please Note:  You might notice some irritation and congestion in your nose or some drainage.  This is from the oxygen used during your procedure.  There is no need for concern and it should clear up in a day or so.  SYMPTOMS TO REPORT IMMEDIATELY:  Following lower endoscopy (colonoscopy or flexible sigmoidoscopy):  Excessive amounts of blood in the stool  Significant tenderness or worsening of abdominal pains  Swelling of the abdomen that is new, acute  Fever of 100F or higher  For urgent or emergent issues, a gastroenterologist can be reached at any hour by calling [phone]. Do not use MyChart messaging for urgent concerns.    DIET:  We do recommend a small meal at first, but then you may proceed to your regular diet.  Drink plenty of fluids but you should avoid alcoholic beverages for 24  hours.  ACTIVITY:  You should plan to take it easy for the rest of today and you should NOT DRIVE or use heavy machinery until tomorrow (because of the sedation medicines used during the test).    FOLLOW UP: Our staff will call the number listed on your records the next business day following your procedure.  We will call around 7:15- 8:00 am to check on you and address any questions or concerns that you may have regarding the information given to you following your procedure. If we do not reach you, we will leave a message.     If any biopsies were taken you will be contacted by phone or by letter within the next 1-3 weeks.  Please call us  at [phone] if you have not heard about the biopsies in 3 weeks.    SIGNATURES/CONFIDENTIALITY: You and/or your care partner have signed paperwork which will be entered into your electronic medical record.  These signatures attest to the fact that that the information above on your After Visit Summary has been reviewed and is understood.  Full responsibility of the confidentiality of this discharge information lies with you and/or your care-partner.

## 2023-12-28 ENCOUNTER — Telehealth: Payer: Self-pay | Admitting: Lactation Services

## 2023-12-28 NOTE — Telephone Encounter (Signed)
  Follow up Call-     12/27/2023    2:41 PM  Call back number  Post procedure Call Back phone  # [phone]  Permission to leave phone message Yes     Patient questions:  Do you have a fever, pain , or abdominal swelling? No. Pain Score  0 *  Have you tolerated food without any problems? Yes.    Have you been able to return to your normal activities? Yes.    Do you have any questions about your discharge instructions: Diet   No. Medications  No. Follow up visit  No.  Do you have questions or concerns about your Care? No.  Actions: * If pain score is 4 or above: No action needed, pain <4.

## 2024-01-01 LAB — SURGICAL PATHOLOGY

## 2024-01-02 ENCOUNTER — Ambulatory Visit: Payer: Self-pay | Admitting: Internal Medicine

## 2024-01-16 ENCOUNTER — Other Ambulatory Visit: Payer: Self-pay | Admitting: *Deleted

## 2024-01-16 ENCOUNTER — Telehealth: Payer: Self-pay | Admitting: *Deleted

## 2024-01-16 MED ORDER — GLUCOSE BLOOD VI STRP: 1.0000 | ORAL_STRIP | Freq: Two times a day (BID) | 5 refills | Status: DC

## 2024-01-16 MED ORDER — ONETOUCH DELICA PLUS LANCET30G MISC: 1.0000 | Freq: Two times a day (BID) | 5 refills | Status: DC

## 2024-01-16 NOTE — Telephone Encounter (Signed)
 Copied from CRM #[id number]. Topic: Clinical - Medication Question >> Jan 16, 2024 12:12 PM Santiya F wrote: Reason for CRM: Patient's sister is calling in on behalf of patient. Patient is requesting a new prescription for lancets and test strips be sent over to [facility] pharmacy on [address].   Done. Patient advised.

## 2024-01-21 ENCOUNTER — Other Ambulatory Visit: Payer: Self-pay | Admitting: Family Medicine

## 2024-01-21 DIAGNOSIS — R809 Proteinuria, unspecified: Secondary | ICD-10-CM

## 2024-01-21 DIAGNOSIS — E1169 Type 2 diabetes mellitus with other specified complication: Secondary | ICD-10-CM

## 2024-01-27 ENCOUNTER — Other Ambulatory Visit: Payer: Self-pay | Admitting: Family Medicine

## 2024-01-31 ENCOUNTER — Other Ambulatory Visit: Payer: Self-pay | Admitting: *Deleted

## 2024-01-31 ENCOUNTER — Telehealth: Payer: Self-pay | Admitting: Family Medicine

## 2024-01-31 MED ORDER — GLUCOSE BLOOD VI STRP: 1.0000 | ORAL_STRIP | Freq: Two times a day (BID) | 5 refills | Status: DC

## 2024-01-31 NOTE — Telephone Encounter (Signed)
 Copied from CRM #[id number]. Topic: Clinical - Prescription Issue >> Jan 31, 2024 11:22 AM Amy B wrote: Reason for CRM: Pharmacy is requesting prescription for patient's test strips to be refaxed and to make sure the medical diagnosis is documented as well for insurance purposes.

## 2024-01-31 NOTE — Telephone Encounter (Signed)
 Done

## 2024-02-05 ENCOUNTER — Other Ambulatory Visit: Payer: Self-pay | Admitting: *Deleted

## 2024-02-05 MED ORDER — GLUCOSE BLOOD VI STRP: 1.0000 | ORAL_STRIP | Freq: Every day | 2 refills | Status: AC

## 2024-02-11 ENCOUNTER — Telehealth: Payer: Self-pay

## 2024-02-11 NOTE — Transitions of Care (Post Inpatient/ED Visit) (Signed)
 02/11/2024  Name: Ellen Ryan MRN: [id number] DOB: 1956-01-05  Today's TOC FU Call Status: Today's TOC FU Call Status:: Successful TOC FU Call Completed TOC FU Call Complete Date: 02/11/24 Patient's Name and Date of Birth confirmed.  Transition Care Management Follow-up Telephone Call Date of Discharge: 02/09/24 Discharge Facility: Other [facility]) Name of Other (Non[facility]) Discharge Facility: 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 How have you been since you were released from the hospital?: Better Any questions or concerns?: No  Items Reviewed: Did you receive and understand the discharge instructions provided?: Yes Medications obtained,verified, and reconciled?: Yes (Medications Reviewed) Any new allergies since your discharge?: No Dietary orders reviewed?: NA Do you have support at home?: Yes People in Home [RPT]: sibling(s) Name of Support/Comfort Primary Source: [place]  Medications Reviewed Today: Medications Reviewed Today     Reviewed by Moises Reusing, RN (Case Manager) on 02/11/24 at 1431  Med List Status: <None>   Medication Order Taking? Sig Documenting Provider Last Dose Status Informant  acetaminophen  (TYLENOL ) 500 MG tablet [id number] No Take 500 mg by mouth every 6 (six) hours as needed. [provider] Unknown Active            Med Note CYNTHA, EVE   Mon Nov 05, 2023  3:31 PM)    amLODipine  (NORVASC ) 10 MG tablet [id number] No TAKE 1 TABLET BY MOUTH DAILY Randol Dawes, MD 12/26/2023 Evening Active   atorvastatin  (LIPITOR) 40 MG tablet [id number]  TAKE 1 TABLET BY MOUTH DAILY Randol Dawes, MD  Active   bisacodyl (DULCOLAX) 5 MG EC tablet [id number] No Take 5 mg by mouth every other day. [provider] Unknown Active            Med Note CYNTHA, EVE   Mon Nov 05, 2023  3:32 PM) She needs this every day, otherwise is constipated   Blood  Glucose Monitoring Suppl DEVI [id number] No 1 each by Does not apply route in the morning and at bedtime. May substitute to any manufacturer covered by patient's insurance. Randol Dawes, MD Unknown Active   estradiol  (ESTRACE  VAGINAL) 0.1 MG/GM vaginal cream [id number] No Apply a dime size amount of cream on your finger at bedtime and apply around the urinary opening on the vulva every night for the next month.  Patient not taking: Reported on 12/27/2023   Micheline Setter D, NP Not Taking Active   glucose blood test strip [id number]  1 each by Other route daily. Use as instructed Randol Dawes, MD  Active   ibuprofen  (ADVIL ) 600 MG tablet [id number] No Take 600 mg by mouth every 6 (six) hours as needed. [provider] 12/19/2023 Active            Med Note BEVERLEE LUCIENNE JULIANNA Pablo Aug 27, 2023  3:06 PM) As needed  Lancets JANETT [place] PLUS Luck) MISC [id number]  1 each by Does not apply route 2 (two) times daily. Randol Dawes, MD  Active   Melatonin 10 MG CAPS [id number] No Take 10 mg by mouth at bedtime as needed (sleep). [provider] Unknown Active Self, Pharmacy Records           Med Note BEVERLEE LUCIENNE JULIANNA Pablo Aug 27, 2023  3:05 PM) As needed  metFORMIN  (GLUCOPHAGE ) 500 MG tablet [id number]  TAKE 1 TABLET BY MOUTH 2 TIMES A DAY WITH A MEAL Randol Dawes, MD  Active   mirabegron  ER (MYRBETRIQ ) 50  MG TB24 tablet [id number] No Take 1 tablet by mouth daily. [provider] 12/26/2023 Evening Active   Multiple Vitamin (MULTIVITAMIN WITH MINERALS) TABS tablet [id number] No Take 1 tablet by mouth daily. Centrum [provider] Unknown Active Self, Pharmacy Records           Med Note LORNE, SHEFFIELD BROCKS   Tue Oct 31, 2022  8:24 AM)    Omega-3 Fatty Acids (FISH OIL) 1000 MG CPDR [id number] No Take 1 capsule by mouth daily. [provider] Unknown Active   ONETOUCH VERIO test strip [id number]  USE 1 STRIP TO CHECK GLUCOSE IN THE MORNING AND AT BEDTIME Randol Dawes, MD  Active    valsartan  (DIOVAN ) 40 MG tablet [id number]  TAKE 1 TABLET BY MOUTH DAILY Randol Dawes, MD  Active             Home Care and Equipment/Supplies: Were Home Health Services Ordered?: Yes Name of Home Health Agency:: [facility] Has Agency set up a time to come to your home?: No EMR reviewed for Home Health Orders: Orders present/patient has not received call (refer to CM for follow-up) Any new equipment or medical supplies ordered?: No  Functional Questionnaire: Do you need assistance with bathing/showering or dressing?: No Do you need assistance with meal preparation?: No Do you need assistance with eating?: No Do you have difficulty maintaining continence: Yes (Leaking urine around the catheter) Do you need assistance with getting out of bed/getting out of a chair/moving?: No Do you have difficulty managing or taking your medications?: No  Follow up appointments reviewed: PCP Follow-up appointment confirmed?: Yes Date of PCP follow-up appointment?: 02/13/24 Follow-up Provider: Dawes Randol Driscilla Lionel Follow-up appointment confirmed?: No Reason Specialist Follow-Up Not Confirmed: Patient has Specialist Provider Number and will Call for Appointment Do you need transportation to your follow-up appointment?: No Do you understand care options if your condition(s) worsen?: Yes-patient verbalized understanding  SDOH Interventions Today    Flowsheet Row Most Recent Value  SDOH Interventions   Food Insecurity Interventions Intervention Not Indicated  Housing Interventions Intervention Not Indicated  Transportation Interventions Intervention Not Indicated  Utilities Interventions Intervention Not Indicated    Medford Balboa, BSN, RN [place]  VBCI - [facility] RN Care Manager [phone]

## 2024-02-12 NOTE — Patient Instructions (Incomplete)
 RSV vaccine is recommended.  You need to get this from the pharmacy. Wait 2 weeks from today's vaccine before getting it.  I also recommend getting a COVID vaccine. You can get this from the pharmacy (along with the RSV vaccine, or on 2 separate days), or you can come to our office for a nurse visit for it. Vaccines should either be given the same day, or separated by 2 weeks.  Please schedule a routine diabetic eye exam. You can try calling [address] [facility] (off of [address]), or [employer], or [place] care.   Continue taking the stool softeners daily, as well as drinking plenty of water , eating a high fiber diet.  You may use Miralax if needed for any recurrent constipation--it does not sound like you need that now. Physical activity also helps keeps your bowels regular.  You can follow the instructions on your bottle regarding tylenol  dosing (like it says [phone] mg (1-2 tablets) every 6 hours as needed for pain if it is the extra strength tylenol .  You will need to check with your surgeon regarding your ibuprofen  question. Your kidney function is fine. I think the main concern would be if there is ongoing bleeding (since you bled a lot with the surgery and needed a transfusion). If you blood counts have improved, and no ongoing bleeding, I think they will say it is fine.  Check with the surgeon tomorrow (when they should be able to see today's lab results).

## 2024-02-12 NOTE — Progress Notes (Unsigned)
 No chief complaint on file.  Patient presents for hospital follow-up. She was hospitalized at [facility] on urology service from 10/15-18.  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 She required 1 U RBC transfusion, as Hgb drifted down to 7.1.  Was up to 8.5 on discharge on 10/18. She denies ongoing bleeding. ***  Component 02/09/24 02/08/24 02/07/24 02/07/24 02/06/24 02/06/24  HGB 8.5 Low  7.7 Low  7.1 Low  7.6 Low  9.2 Low  11.3  HCT 26.6 Low  23.7 Low  22.1 Low  23.7 Low  29.0 Low  35.7   Chem panels were normal in the hospital, other than elevated sugars.  Component 02/08/24 02/07/24 02/06/24 02/06/24    Sodium 145 140 144 145    Potassium 4.4 4.4 4.0 4.0    Chloride 104 102 103 101    CO2 28.9 27.9 26.2 29.1    Anion Gap 12 10 15  High  15 High     BUN 8 Low  10 9 10     Creatinine 0.60 0.73 0.53 Low  0.52 Low     BUN/Creatinine Ratio 13 14 17 19     eGFR CKD-EPI (2021) Female >90  89  >90  >90     Glucose 205 High  200 High  200 High  152 High     Calcium  9.2 8.9 8.9 9.7     She was discharged with urethral foley draining, suprapubic cath was capped, and will be removed in 3 weeks.  Diabetes has been well controlled on metformin  BID, with last A1c being 6.4% in May 2025 on this regimen. Sugars are running ***   Last diabetic eye exam was 2 years ago. ***    Hypertension, microalbuminuria:  She is taking amlodipine  10 mg and valsartan  40 mg daily, without side effects. She denies headaches, dizziness, palpitations, edema, chest pain or shortness of breath.    BP Readings from Last 3 Encounters:  12/27/23 134/77  11/27/23 122/80  11/05/23 120/80      Mixed hyperlipidemia:  She is compliant with atorvastatin  40 mg daily. She denies side effects. She tries to follow a low cholesterol diet.  She was not fasting in July when last lipids were checked, and TG was elevated.  LDL at goal  Lab Results  Component Value Date   CHOL 132  11/05/2023   HDL 47 11/05/2023   LDLCALC 49 11/05/2023   TRIG 229 (H) 11/05/2023   CHOLHDL 2.8 11/05/2023      Constipation-- At her physical she reported using dulcolax daily to control her constipation.  Previously didn't find miralax effective. Currently she reports ***  She had colonoscopy in September, with adenomatous polyps. 3 year f/u was recommended.    PMH, PSH, SH reviewed   All discharge mediations have been reviewed and reconciled with the most current medication list. Ellen Ryan has their current medications, including OTC medications listed in the chart. Any changes to the medications from today's visit will be noted in the assessment and plan section of this note.  ROS:    PHYSICAL EXAM:  There were no vitals taken for this visit.  Wt Readings from Last 3 Encounters:  12/27/23 168 lb (76.2 kg)  11/27/23 168 lb (76.2 kg)  11/05/23 167 lb 6.4 oz (75.9 kg)       ASSESSMENT/PLAN:  Need to reconcile meds from hospitalization Need to clarify her bowel regimen (esp since it may have changed with pain meds)  A1c Cbc  Flu shot COVID booster RSV from pharmacy if she hasn't gotten yet  Did she ever schedule eye exam? If so, get, if not, remind to.

## 2024-02-13 ENCOUNTER — Encounter: Payer: Self-pay | Admitting: Family Medicine

## 2024-02-13 ENCOUNTER — Ambulatory Visit (INDEPENDENT_AMBULATORY_CARE_PROVIDER_SITE_OTHER): Admitting: Family Medicine

## 2024-02-13 VITALS — BP 120/68 | HR 88 | Ht 59.0 in | Wt 170.4 lb

## 2024-02-13 DIAGNOSIS — E1169 Type 2 diabetes mellitus with other specified complication: Secondary | ICD-10-CM

## 2024-02-13 DIAGNOSIS — E785 Hyperlipidemia, unspecified: Secondary | ICD-10-CM

## 2024-02-13 DIAGNOSIS — N3592 Unspecified urethral stricture, female: Secondary | ICD-10-CM | POA: Diagnosis not present

## 2024-02-13 DIAGNOSIS — I10 Essential (primary) hypertension: Secondary | ICD-10-CM | POA: Diagnosis not present

## 2024-02-13 DIAGNOSIS — Z23 Encounter for immunization: Secondary | ICD-10-CM | POA: Diagnosis not present

## 2024-02-13 DIAGNOSIS — Z7185 Encounter for immunization safety counseling: Secondary | ICD-10-CM

## 2024-02-13 DIAGNOSIS — D649 Anemia, unspecified: Secondary | ICD-10-CM

## 2024-02-13 LAB — CBC WITH DIFFERENTIAL/PLATELET
Basophils Absolute: 0 x10E3/uL (ref 0.0–0.2)
Basos: 0 %
EOS (ABSOLUTE): 0.3 x10E3/uL (ref 0.0–0.4)
Eos: 2 %
Hematocrit: 26.8 % — ABNORMAL LOW (ref 34.0–46.6)
Hemoglobin: 7.9 g/dL — ABNORMAL LOW (ref 11.1–15.9)
Immature Grans (Abs): 0.1 x10E3/uL (ref 0.0–0.1)
Immature Granulocytes: 1 %
Lymphocytes Absolute: 3.7 x10E3/uL — ABNORMAL HIGH (ref 0.7–3.1)
Lymphs: 28 %
MCH: 22.8 pg — ABNORMAL LOW (ref 26.6–33.0)
MCHC: 29.5 g/dL — ABNORMAL LOW (ref 31.5–35.7)
MCV: 78 fL — ABNORMAL LOW (ref 79–97)
Monocytes Absolute: 1.1 x10E3/uL — ABNORMAL HIGH (ref 0.1–0.9)
Monocytes: 9 %
Neutrophils Absolute: 8.1 x10E3/uL — ABNORMAL HIGH (ref 1.4–7.0)
Neutrophils: 60 %
Platelets: 404 x10E3/uL (ref 150–450)
RBC: 3.46 x10E6/uL — ABNORMAL LOW (ref 3.77–5.28)
RDW: 15.1 % (ref 11.7–15.4)
WBC: 13.3 x10E3/uL — ABNORMAL HIGH (ref 3.4–10.8)

## 2024-02-13 LAB — POCT GLYCOSYLATED HEMOGLOBIN (HGB A1C): Hemoglobin A1C: 6.8 % — AB (ref 4.0–5.6)

## 2024-02-14 ENCOUNTER — Telehealth: Payer: Self-pay | Admitting: *Deleted

## 2024-02-14 ENCOUNTER — Ambulatory Visit: Payer: Self-pay | Admitting: Family Medicine

## 2024-02-14 NOTE — Telephone Encounter (Signed)
 Patients sister called wanting to speak with [place]. Advised she would need to contact Dr Drucie office per notes.

## 2024-02-14 NOTE — Telephone Encounter (Signed)
 Copied from CRM [id number]. Topic: Clinical - Lab/Test Results >> Feb 14, 2024 10:45 AM [place] J wrote: Reason for CRM: The patient's sister is calling to obtain lab results. I successfully relayed Dr. Pixie message. She will be contacting Dr. Levonia.   Noted and documented in original result note.

## 2024-02-22 ENCOUNTER — Other Ambulatory Visit: Payer: Self-pay | Admitting: Family Medicine

## 2024-02-22 DIAGNOSIS — I10 Essential (primary) hypertension: Secondary | ICD-10-CM

## 2024-02-22 DIAGNOSIS — E1169 Type 2 diabetes mellitus with other specified complication: Secondary | ICD-10-CM

## 2024-02-22 DIAGNOSIS — R809 Proteinuria, unspecified: Secondary | ICD-10-CM

## 2024-02-22 NOTE — Telephone Encounter (Signed)
 CRM [id number]  Communication Medication: amLODipine  (NORVASC ) 10 MG tablet metFORMIN  (GLUCOPHAGE ) 500 MG tablet ONETOUCH VERIO test strip Lancets (ONETOUCH DELICA PLUS LANCET33G) MISC  valsartan  (DIOVAN ) 40 MG tablet  atorvastatin  (LIPITOR) 40 MG tablet  Has the patient contacted their pharmacy? Yes  Pharmacy calling    This is the patient's preferred pharmacy:      [facility], [place] - [address]  [address]  [place] [address]  Phone: [phone] Fax: [phone]    Is this the correct pharmacy for this prescription? Yes  If no, delete pharmacy and type the correct one.    Has the prescription been filled recently? Yes    Is the patient out of the medication? Yes    Has the patient been seen for an appointment in the last year OR does the patient have an upcoming appointment? Yes    Can we respond through MyChart? No  Agent: Please be advised that Rx refills may take up to 3 business days. We ask that you follow-up with your pharmacy.

## 2024-02-25 NOTE — Telephone Encounter (Signed)
 These meds were sent in already

## 2024-02-26 ENCOUNTER — Other Ambulatory Visit: Payer: Self-pay | Admitting: Family Medicine

## 2024-02-26 NOTE — Telephone Encounter (Unsigned)
 Copied from CRM [id number]. Topic: Clinical - Medication Refill >> Feb 26, 2024  4:45 PM Selinda RAMAN wrote: Medication: Ellen Ryan test strip  Has the patient contacted their pharmacy? Yes   This is the patient's preferred pharmacy:    [facility], [place] - [address] [place] [address] Phone: [phone] Fax: [phone]  Is this the correct pharmacy for this prescription? Yes If no, delete pharmacy and type the correct one.   Has the prescription been filled recently? Yes but the patient told [facility] they did not get it at the local pharmacy where it was called in and want to get it from [facility]  Is the patient out of the medication? No  Has the patient been seen for an appointment in the last year OR does the patient have an upcoming appointment? Yes  Can we respond through MyChart? Yes  Please assist patient further

## 2024-02-27 MED ORDER — ONETOUCH VERIO VI STRP: ORAL_STRIP | 5 refills | Status: AC

## 2024-02-28 ENCOUNTER — Other Ambulatory Visit: Payer: Self-pay | Admitting: Family Medicine

## 2024-02-28 NOTE — Telephone Encounter (Unsigned)
 Copied from CRM #[id number]. Topic: Clinical - Medication Refill >> Feb 28, 2024  4:53 PM Joesph B wrote: Medication: docusate sodium  (COLACE) 100 MG capsule  acetaminophen  (TYLENOL ) 500 MG tablet [[id number]]  Omega-3 Fatty Acids (FISH OIL) 1000 MG CPDR [[id number]]  Multiple Vitamin (MULTIVITAMIN WITH MINERALS) TABS tablet [[id number]]  glucose blood (ONETOUCH VERIO) test strip [[id number]] (pharmacy did not receive)  Has the patient contacted their pharmacy? Yes (Agent: If no, request that the patient contact the pharmacy for the refill. If patient does not wish to contact the pharmacy document the reason why and proceed with request.) (Agent: If yes, when and what did the pharmacy advise?)  This is the patient's preferred pharmacy:    [facility], [place] - [address] [place] [address] Phone: [phone] Fax: [phone]  Is this the correct pharmacy for this prescription? Yes If no, delete pharmacy and type the correct one.   Has the prescription been filled recently? Yes  Is the patient out of the medication? Yes  Has the patient been seen for an appointment in the last year OR does the patient have an upcoming appointment? Yes  Can we respond through MyChart? Yes  Agent: Please be advised that Rx refills may take up to 3 business days. We ask that you follow-up with your pharmacy.

## 2024-02-29 NOTE — Telephone Encounter (Signed)
 Meds listed are all over the counter medications. Tylenol , Colace, Fish oil, Multiple Vitamin.  Test strip was sent in and confirmed with pharmacy on 02/27/24

## 2024-03-03 ENCOUNTER — Telehealth: Payer: Self-pay | Admitting: Family Medicine

## 2024-03-03 NOTE — Telephone Encounter (Signed)
 Copied from CRM [id number]. Topic: Clinical - Prescription Issue >> Mar 03, 2024 11:50 AM Joesph NOVAK wrote: Reason for CRM: [facility] is calling stating they have not received a prescription for the patients glucose test strips. It was confirmed on their end, but not received. Requesting for this prescription to be sent again.   [phone].

## 2024-03-03 NOTE — Telephone Encounter (Signed)
 Faxed

## 2024-03-19 ENCOUNTER — Encounter: Payer: Self-pay | Admitting: Gastroenterology

## 2024-03-19 ENCOUNTER — Ambulatory Visit: Admitting: Gastroenterology

## 2024-03-19 VITALS — BP 120/68 | HR 84 | Ht 58.75 in | Wt 168.5 lb

## 2024-03-19 DIAGNOSIS — K5909 Other constipation: Secondary | ICD-10-CM | POA: Diagnosis not present

## 2024-03-19 NOTE — Progress Notes (Signed)
 Chief Complaint: follow-up constipation Primary GI Doctor: Dr. Federico  HPI:  Patient is a  68  year old A.A female patient with past medical history of extramammary Paget's disease, s/p vulvectomy in 2018, DM, and hypertension, who was referred to me by Randol Dawes, MD on 08/27/23 for a evaluation of colon cancer screening, constipation.    Interval History Patient last seen in GI office on 12/27/23 by myself.    Patient presents for follow-up of constipation and colonoscopy results, accompanied by her sister who translates certain words for her. She reports she has continued to have issues with constipation. She reports the Linzess 72mcg po which didn't help much. She is currently taking Doculax which allows her to have small lumpy stools. The lower abdominal cramping has improved some.   Medical history update:suprapubic catheter has bene removed  No family history of GI issues or CA.  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    Past Surgical History:  Procedure Laterality Date   CO2 LASER APPLICATION N/A 10/19/2017   Procedure: CO2 LASER APPLICATION OF THE VULVA;  Surgeon: Eloy Herring, MD;  Location: [facility];  Service: Gynecology;  Laterality: N/A;   CO2 LASER APPLICATION N/A 09/21/2021   Procedure: VAGINOSCOPY,  VULVAR BIOPSIES;  Surgeon: Viktoria Comer SAUNDERS, MD;  Location: [facility];  Service: Gynecology;  Laterality: N/A;   CYSTOSCOPY  06/22/2022   Procedure: CYSTOSCOPY with BLADDER BIOPSY;  Surgeon: Marilynne Rosaline SAILOR, MD;  Location: [facility];  Service: Gynecology;;   EUA/ VULVA BIOPSY  09-01-2016  dr dove  [facility]   GRAFT APPLICATION  06/22/2022   Procedure: GRAFT APPLICATION;  Surgeon: Marilynne Rosaline SAILOR, MD;  Location: [facility];  Service: Gynecology;;   LABIOPLASTY N/A 11/01/2022   Procedure: lysis of adhesions of labia / vulva;  Surgeon: Viktoria Comer SAUNDERS, MD;  Location: [facility];  Service: Gynecology;  Laterality: N/A;   RADICAL VULVECTOMY N/A 09/21/2016   Procedure: RADICAL VULVECTOMY COMPLETE;  Surgeon: Eloy Herring, MD;  Location: [facility];  Service: Gynecology;  Laterality: N/A;   SUPRAPUBIC CATHETER INSERTION  07/2023   TRANSTHORACIC ECHOCARDIOGRAM  01/16/2017   ef 60-65%,  grade 1 diastolic dysfunction/  very mild AV stenosis with moderately thickened and calcified leaflets (valve area 1.85cm^2)/  moderate LAE   VULVA /PERINEUM BIOPSY N/A 09/13/2017   Procedure: VULVAR BIOPSY;  Surgeon: Eloy Herring, MD;  Location: [facility];  Service: Gynecology;  Laterality: N/A;   VULVA MARYBETH BIOPSY N/A 08/31/2021   Procedure: VULVAR BIOPSY;  Surgeon: Rogelio Planas, MD;  Location: [facility];  Service: Gynecology;  Laterality: N/A;   VULVECTOMY N/A 06/22/2022   Procedure: WIDE LOCAL EXCISION VULVECTOMY;  Surgeon: Viktoria Comer SAUNDERS, MD;  Location: [facility];  Service: Gynecology;  Laterality: N/A;    Current Outpatient Medications  Medication Sig Dispense Refill   acetaminophen  (TYLENOL ) 500 MG tablet Take 500 mg by mouth every 6 (six) hours as needed.     amLODipine  (NORVASC ) 10 MG tablet TAKE 1 TABLET BY MOUTH EVERY DAY *NEW PRESCRIPTION REQUEST* 90 tablet 1   atorvastatin  (LIPITOR) 40 MG  tablet TAKE 1 TABLET BY MOUTH EVERY DAY *NEW PRESCRIPTION REQUEST* 90 tablet 1   bisacodyl (DULCOLAX) 5 MG EC tablet Take 2 tablets by mouth as needed.     Blood Glucose Monitoring Suppl DEVI 1 each by Does not apply route in the morning and at bedtime. Aniella Wandrey substitute to any manufacturer covered by patient's insurance. 1 each 0   docusate sodium  (COLACE) 100 MG capsule Take 200 mg by mouth 2 (two) times daily. (Patient taking differently: Take 200 mg by mouth daily.)     glucose blood (ONETOUCH VERIO) test strip Use as instructed 100 each 5   glucose blood test strip 1 each by Other route daily. Use as instructed 100 each 2   ibuprofen  (ADVIL ) 600 MG tablet Take 600 mg by mouth every 6 (six) hours as needed.     Lancets (ONETOUCH DELICA PLUS LANCET33G) MISC TEST ONCE DAILY BEFORE BREAKFAST *NEW PRESCRIPTION REQUEST* 100 each 1   Melatonin 10 MG CAPS Take 10 mg by mouth at bedtime as needed (sleep).     metFORMIN  (GLUCOPHAGE ) 500 MG tablet TAKE 1 TABLET(S) BY MOUTH 2 TIMES PER DAY MORN AND EVE *NEW PRESCRIPTION REQUEST* 180 tablet 1   Multiple Vitamin (MULTIVITAMIN WITH MINERALS) TABS tablet Take 1 tablet by mouth daily. Centrum     Omega-3 Fatty Acids (FISH OIL) 1000 MG CPDR Take 1 capsule by mouth daily.     valsartan  (DIOVAN ) 40 MG tablet TAKE 1 TABLET BY MOUTH EVERY DAY *NEW PRESCRIPTION REQUEST* 90 tablet 1   estradiol  (ESTRACE  VAGINAL) 0.1 MG/GM vaginal cream Apply a dime size amount of cream on your finger at bedtime and apply around the urinary opening on the vulva every night for the next month. (Patient not taking: Reported on 03/19/2024) 42.5 g 5   mirabegron  ER (MYRBETRIQ ) 50 MG TB24 tablet Take 1 tablet by mouth daily. (Patient not taking: Reported on 03/19/2024)     No current facility-administered medications for this visit.    Allergies as of 03/19/2024 - Review Complete 03/19/2024  Allergen Reaction Noted   Gabapentin  Other (See Comments) 07/14/2022   Hydrocodone  Itching  07/14/2022    Family History  Problem Relation Age of Onset   Stroke Mother    Arthritis Mother    Diabetes Mother    Diabetes Father    Breast cancer Daughter        recurrent, and widely metastatic   Cancer Daughter    Colon cancer Neg Hx    Esophageal cancer Neg Hx    Liver disease Neg Hx    Rectal cancer Neg Hx    Stomach cancer Neg Hx     Review of Systems:    Constitutional: No weight loss, fever, chills, weakness or fatigue HEENT: Eyes: No change in vision               Ears, Nose, Throat:  No change in hearing or congestion Skin: No rash or itching Cardiovascular: No chest pain,  chest pressure or palpitations   Respiratory: No SOB or cough Gastrointestinal: See HPI and otherwise negative Genitourinary: No dysuria or change in urinary frequency Neurological: No headache, dizziness or syncope Musculoskeletal: No new muscle or joint pain Hematologic: No bleeding or bruising Psychiatric: No history of depression or anxiety    Physical Exam:  Vital signs: BP 120/68 (BP Location: Left Arm, Patient Position: Sitting, Cuff Size: Large)   Pulse 84   Ht 4' 10.75 (1.492 m) Comment: height measured without shoes  Wt 168 lb 8 oz (76.4 kg)   BMI 34.32 kg/m   Constitutional:   Pleasant A.A. female appears to be in NAD, Well developed, Well nourished, alert and cooperative Eyes:   PEERL, EOMI. No icterus. Conjunctiva pink. Neck:  Supple Throat: Oral cavity and pharynx without inflammation, swelling or lesion.  Respiratory: Respirations even and unlabored. Lungs clear to auscultation bilaterally.   No wheezes, crackles, or rhonchi.  Cardiovascular: Normal S1, S2. Regular rate and rhythm. No peripheral edema, cyanosis or pallor.  Gastrointestinal:  Soft, distended, nontender. No rebound or guarding. Hypoactive bowel sounds. No appreciable masses or hepatomegaly. Rectal:  Not performed.  Msk:  Symmetrical without gross deformities. Without edema, no deformity or joint  abnormality.  Neurologic:  Alert and  oriented x4;  grossly normal neurologically.  Skin:   Dry and intact without significant lesions or rashes.  RELEVANT LABS AND IMAGING: CBC    Latest Ref Rng & Units 02/13/2024    4:27 PM 11/05/2023    4:06 PM 01/08/2023   12:09 PM  CBC  WBC 3.4 - 10.8 x10E3/uL 13.3  9.3  8.3   Hemoglobin 11.1 - 15.9 g/dL 7.9  88.1  89.3   Hematocrit 34.0 - 46.6 % 26.8  40.1  33.4   Platelets 150 - 450 x10E3/uL 404  321  354      CMP     Latest Ref Rng & Units 11/05/2023    4:06 PM 01/24/2023   12:50 PM 01/08/2023   12:09 PM  CMP  Glucose 70 - 99 mg/dL 866  94  887   BUN 8 - 27 mg/dL 10  10  8    Creatinine 0.57 - 1.00 mg/dL 9.33  9.36  9.37   Sodium 134 - 144 mmol/L 141  138  125   Potassium 3.5 - 5.2 mmol/L 3.9  4.1  3.8   Chloride 96 - 106 mmol/L 99  99  91   CO2 20 - 29 mmol/L 21  24  26    Calcium  8.7 - 10.3 mg/dL 89.5  9.9  9.7   Total Protein 6.0 - 8.5 g/dL 7.9     Total Bilirubin 0.0 - 1.2 mg/dL 0.3     Alkaline Phos 44 - 121 IU/L 90     AST 0 - 40 IU/L 33     ALT 0 - 32 IU/L 33        Lab Results  Component Value Date   TSH 1.110 11/05/2023  12/2016 echo- The estimated ejection fraction was in the range of 60% to 65%.   12/2022 CTAP: IMPRESSION: Heterogeneous enhancement of the right kidney with some stranding. Please correlate for history of pyelonephritis or other process. Recommend follow-up. Foley catheter in the bladder which is only mildly distended. There is significant wall thickening. Fatty liver infiltration. Stable surgical changes in the pelvis as per provided history.  GI procedures: 12/27/23 colonoscopy,recall 3 years  - The examined portion of the ileum was normal. - Four 3  to 7 mm polyps in the transverse colon, in the ascending colon and in the cecum, removed with a cold snare. Resected and retrieved. - Two 3 to 5 mm polyps in the rectum and in the descending colon, removed with a cold snare. Resected and retrieved. - Non-  bleeding internal hemorrhoids. Path: 1. Surgical [P], colon, cecum, ascending, transverse, polyp (4) :       - TUBULAR ADENOMA(S)       - NEGATIVE FOR HIGH-GRADE DYSPLASIA OR MALIGNANCY        2. Surgical [P], colon, descending, rectal, polyp (2) :       - HYPERPLASTIC POLYP       - BENIGN FIBROBLASTIC POLYP    Assessment: Encounter Diagnosis  Name Primary?   Chronic constipation Yes      68 year old A. A. female patient who presents for follow-up for constipation not improved with over-the-counter MiraLAX or Linzess 72 mcg po daily.   Will go ahead and provide samples of Linzess 145 mcg p.o. daily to see if this helps regulate her bowels.  Also recommended high-fiber diet and drinking plenty of fluid. Colonoscopy 9/25 with (6) colonic polyps - 4 TA, 2 HPP. Recommend her children be screened at age 18.   Plan: - Recommend high fiber diet  -Samples of Linzess 145 mcg po daily  - recall colonoscopy, 12/2026 -follow-up with Dr. Federico 3-4 mths   Thank you for the courtesy of this consult. Please call me with any questions or concerns.   Loral Campi, FNP-C [facility] 03/19/2024, 9:39 AM  Cc: Randol Dawes, MD

## 2024-03-19 NOTE — Patient Instructions (Addendum)
 Recommend high fiber diet Linzess 145mcg- take 1 tab 30-45 mins before breakfast with full glass of water  Call offfice for prescription if it works well.   _______________________________________________________  If your blood pressure at your visit was 140/90 or greater, please contact your primary care physician to follow up on this.  _______________________________________________________  If you are age 70 or older, your body mass index should be between 23-30. Your Body mass index is 34.32 kg/m. If this is out of the aforementioned range listed, please consider follow up with your Primary Care Provider.  If you are age 106 or younger, your body mass index should be between 19-25. Your Body mass index is 34.32 kg/m. If this is out of the aformentioned range listed, please consider follow up with your Primary Care Provider.   ________________________________________________________  The [facility] providers would like to encourage you to use MYCHART to communicate with providers for non-urgent requests or questions.  Due to long hold times on the telephone, sending your provider a message by [facility] may be a faster and more efficient way to get a response.  Please allow 48 business hours for a response.  Please remember that this is for non-urgent requests.  _______________________________________________________  [facility] is using a team-based approach to care.  Your team is made up of your doctor and two to three APPS. Our APPS (Nurse Practitioners and Physician Assistants) work with your physician to ensure care continuity for you. They are fully qualified to address your health concerns and develop a treatment plan. They communicate directly with your gastroenterologist to care for you. Seeing the Advanced Practice Practitioners on your physician's team can help you by facilitating care more promptly, often allowing for earlier appointments, access to diagnostic testing,  procedures, and other specialty referrals.   Thank you for trusting me with your gastrointestinal care. Deanna May, FNP-C

## 2024-04-07 NOTE — Progress Notes (Signed)
 Agree with the assessment and plan as outlined by [facility], FNP-C.    Ginni Eichler E. Tomasa Rand, MD [facility]

## 2024-04-11 ENCOUNTER — Telehealth: Payer: Self-pay | Admitting: Gastroenterology

## 2024-04-11 NOTE — Telephone Encounter (Signed)
 [facility]bound [facility] requesting prescription for linzess faxed to [phone]. Please advise, thank you

## 2024-04-16 ENCOUNTER — Other Ambulatory Visit: Payer: Self-pay | Admitting: Family Medicine

## 2024-04-16 DIAGNOSIS — E1169 Type 2 diabetes mellitus with other specified complication: Secondary | ICD-10-CM

## 2024-04-16 DIAGNOSIS — R809 Proteinuria, unspecified: Secondary | ICD-10-CM

## 2024-05-05 ENCOUNTER — Encounter: Payer: Self-pay | Admitting: *Deleted

## 2024-11-06 ENCOUNTER — Ambulatory Visit: Payer: Self-pay | Admitting: Family Medicine
